# Patient Record
Sex: Male | Born: 1940 | Race: White | Hispanic: No | Marital: Married | Smoking: Former smoker
Health system: Southern US, Community
[De-identification: ages and names within clinical notes are randomized; demographics above are authoritative.]

## PROBLEM LIST (undated history)

## (undated) DIAGNOSIS — C801 Malignant (primary) neoplasm, unspecified: Secondary | ICD-10-CM

## (undated) DIAGNOSIS — Z87442 Personal history of urinary calculi: Secondary | ICD-10-CM

## (undated) DIAGNOSIS — E039 Hypothyroidism, unspecified: Secondary | ICD-10-CM

## (undated) DIAGNOSIS — I619 Nontraumatic intracerebral hemorrhage, unspecified: Secondary | ICD-10-CM

## (undated) DIAGNOSIS — M1990 Unspecified osteoarthritis, unspecified site: Secondary | ICD-10-CM

## (undated) DIAGNOSIS — M549 Dorsalgia, unspecified: Secondary | ICD-10-CM

## (undated) DIAGNOSIS — R011 Cardiac murmur, unspecified: Secondary | ICD-10-CM

## (undated) DIAGNOSIS — F0390 Unspecified dementia without behavioral disturbance: Secondary | ICD-10-CM

## (undated) HISTORY — DX: Dorsalgia, unspecified: M54.9

## (undated) HISTORY — PX: TONSILLECTOMY: SUR1361

## (undated) HISTORY — PX: INGUINAL HERNIA REPAIR: SUR1180

## (undated) HISTORY — DX: Nontraumatic intracerebral hemorrhage, unspecified: I61.9

## (undated) HISTORY — PX: BACK SURGERY: SHX140

## (undated) HISTORY — PX: NECK SURGERY: SHX720

---

## 2014-05-12 DIAGNOSIS — X32XXXA Exposure to sunlight, initial encounter: Secondary | ICD-10-CM | POA: Diagnosis not present

## 2014-05-12 DIAGNOSIS — L570 Actinic keratosis: Secondary | ICD-10-CM | POA: Diagnosis not present

## 2014-05-12 DIAGNOSIS — L821 Other seborrheic keratosis: Secondary | ICD-10-CM | POA: Diagnosis not present

## 2014-05-19 DIAGNOSIS — M19041 Primary osteoarthritis, right hand: Secondary | ICD-10-CM | POA: Diagnosis not present

## 2014-05-19 DIAGNOSIS — M19042 Primary osteoarthritis, left hand: Secondary | ICD-10-CM | POA: Diagnosis not present

## 2014-05-19 DIAGNOSIS — M65322 Trigger finger, left index finger: Secondary | ICD-10-CM | POA: Diagnosis not present

## 2014-05-31 DIAGNOSIS — R4182 Altered mental status, unspecified: Secondary | ICD-10-CM | POA: Diagnosis not present

## 2014-05-31 DIAGNOSIS — R410 Disorientation, unspecified: Secondary | ICD-10-CM | POA: Diagnosis not present

## 2014-05-31 DIAGNOSIS — E860 Dehydration: Secondary | ICD-10-CM | POA: Diagnosis not present

## 2014-05-31 DIAGNOSIS — G454 Transient global amnesia: Secondary | ICD-10-CM | POA: Diagnosis not present

## 2014-07-12 DIAGNOSIS — G5601 Carpal tunnel syndrome, right upper limb: Secondary | ICD-10-CM | POA: Diagnosis not present

## 2014-07-12 DIAGNOSIS — G061 Intraspinal abscess and granuloma: Secondary | ICD-10-CM | POA: Diagnosis not present

## 2014-07-12 DIAGNOSIS — M5032 Other cervical disc degeneration, mid-cervical region: Secondary | ICD-10-CM | POA: Diagnosis not present

## 2014-07-12 DIAGNOSIS — G959 Disease of spinal cord, unspecified: Secondary | ICD-10-CM | POA: Diagnosis not present

## 2014-07-12 DIAGNOSIS — Z981 Arthrodesis status: Secondary | ICD-10-CM | POA: Diagnosis not present

## 2014-07-26 DIAGNOSIS — H04123 Dry eye syndrome of bilateral lacrimal glands: Secondary | ICD-10-CM | POA: Diagnosis not present

## 2014-07-26 DIAGNOSIS — H524 Presbyopia: Secondary | ICD-10-CM | POA: Diagnosis not present

## 2014-09-10 DIAGNOSIS — M65312 Trigger thumb, left thumb: Secondary | ICD-10-CM | POA: Diagnosis not present

## 2014-10-13 DIAGNOSIS — D485 Neoplasm of uncertain behavior of skin: Secondary | ICD-10-CM | POA: Diagnosis not present

## 2014-10-13 DIAGNOSIS — L821 Other seborrheic keratosis: Secondary | ICD-10-CM | POA: Diagnosis not present

## 2015-01-19 DIAGNOSIS — E785 Hyperlipidemia, unspecified: Secondary | ICD-10-CM | POA: Diagnosis not present

## 2015-01-19 DIAGNOSIS — Z5181 Encounter for therapeutic drug level monitoring: Secondary | ICD-10-CM | POA: Diagnosis not present

## 2015-01-19 DIAGNOSIS — Z125 Encounter for screening for malignant neoplasm of prostate: Secondary | ICD-10-CM | POA: Diagnosis not present

## 2015-01-19 DIAGNOSIS — Z23 Encounter for immunization: Secondary | ICD-10-CM | POA: Diagnosis not present

## 2015-02-18 DIAGNOSIS — Z0000 Encounter for general adult medical examination without abnormal findings: Secondary | ICD-10-CM | POA: Diagnosis not present

## 2015-02-18 DIAGNOSIS — R0982 Postnasal drip: Secondary | ICD-10-CM | POA: Diagnosis not present

## 2015-02-18 DIAGNOSIS — R001 Bradycardia, unspecified: Secondary | ICD-10-CM | POA: Diagnosis not present

## 2015-02-18 DIAGNOSIS — Z125 Encounter for screening for malignant neoplasm of prostate: Secondary | ICD-10-CM | POA: Diagnosis not present

## 2015-02-18 DIAGNOSIS — R011 Cardiac murmur, unspecified: Secondary | ICD-10-CM | POA: Diagnosis not present

## 2015-06-15 DIAGNOSIS — M65322 Trigger finger, left index finger: Secondary | ICD-10-CM | POA: Diagnosis not present

## 2015-06-28 DIAGNOSIS — L981 Factitial dermatitis: Secondary | ICD-10-CM | POA: Diagnosis not present

## 2015-06-28 DIAGNOSIS — D0472 Carcinoma in situ of skin of left lower limb, including hip: Secondary | ICD-10-CM | POA: Diagnosis not present

## 2015-06-28 DIAGNOSIS — D1801 Hemangioma of skin and subcutaneous tissue: Secondary | ICD-10-CM | POA: Diagnosis not present

## 2015-06-28 DIAGNOSIS — X32XXXA Exposure to sunlight, initial encounter: Secondary | ICD-10-CM | POA: Diagnosis not present

## 2015-06-28 DIAGNOSIS — L570 Actinic keratosis: Secondary | ICD-10-CM | POA: Diagnosis not present

## 2015-07-11 DIAGNOSIS — L570 Actinic keratosis: Secondary | ICD-10-CM | POA: Diagnosis not present

## 2015-07-29 DIAGNOSIS — G5601 Carpal tunnel syndrome, right upper limb: Secondary | ICD-10-CM | POA: Diagnosis not present

## 2015-07-29 DIAGNOSIS — G061 Intraspinal abscess and granuloma: Secondary | ICD-10-CM | POA: Diagnosis not present

## 2015-08-12 DIAGNOSIS — L570 Actinic keratosis: Secondary | ICD-10-CM | POA: Diagnosis not present

## 2015-08-12 DIAGNOSIS — X32XXXA Exposure to sunlight, initial encounter: Secondary | ICD-10-CM | POA: Diagnosis not present

## 2015-09-14 DIAGNOSIS — H52223 Regular astigmatism, bilateral: Secondary | ICD-10-CM | POA: Diagnosis not present

## 2015-09-14 DIAGNOSIS — H524 Presbyopia: Secondary | ICD-10-CM | POA: Diagnosis not present

## 2015-09-14 DIAGNOSIS — H2513 Age-related nuclear cataract, bilateral: Secondary | ICD-10-CM | POA: Diagnosis not present

## 2015-09-14 DIAGNOSIS — H5203 Hypermetropia, bilateral: Secondary | ICD-10-CM | POA: Diagnosis not present

## 2016-01-02 DIAGNOSIS — Z136 Encounter for screening for cardiovascular disorders: Secondary | ICD-10-CM | POA: Diagnosis not present

## 2016-01-02 DIAGNOSIS — Z1211 Encounter for screening for malignant neoplasm of colon: Secondary | ICD-10-CM | POA: Diagnosis not present

## 2016-01-02 DIAGNOSIS — Z23 Encounter for immunization: Secondary | ICD-10-CM | POA: Diagnosis not present

## 2016-01-02 DIAGNOSIS — Z0000 Encounter for general adult medical examination without abnormal findings: Secondary | ICD-10-CM | POA: Diagnosis not present

## 2016-01-02 DIAGNOSIS — M6530 Trigger finger, unspecified finger: Secondary | ICD-10-CM | POA: Diagnosis not present

## 2016-01-02 DIAGNOSIS — M25561 Pain in right knee: Secondary | ICD-10-CM | POA: Diagnosis not present

## 2016-01-02 DIAGNOSIS — Z125 Encounter for screening for malignant neoplasm of prostate: Secondary | ICD-10-CM | POA: Diagnosis not present

## 2016-01-02 DIAGNOSIS — Z131 Encounter for screening for diabetes mellitus: Secondary | ICD-10-CM | POA: Diagnosis not present

## 2016-01-05 DIAGNOSIS — M1812 Unilateral primary osteoarthritis of first carpometacarpal joint, left hand: Secondary | ICD-10-CM | POA: Diagnosis not present

## 2016-01-05 DIAGNOSIS — M1711 Unilateral primary osteoarthritis, right knee: Secondary | ICD-10-CM | POA: Diagnosis not present

## 2016-01-05 DIAGNOSIS — M11261 Other chondrocalcinosis, right knee: Secondary | ICD-10-CM | POA: Diagnosis not present

## 2016-01-05 DIAGNOSIS — M65312 Trigger thumb, left thumb: Secondary | ICD-10-CM | POA: Diagnosis not present

## 2016-02-06 DIAGNOSIS — Z1211 Encounter for screening for malignant neoplasm of colon: Secondary | ICD-10-CM | POA: Diagnosis not present

## 2016-04-30 DIAGNOSIS — M1711 Unilateral primary osteoarthritis, right knee: Secondary | ICD-10-CM | POA: Diagnosis not present

## 2016-05-31 DIAGNOSIS — J329 Chronic sinusitis, unspecified: Secondary | ICD-10-CM | POA: Diagnosis not present

## 2016-05-31 DIAGNOSIS — R42 Dizziness and giddiness: Secondary | ICD-10-CM | POA: Diagnosis not present

## 2016-08-06 DIAGNOSIS — M1711 Unilateral primary osteoarthritis, right knee: Secondary | ICD-10-CM | POA: Diagnosis not present

## 2016-08-06 DIAGNOSIS — M5136 Other intervertebral disc degeneration, lumbar region: Secondary | ICD-10-CM | POA: Diagnosis not present

## 2016-08-06 DIAGNOSIS — M545 Low back pain: Secondary | ICD-10-CM | POA: Diagnosis not present

## 2016-08-08 DIAGNOSIS — M545 Low back pain: Secondary | ICD-10-CM | POA: Diagnosis not present

## 2016-08-10 DIAGNOSIS — M545 Low back pain: Secondary | ICD-10-CM | POA: Diagnosis not present

## 2016-08-14 DIAGNOSIS — M545 Low back pain: Secondary | ICD-10-CM | POA: Diagnosis not present

## 2016-08-21 DIAGNOSIS — M545 Low back pain: Secondary | ICD-10-CM | POA: Diagnosis not present

## 2016-08-24 DIAGNOSIS — M545 Low back pain: Secondary | ICD-10-CM | POA: Diagnosis not present

## 2016-08-28 DIAGNOSIS — M545 Low back pain: Secondary | ICD-10-CM | POA: Diagnosis not present

## 2016-08-31 DIAGNOSIS — M545 Low back pain: Secondary | ICD-10-CM | POA: Diagnosis not present

## 2016-09-03 DIAGNOSIS — Q828 Other specified congenital malformations of skin: Secondary | ICD-10-CM | POA: Diagnosis not present

## 2016-09-03 DIAGNOSIS — H61002 Unspecified perichondritis of left external ear: Secondary | ICD-10-CM | POA: Diagnosis not present

## 2016-09-03 DIAGNOSIS — L603 Nail dystrophy: Secondary | ICD-10-CM | POA: Diagnosis not present

## 2016-09-03 DIAGNOSIS — L570 Actinic keratosis: Secondary | ICD-10-CM | POA: Diagnosis not present

## 2016-09-03 DIAGNOSIS — D1801 Hemangioma of skin and subcutaneous tissue: Secondary | ICD-10-CM | POA: Diagnosis not present

## 2016-09-03 DIAGNOSIS — L821 Other seborrheic keratosis: Secondary | ICD-10-CM | POA: Diagnosis not present

## 2016-09-03 DIAGNOSIS — D485 Neoplasm of uncertain behavior of skin: Secondary | ICD-10-CM | POA: Diagnosis not present

## 2016-09-19 DIAGNOSIS — M545 Low back pain: Secondary | ICD-10-CM | POA: Diagnosis not present

## 2016-09-21 DIAGNOSIS — H5202 Hypermetropia, left eye: Secondary | ICD-10-CM | POA: Diagnosis not present

## 2016-09-21 DIAGNOSIS — H2513 Age-related nuclear cataract, bilateral: Secondary | ICD-10-CM | POA: Diagnosis not present

## 2016-09-21 DIAGNOSIS — H52223 Regular astigmatism, bilateral: Secondary | ICD-10-CM | POA: Diagnosis not present

## 2016-10-02 DIAGNOSIS — M5136 Other intervertebral disc degeneration, lumbar region: Secondary | ICD-10-CM | POA: Diagnosis not present

## 2016-10-02 DIAGNOSIS — M1711 Unilateral primary osteoarthritis, right knee: Secondary | ICD-10-CM | POA: Diagnosis not present

## 2016-10-02 DIAGNOSIS — M11261 Other chondrocalcinosis, right knee: Secondary | ICD-10-CM | POA: Diagnosis not present

## 2016-10-29 DIAGNOSIS — L905 Scar conditions and fibrosis of skin: Secondary | ICD-10-CM | POA: Diagnosis not present

## 2016-10-29 DIAGNOSIS — H61002 Unspecified perichondritis of left external ear: Secondary | ICD-10-CM | POA: Diagnosis not present

## 2016-10-30 DIAGNOSIS — L905 Scar conditions and fibrosis of skin: Secondary | ICD-10-CM | POA: Diagnosis not present

## 2016-10-30 DIAGNOSIS — H61002 Unspecified perichondritis of left external ear: Secondary | ICD-10-CM | POA: Diagnosis not present

## 2016-11-28 DIAGNOSIS — M1711 Unilateral primary osteoarthritis, right knee: Secondary | ICD-10-CM | POA: Diagnosis not present

## 2016-12-27 DIAGNOSIS — M1711 Unilateral primary osteoarthritis, right knee: Secondary | ICD-10-CM | POA: Diagnosis not present

## 2017-01-08 DIAGNOSIS — Z23 Encounter for immunization: Secondary | ICD-10-CM | POA: Diagnosis not present

## 2017-01-08 DIAGNOSIS — Z0000 Encounter for general adult medical examination without abnormal findings: Secondary | ICD-10-CM | POA: Diagnosis not present

## 2017-01-08 DIAGNOSIS — E785 Hyperlipidemia, unspecified: Secondary | ICD-10-CM | POA: Diagnosis not present

## 2017-01-08 DIAGNOSIS — Z125 Encounter for screening for malignant neoplasm of prostate: Secondary | ICD-10-CM | POA: Diagnosis not present

## 2017-01-29 DIAGNOSIS — M1711 Unilateral primary osteoarthritis, right knee: Secondary | ICD-10-CM | POA: Diagnosis not present

## 2017-02-04 DIAGNOSIS — L570 Actinic keratosis: Secondary | ICD-10-CM | POA: Diagnosis not present

## 2017-02-05 DIAGNOSIS — M1711 Unilateral primary osteoarthritis, right knee: Secondary | ICD-10-CM | POA: Diagnosis not present

## 2017-02-12 DIAGNOSIS — M1711 Unilateral primary osteoarthritis, right knee: Secondary | ICD-10-CM | POA: Diagnosis not present

## 2017-02-28 DIAGNOSIS — M1711 Unilateral primary osteoarthritis, right knee: Secondary | ICD-10-CM | POA: Diagnosis not present

## 2017-03-20 DIAGNOSIS — M1711 Unilateral primary osteoarthritis, right knee: Secondary | ICD-10-CM | POA: Diagnosis not present

## 2017-03-29 DIAGNOSIS — R011 Cardiac murmur, unspecified: Secondary | ICD-10-CM | POA: Diagnosis not present

## 2017-03-29 DIAGNOSIS — Z01818 Encounter for other preprocedural examination: Secondary | ICD-10-CM | POA: Diagnosis not present

## 2017-03-29 DIAGNOSIS — M179 Osteoarthritis of knee, unspecified: Secondary | ICD-10-CM | POA: Diagnosis not present

## 2017-04-03 ENCOUNTER — Telehealth: Payer: Self-pay

## 2017-04-03 NOTE — Telephone Encounter (Signed)
SENT NOTES TO SCHEDULING 

## 2017-04-05 NOTE — Progress Notes (Signed)
Referring-Dr Orland Mustard Reason for referral-murmur and preoperative evaluation  HPI: 76 year old male for evaluation of murmur and preoperative evaluation at the request of Dr. Orland Mustard. Patient is scheduled for knee replacement. We were asked to evaluate prior to this. He developed knee arthralgias in July. This has limited his mobility somewhat. However prior to that he was walking 4 miles daily with no symptoms. He denies dyspnea on exertion, orthopnea, PND, pedal edema, palpitations, syncope or chest pain.  Current Outpatient Medications  Medication Sig Dispense Refill  . acetaminophen (TYLENOL 8 HOUR) 650 MG CR tablet Take 650 mg by mouth every 8 (eight) hours as needed for pain.    . Ibuprofen (IBU-200 PO) Take 200 mg by mouth as needed.    . naproxen (NAPROSYN) 500 MG tablet Take 500 mg by mouth 2 (two) times daily.  2  . omeprazole (PRILOSEC) 20 MG capsule Take 20 mg by mouth 2 (two) times daily.  2   No current facility-administered medications for this visit.     Not on File   Past Medical History:  Diagnosis Date  . Back pain   . ICH (intracerebral hemorrhage) ([place])     Past Surgical History:  Procedure Laterality Date  . BACK SURGERY    . INGUINAL HERNIA REPAIR    . NECK SURGERY    . TONSILLECTOMY      Social History   Socioeconomic History  . Marital status: Married    Spouse name: Not on file  . Number of children: 3  . Years of education: Not on file  . Highest education level: Not on file  Social Needs  . Financial resource strain: Not on file  . Food insecurity - worry: Not on file  . Food insecurity - inability: Not on file  . Transportation needs - medical: Not on file  . Transportation needs - non-medical: Not on file  Occupational History    Comment: Retired  Tobacco Use  . Smoking status: Former Research scientist (life sciences)  . Smokeless tobacco: Never Used  Substance and Sexual Activity  . Alcohol use: Yes    Frequency: Never    Comment: one beer per day  . Drug  use: No  . Sexual activity: Not on file  Other Topics Concern  . Not on file  Social History Narrative  . Not on file    Family History  Problem Relation Age of Onset  . Diabetes Mother     ROS:  Knee arthralgias but no fevers or chills, productive cough, hemoptysis, dysphasia, odynophagia, melena, hematochezia, dysuria, hematuria, rash, seizure activity, orthopnea, PND, pedal edema, claudication. Remaining systems are negative.  Physical Exam:   Blood pressure (!) 148/80, pulse 62, height 5\' 7"  (1.702 m), weight 201 lb (91.2 kg).  General:  Well developed/well nourished in NAD Skin warm/dry Patient not depressed No peripheral clubbing Back-normal HEENT-normal/normal eyelids Neck supple/normal carotid upstroke bilaterally; no bruits; no JVD; no thyromegaly chest - CTA/ normal expansion CV - RRR/normal S1 and S2; no rubs or gallops;  PMI nondisplaced, 2/6 systolic murmur left sternal border. S2 is not diminished. Abdomen -NT/ND, no HSM, no mass, + bowel sounds, positive bruit 2+ femoral pulses, no bruits Ext-no edema, chords, 2+ DP Neuro-grossly nonfocal  ECG - sinus rhythm at a rate of 62. No significant ST changes.personally reviewed  A/P  1 preoperative evaluation prior to knee replacement-we will arrange an echocardiogram to rule out significant valvular disease. He otherwise has a history of excellent functional capacity with no symptoms.  He does not have risk factors of diabetes mellitus, hypertension, hyperlipidemia, tobacco abuse or family history. If echocardiogram unremarkable he does not require further ischemia evaluation and may proceed with surgery.  2 murmur-probable aortic stenosis murmur. However S2 is not diminished. Unlikely to be severe. Echocardiogram to further assess.  3 abdominal bruit-schedule ultrasound to exclude aneurysm.  4 elevated blood pressure-blood pressure mildly elevated today but typically controlled at home and no history of  hypertension. We will follow for now.  5 Knee arthralgias-per orthopedic surgery  Kirk Ruths, MD

## 2017-04-09 ENCOUNTER — Telehealth: Payer: Self-pay

## 2017-04-09 NOTE — Telephone Encounter (Signed)
Ov 04/18/17 for clearance

## 2017-04-09 NOTE — Telephone Encounter (Signed)
I have never seen this pt; will need ov prior to surgery with pa or cardiologist Kirk Ruths

## 2017-04-09 NOTE — Telephone Encounter (Signed)
Pt has New pt appt with Dr Stanford Breed 04-18-2017     [facility] Pre-operative Risk Assessment    Request for surgical clearance:  1. What type of surgery is being performed? RIGHT KNEE: RIGHT TKA w/NAVIGATION, CAS IMAGE-LESS  2. When is this surgery scheduled? NOT SCHEDULED   3. Are there any medications that need to be held prior to surgery and how long?NO COAGULANTS-TO THEIR KNOWLEDGE    4. Practice name and name of physician performing surgery?  [facility]-Dr [employer]  5. What is your office phone and fax number? [phone] FAX [phone]   6. Anesthesia type (None, local, MAC, general) ? SPINAL    Patrick Collier 04/09/2017, 3:37 PM  _________________________________________________________________   (provider comments below)

## 2017-04-18 ENCOUNTER — Encounter (INDEPENDENT_AMBULATORY_CARE_PROVIDER_SITE_OTHER): Payer: Self-pay

## 2017-04-18 ENCOUNTER — Encounter: Payer: Self-pay | Admitting: Cardiology

## 2017-04-18 ENCOUNTER — Ambulatory Visit (INDEPENDENT_AMBULATORY_CARE_PROVIDER_SITE_OTHER): Payer: Medicare Other | Admitting: Cardiology

## 2017-04-18 VITALS — BP 148/80 | HR 62 | Ht 67.0 in | Wt 201.0 lb

## 2017-04-18 DIAGNOSIS — Z87891 Personal history of nicotine dependence: Secondary | ICD-10-CM

## 2017-04-18 DIAGNOSIS — R011 Cardiac murmur, unspecified: Secondary | ICD-10-CM

## 2017-04-18 DIAGNOSIS — R0989 Other specified symptoms and signs involving the circulatory and respiratory systems: Secondary | ICD-10-CM | POA: Diagnosis not present

## 2017-04-18 DIAGNOSIS — Z01818 Encounter for other preprocedural examination: Secondary | ICD-10-CM | POA: Diagnosis not present

## 2017-04-18 NOTE — Patient Instructions (Signed)
Medication Instructions:   NO CHANGE  Testing/Procedures:  Your physician has requested that you have an echocardiogram. Echocardiography is a painless test that uses sound waves to create images of your heart. It provides your doctor with information about the size and shape of your heart and how well your heart's chambers and valves are working. This procedure takes approximately one hour. There are no restrictions for this procedure.SCHEDULE ASAP FOR PRE-OP TESTING  Your physician has requested that you have an abdominal aorta duplex. During this test, an ultrasound is used to evaluate the aorta. Allow 30 minutes for this exam. Do not eat after midnight the day before and avoid carbonated beverages     Follow-Up:  Your physician wants you to follow-up in: [place] will receive a reminder letter in the mail two months in advance. If you don't receive a letter, please call our office to schedule the follow-up appointment.

## 2017-04-19 ENCOUNTER — Ambulatory Visit (HOSPITAL_COMMUNITY): Payer: Medicare Other | Attending: Internal Medicine

## 2017-04-19 ENCOUNTER — Other Ambulatory Visit: Payer: Self-pay

## 2017-04-19 DIAGNOSIS — I081 Rheumatic disorders of both mitral and tricuspid valves: Secondary | ICD-10-CM | POA: Insufficient documentation

## 2017-04-19 DIAGNOSIS — Z01818 Encounter for other preprocedural examination: Secondary | ICD-10-CM | POA: Diagnosis not present

## 2017-04-19 DIAGNOSIS — R011 Cardiac murmur, unspecified: Secondary | ICD-10-CM | POA: Diagnosis not present

## 2017-04-22 ENCOUNTER — Other Ambulatory Visit (HOSPITAL_COMMUNITY): Payer: Medicare Other

## 2017-04-26 ENCOUNTER — Ambulatory Visit: Payer: Self-pay | Admitting: Orthopedic Surgery

## 2017-04-30 ENCOUNTER — Ambulatory Visit: Payer: Self-pay | Admitting: Orthopedic Surgery

## 2017-04-30 NOTE — H&P (View-Only) (Signed)
TOTAL KNEE ADMISSION H&P  Patient is being admitted for right total knee arthroplasty.  Subjective:  Chief Complaint:right knee pain.  HPI: Patrick Collier, 77 y.o. male, has a history of pain and functional disability in the right knee due to arthritis and has failed non-surgical conservative treatments for greater than 12 weeks to includeNSAID's and/or analgesics, corticosteriod injections, flexibility and strengthening excercises, use of assistive devices, weight reduction as appropriate and activity modification.  Onset of symptoms was gradual, starting >10 years ago with rapidlly worsening course since that time. The patient noted no past surgery on the right knee(s).  Patient currently rates pain in the right knee(s) at 10 out of 10 with activity. Patient has night pain, worsening of pain with activity and weight bearing, pain that interferes with activities of daily living, pain with passive range of motion, crepitus and joint swelling.  Patient has evidence of subchondral cysts, subchondral sclerosis, periarticular osteophytes and joint space narrowing by imaging studies.  There is no active infection.  There are no active problems to display for this patient.  Past Medical History:  Diagnosis Date  . Back pain   . ICH (intracerebral hemorrhage) ([place])     Past Surgical History:  Procedure Laterality Date  . BACK SURGERY    . INGUINAL HERNIA REPAIR    . NECK SURGERY    . TONSILLECTOMY      Current Outpatient Medications  Medication Sig Dispense Refill Last Dose  . acetaminophen (TYLENOL 8 HOUR) 650 MG CR tablet Take 650 mg by mouth every 8 (eight) hours as needed for pain.   Taking  . Ibuprofen (IBU-200 PO) Take 400-800 mg by mouth every 8 (eight) hours as needed (depends on pain if takes 2-4 tablets).    Taking  . naproxen (NAPROSYN) 500 MG tablet Take 500 mg by mouth 2 (two) times daily.  2 Taking  . omeprazole (PRILOSEC) 20 MG capsule Take 20 mg by mouth 2 (two) times daily.   2 Taking   No current facility-administered medications for this visit.    No Known Allergies  Social History   Tobacco Use  . Smoking status: Former Research scientist (life sciences)  . Smokeless tobacco: Never Used  Substance Use Topics  . Alcohol use: Yes    Frequency: Never    Comment: one beer per day    Family History  Problem Relation Age of Onset  . Diabetes Mother      Review of Systems  Constitutional: Negative.   HENT: Negative.   Eyes: Negative.   Respiratory: Negative.   Cardiovascular: Negative.   Gastrointestinal: Negative.   Genitourinary: Positive for frequency.  Musculoskeletal: Positive for joint pain.  Skin: Negative.   Neurological: Negative.   Endo/Heme/Allergies: Negative.   Psychiatric/Behavioral: Negative.     Objective:  Physical Exam  Vitals reviewed. Constitutional: He is oriented to person, place, and time. He appears well-developed and well-nourished.  HENT:  Head: Normocephalic and atraumatic.  Eyes: Conjunctivae and EOM are normal. Pupils are equal, round, and reactive to light.  Neck: Normal range of motion. Neck supple.  Cardiovascular: Normal rate and normal heart sounds.  Respiratory: Effort normal. No respiratory distress.  GI: Soft. He exhibits no distension.  Genitourinary:  Genitourinary Comments: deferred  Musculoskeletal:       Right knee: He exhibits decreased range of motion, swelling, effusion, deformity and bony tenderness. Tenderness found. Medial joint line tenderness noted.  Neurological: He is alert and oriented to person, place, and time. He has normal reflexes.  Skin: Skin is warm and dry.  Psychiatric: He has a normal mood and affect. His behavior is normal. Judgment and thought content normal.    Vital signs in last 24 hours: @VSRANGES @  Labs:   Estimated body mass index is 31.48 kg/m as calculated from the following:   Height as of 04/18/17: 5\' 7"  (1.702 m).   Weight as of 04/18/17: 91.2 kg (201 lb).   Imaging  Review Plain radiographs demonstrate severe degenerative joint disease of the right knee(s). The overall alignment issignificant varus. The bone quality appears to be adequate for age and reported activity level.  Assessment/Plan:  End stage arthritis, right knee   The patient history, physical examination, clinical judgment of the provider and imaging studies are consistent with end stage degenerative joint disease of the right knee(s) and total knee arthroplasty is deemed medically necessary. The treatment options including medical management, injection therapy arthroscopy and arthroplasty were discussed at length. The risks and benefits of total knee arthroplasty were presented and reviewed. The risks due to aseptic loosening, infection, stiffness, patella tracking problems, thromboembolic complications and other imponderables were discussed. The patient acknowledged the explanation, agreed to proceed with the plan and consent was signed. Patient is being admitted for inpatient treatment for surgery, pain control, PT, OT, prophylactic antibiotics, VTE prophylaxis, progressive ambulation and ADL's and discharge planning. The patient is planning to be discharged home with outpatient PT

## 2017-04-30 NOTE — H&P (Signed)
TOTAL KNEE ADMISSION H&P  Patient is being admitted for right total knee arthroplasty.  Subjective:  Chief Complaint:right knee pain.  HPI: Patrick Collier, 77 y.o. male, has a history of pain and functional disability in the right knee due to arthritis and has failed non-surgical conservative treatments for greater than 12 weeks to includeNSAID's and/or analgesics, corticosteriod injections, flexibility and strengthening excercises, use of assistive devices, weight reduction as appropriate and activity modification.  Onset of symptoms was gradual, starting >10 years ago with rapidlly worsening course since that time. The patient noted no past surgery on the right knee(s).  Patient currently rates pain in the right knee(s) at 10 out of 10 with activity. Patient has night pain, worsening of pain with activity and weight bearing, pain that interferes with activities of daily living, pain with passive range of motion, crepitus and joint swelling.  Patient has evidence of subchondral cysts, subchondral sclerosis, periarticular osteophytes and joint space narrowing by imaging studies.  There is no active infection.  There are no active problems to display for this patient.  Past Medical History:  Diagnosis Date  . Back pain   . ICH (intracerebral hemorrhage) ([place])     Past Surgical History:  Procedure Laterality Date  . BACK SURGERY    . INGUINAL HERNIA REPAIR    . NECK SURGERY    . TONSILLECTOMY      Current Outpatient Medications  Medication Sig Dispense Refill Last Dose  . acetaminophen (TYLENOL 8 HOUR) 650 MG CR tablet Take 650 mg by mouth every 8 (eight) hours as needed for pain.   Taking  . Ibuprofen (IBU-200 PO) Take 400-800 mg by mouth every 8 (eight) hours as needed (depends on pain if takes 2-4 tablets).    Taking  . naproxen (NAPROSYN) 500 MG tablet Take 500 mg by mouth 2 (two) times daily.  2 Taking  . omeprazole (PRILOSEC) 20 MG capsule Take 20 mg by mouth 2 (two) times daily.   2 Taking   No current facility-administered medications for this visit.    No Known Allergies  Social History   Tobacco Use  . Smoking status: Former Research scientist (life sciences)  . Smokeless tobacco: Never Used  Substance Use Topics  . Alcohol use: Yes    Frequency: Never    Comment: one beer per day    Family History  Problem Relation Age of Onset  . Diabetes Mother      Review of Systems  Constitutional: Negative.   HENT: Negative.   Eyes: Negative.   Respiratory: Negative.   Cardiovascular: Negative.   Gastrointestinal: Negative.   Genitourinary: Positive for frequency.  Musculoskeletal: Positive for joint pain.  Skin: Negative.   Neurological: Negative.   Endo/Heme/Allergies: Negative.   Psychiatric/Behavioral: Negative.     Objective:  Physical Exam  Vitals reviewed. Constitutional: He is oriented to person, place, and time. He appears well-developed and well-nourished.  HENT:  Head: Normocephalic and atraumatic.  Eyes: Conjunctivae and EOM are normal. Pupils are equal, round, and reactive to light.  Neck: Normal range of motion. Neck supple.  Cardiovascular: Normal rate and normal heart sounds.  Respiratory: Effort normal. No respiratory distress.  GI: Soft. He exhibits no distension.  Genitourinary:  Genitourinary Comments: deferred  Musculoskeletal:       Right knee: He exhibits decreased range of motion, swelling, effusion, deformity and bony tenderness. Tenderness found. Medial joint line tenderness noted.  Neurological: He is alert and oriented to person, place, and time. He has normal reflexes.  Skin: Skin is warm and dry.  Psychiatric: He has a normal mood and affect. His behavior is normal. Judgment and thought content normal.    Vital signs in last 24 hours: @VSRANGES @  Labs:   Estimated body mass index is 31.48 kg/m as calculated from the following:   Height as of 04/18/17: 5\' 7"  (1.702 m).   Weight as of 04/18/17: 91.2 kg (201 lb).   Imaging  Review Plain radiographs demonstrate severe degenerative joint disease of the right knee(s). The overall alignment issignificant varus. The bone quality appears to be adequate for age and reported activity level.  Assessment/Plan:  End stage arthritis, right knee   The patient history, physical examination, clinical judgment of the provider and imaging studies are consistent with end stage degenerative joint disease of the right knee(s) and total knee arthroplasty is deemed medically necessary. The treatment options including medical management, injection therapy arthroscopy and arthroplasty were discussed at length. The risks and benefits of total knee arthroplasty were presented and reviewed. The risks due to aseptic loosening, infection, stiffness, patella tracking problems, thromboembolic complications and other imponderables were discussed. The patient acknowledged the explanation, agreed to proceed with the plan and consent was signed. Patient is being admitted for inpatient treatment for surgery, pain control, PT, OT, prophylactic antibiotics, VTE prophylaxis, progressive ambulation and ADL's and discharge planning. The patient is planning to be discharged home with outpatient PT

## 2017-05-06 ENCOUNTER — Ambulatory Visit (HOSPITAL_COMMUNITY)
Admission: RE | Admit: 2017-05-06 | Discharge: 2017-05-06 | Disposition: A | Discharge status: Home / Self Care | Payer: Medicare Other | Source: Ambulatory Visit | Attending: Internal Medicine | Admitting: Internal Medicine

## 2017-05-06 DIAGNOSIS — Z01818 Encounter for other preprocedural examination: Secondary | ICD-10-CM | POA: Diagnosis not present

## 2017-05-06 DIAGNOSIS — Z87891 Personal history of nicotine dependence: Secondary | ICD-10-CM | POA: Diagnosis not present

## 2017-05-06 DIAGNOSIS — R0989 Other specified symptoms and signs involving the circulatory and respiratory systems: Secondary | ICD-10-CM | POA: Diagnosis not present

## 2017-05-08 NOTE — Patient Instructions (Signed)
Patrick Collier  05/08/2017   Your procedure is scheduled on: 05/16/2017   Report to [facility] Main  Entrance  Report to admitting at    0800 AM   Call this number if you have problems the morning of surgery [phone]   Remember: Do not eat food or drink liquids :After Midnight.     Take these medicines the morning of surgery with A SIP OF WATER: Prilosec                                 You may not have any metal on your body including hair pins and              piercings  Do not wear jewelry, , lotions, powders or perfumes, deodorant                          Men may shave face and neck.   Do not bring valuables to the hospital. [place].  Contacts, dentures or bridgework may not be worn into surgery.  Leave suitcase in the car. After surgery it may be brought to your room.                       Please read over the following fact sheets you were given: _____________________________________________________________________             [facility] - Preparing for Surgery Before surgery, you can play an important role.  Because skin is not sterile, your skin needs to be as free of germs as possible.  You can reduce the number of germs on your skin by washing with CHG (chlorahexidine gluconate) soap before surgery.  CHG is an antiseptic cleaner which kills germs and bonds with the skin to continue killing germs even after washing. Please DO NOT use if you have an allergy to CHG or antibacterial soaps.  If your skin becomes reddened/irritated stop using the CHG and inform your nurse when you arrive at Short Stay. Do not shave (including legs and underarms) for at least 48 hours prior to the first CHG shower.  You may shave your face/neck. Please follow these instructions carefully:  1.  Shower with CHG Soap the night before surgery and the  morning of Surgery.  2.  If you choose to wash your hair, wash  your hair first as usual with your  normal  shampoo.  3.  After you shampoo, rinse your hair and body thoroughly to remove the  shampoo.                           4.  Use CHG as you would any other liquid soap.  You can apply chg directly  to the skin and wash                       Gently with a scrungie or clean washcloth.  5.  Apply the CHG Soap to your body ONLY FROM THE NECK DOWN.   Do not use on face/ open  Wound or open sores. Avoid contact with eyes, ears mouth and genitals (private parts).                       Wash face,  Genitals (private parts) with your normal soap.             6.  Wash thoroughly, paying special attention to the area where your surgery  will be performed.  7.  Thoroughly rinse your body with warm water from the neck down.  8.  DO NOT shower/wash with your normal soap after using and rinsing off  the CHG Soap.                9.  Pat yourself dry with a clean towel.            10.  Wear clean pajamas.            11.  Place clean sheets on your bed the night of your first shower and do not  sleep with pets. Day of Surgery : Do not apply any lotions/deodorants the morning of surgery.  Please wear clean clothes to the hospital/surgery center.  FAILURE TO FOLLOW THESE INSTRUCTIONS MAY RESULT IN THE CANCELLATION OF YOUR SURGERY PATIENT SIGNATURE_________________________________  NURSE SIGNATURE__________________________________  ________________________________________________________________________  WHAT IS A BLOOD TRANSFUSION? Blood Transfusion Information  A transfusion is the replacement of blood or some of its parts. Blood is made up of multiple cells which provide different functions.  Red blood cells carry oxygen and are used for blood loss replacement.  White blood cells fight against infection.  Platelets control bleeding.  Plasma helps clot blood.  Other blood products are available for specialized needs, such as hemophilia  or other clotting disorders. BEFORE THE TRANSFUSION  Who gives blood for transfusions?   Healthy volunteers who are fully evaluated to make sure their blood is safe. This is blood bank blood. Transfusion therapy is the safest it has ever been in the practice of medicine. Before blood is taken from a donor, a complete history is taken to make sure that person has no history of diseases nor engages in risky social behavior (examples are intravenous drug use or sexual activity with multiple partners). The donor's travel history is screened to minimize risk of transmitting infections, such as malaria. The donated blood is tested for signs of infectious diseases, such as HIV and hepatitis. The blood is then tested to be sure it is compatible with you in order to minimize the chance of a transfusion reaction. If you or a relative donates blood, this is often done in anticipation of surgery and is not appropriate for emergency situations. It takes many days to process the donated blood. RISKS AND COMPLICATIONS Although transfusion therapy is very safe and saves many lives, the main dangers of transfusion include:   Getting an infectious disease.  Developing a transfusion reaction. This is an allergic reaction to something in the blood you were given. Every precaution is taken to prevent this. The decision to have a blood transfusion has been considered carefully by your caregiver before blood is given. Blood is not given unless the benefits outweigh the risks. AFTER THE TRANSFUSION  Right after receiving a blood transfusion, you will usually feel much better and more energetic. This is especially true if your red blood cells have gotten low (anemic). The transfusion raises the level of the red blood cells which carry oxygen, and this usually causes an energy increase.  The  nurse administering the transfusion will monitor you carefully for complications. HOME CARE INSTRUCTIONS  No special instructions are  needed after a transfusion. You may find your energy is better. Speak with your caregiver about any limitations on activity for underlying diseases you may have. SEEK MEDICAL CARE IF:   Your condition is not improving after your transfusion.  You develop redness or irritation at the intravenous (IV) site. SEEK IMMEDIATE MEDICAL CARE IF:  Any of the following symptoms occur over the next 12 hours:  Shaking chills.  You have a temperature by mouth above 102 F (38.9 C), not controlled by medicine.  Chest, back, or muscle pain.  People around you feel you are not acting correctly or are confused.  Shortness of breath or difficulty breathing.  Dizziness and fainting.  You get a rash or develop hives.  You have a decrease in urine output.  Your urine turns a dark color or changes to pink, red, or brown. Any of the following symptoms occur over the next 10 days:  You have a temperature by mouth above 102 F (38.9 C), not controlled by medicine.  Shortness of breath.  Weakness after normal activity.  The [place]roz part of the eye turns yellow (jaundice).  You have a decrease in the amount of urine or are urinating less often.  Your urine turns a dark color or changes to pink, red, or brown. Document Released: 04/06/2000 Document Revised: 07/02/2011 Document Reviewed: 11/24/2007 ExitCare Patient Information 2014 [place].  _______________________________________________________________________  Incentive Spirometer  An incentive spirometer is a tool that can help keep your lungs clear and active. This tool measures how well you are filling your lungs with each breath. Taking long deep breaths may help reverse or decrease the chance of developing breathing (pulmonary) problems (especially infection) following:  A long period of time when you are unable to move or be active. BEFORE THE PROCEDURE   If the spirometer includes an indicator to show your best effort, your  nurse or respiratory therapist will set it to a desired goal.  If possible, sit up straight or lean slightly forward. Try not to slouch.  Hold the incentive spirometer in an upright position. INSTRUCTIONS FOR USE  1. Sit on the edge of your bed if possible, or sit up as far as you can in bed or on a chair. 2. Hold the incentive spirometer in an upright position. 3. Breathe out normally. 4. Place the mouthpiece in your mouth and seal your lips tightly around it. 5. Breathe in slowly and as deeply as possible, raising the piston or the ball toward the top of the column. 6. Hold your breath for 3-5 seconds or for as long as possible. Allow the piston or ball to fall to the bottom of the column. 7. Remove the mouthpiece from your mouth and breathe out normally. 8. Rest for a few seconds and repeat Steps 1 through 7 at least 10 times every 1-2 hours when you are awake. Take your time and take a few normal breaths between deep breaths. 9. The spirometer may include an indicator to show your best effort. Use the indicator as a goal to work toward during each repetition. 10. After each set of 10 deep breaths, practice coughing to be sure your lungs are clear. If you have an incision (the cut made at the time of surgery), support your incision when coughing by placing a pillow or rolled up towels firmly against it. Once you are able to get  out of bed, walk around indoors and cough well. You may stop using the incentive spirometer when instructed by your caregiver.  RISKS AND COMPLICATIONS  Take your time so you do not get dizzy or light-headed.  If you are in pain, you may need to take or ask for pain medication before doing incentive spirometry. It is harder to take a deep breath if you are having pain. AFTER USE  Rest and breathe slowly and easily.  It can be helpful to keep track of a log of your progress. Your caregiver can provide you with a simple table to help with this. If you are using the  spirometer at home, follow these instructions: [place] IF:   You are having difficultly using the spirometer.  You have trouble using the spirometer as often as instructed.  Your pain medication is not giving enough relief while using the spirometer.  You develop fever of 100.5 F (38.1 C) or higher. SEEK IMMEDIATE MEDICAL CARE IF:   You cough up bloody sputum that had not been present before.  You develop fever of 102 F (38.9 C) or greater.  You develop worsening pain at or near the incision site. MAKE SURE YOU:   Understand these instructions.  Will watch your condition.  Will get help right away if you are not doing well or get worse. Document Released: 08/20/2006 Document Revised: 07/02/2011 Document Reviewed: 10/21/2006 [facility] Patient Information 2014 [place], [place].   ________________________________________________________________________

## 2017-05-09 ENCOUNTER — Other Ambulatory Visit: Payer: Self-pay

## 2017-05-09 ENCOUNTER — Encounter (HOSPITAL_COMMUNITY)
Admission: RE | Admit: 2017-05-09 | Discharge: 2017-05-09 | Disposition: A | Discharge status: Home / Self Care | Payer: Medicare Other | Source: Ambulatory Visit | Attending: Orthopedic Surgery | Admitting: Orthopedic Surgery

## 2017-05-09 ENCOUNTER — Encounter (HOSPITAL_COMMUNITY): Payer: Self-pay

## 2017-05-09 DIAGNOSIS — Z01812 Encounter for preprocedural laboratory examination: Secondary | ICD-10-CM | POA: Diagnosis not present

## 2017-05-09 DIAGNOSIS — M1711 Unilateral primary osteoarthritis, right knee: Secondary | ICD-10-CM | POA: Diagnosis not present

## 2017-05-09 HISTORY — DX: Personal history of urinary calculi: Z87.442

## 2017-05-09 HISTORY — DX: Unspecified osteoarthritis, unspecified site: M19.90

## 2017-05-09 HISTORY — DX: Cardiac murmur, unspecified: R01.1

## 2017-05-09 LAB — BASIC METABOLIC PANEL
Anion gap: 6 (ref 5–15)
BUN: 19 mg/dL (ref 6–20)
CALCIUM: 9.6 mg/dL (ref 8.9–10.3)
CO2: 27 mmol/L (ref 22–32)
CREATININE: 1.04 mg/dL (ref 0.61–1.24)
Chloride: 104 mmol/L (ref 101–111)
Glucose, Bld: 96 mg/dL (ref 65–99)
Potassium: 4.1 mmol/L (ref 3.5–5.1)
SODIUM: 137 mmol/L (ref 135–145)

## 2017-05-09 LAB — ABO/RH: ABO/RH(D): O POS

## 2017-05-09 LAB — CBC
HCT: 43 % (ref 39.0–52.0)
Hemoglobin: 14.5 g/dL (ref 13.0–17.0)
MCH: 33.1 pg (ref 26.0–34.0)
MCHC: 33.7 g/dL (ref 30.0–36.0)
MCV: 98.2 fL (ref 78.0–100.0)
PLATELETS: 292 10*3/uL (ref 150–400)
RBC: 4.38 MIL/uL (ref 4.22–5.81)
RDW: 12.2 % (ref 11.5–15.5)
WBC: 4.7 10*3/uL (ref 4.0–10.5)

## 2017-05-09 LAB — SURGICAL PCR SCREEN
MRSA, PCR: NEGATIVE
STAPHYLOCOCCUS AUREUS: NEGATIVE

## 2017-05-09 NOTE — Progress Notes (Addendum)
EKG-04/18/17--epic  04/18/17-LOV- Dr Stanford Breed - epic and on chart Clearance from Dr Stanford Breed after echo .  On chart   04/19/17-echo-epic  And on chart  Abdominal Aorta Study- 05/06/17-results on chart

## 2017-05-15 MED ORDER — TRANEXAMIC ACID 1000 MG/10ML IV SOLN
1000.0000 mg | INTRAVENOUS | Status: AC
  Administered 2017-05-16: 1000 mg via INTRAVENOUS
  Filled 2017-05-15: qty 1100

## 2017-05-16 ENCOUNTER — Other Ambulatory Visit: Payer: Self-pay

## 2017-05-16 ENCOUNTER — Inpatient Hospital Stay (HOSPITAL_COMMUNITY): Payer: Medicare Other | Admitting: Anesthesiology

## 2017-05-16 ENCOUNTER — Encounter (HOSPITAL_COMMUNITY): Payer: Self-pay

## 2017-05-16 ENCOUNTER — Inpatient Hospital Stay (HOSPITAL_COMMUNITY)
Admission: RE | Admit: 2017-05-16 | Discharge: 2017-05-18 | DRG: 470 | Disposition: A | Discharge status: Home / Self Care | Payer: Medicare Other | Source: Ambulatory Visit | Attending: Orthopedic Surgery | Admitting: Orthopedic Surgery

## 2017-05-16 ENCOUNTER — Encounter (HOSPITAL_COMMUNITY)
Admission: RE | Disposition: A | Discharge status: Home / Self Care | Payer: Self-pay | Source: Ambulatory Visit | Attending: Orthopedic Surgery

## 2017-05-16 ENCOUNTER — Inpatient Hospital Stay (HOSPITAL_COMMUNITY): Payer: Medicare Other

## 2017-05-16 DIAGNOSIS — Z471 Aftercare following joint replacement surgery: Secondary | ICD-10-CM | POA: Diagnosis not present

## 2017-05-16 DIAGNOSIS — Z79899 Other long term (current) drug therapy: Secondary | ICD-10-CM

## 2017-05-16 DIAGNOSIS — Z96651 Presence of right artificial knee joint: Secondary | ICD-10-CM

## 2017-05-16 DIAGNOSIS — Z8679 Personal history of other diseases of the circulatory system: Secondary | ICD-10-CM | POA: Diagnosis not present

## 2017-05-16 DIAGNOSIS — M549 Dorsalgia, unspecified: Secondary | ICD-10-CM | POA: Diagnosis present

## 2017-05-16 DIAGNOSIS — M1711 Unilateral primary osteoarthritis, right knee: Secondary | ICD-10-CM | POA: Diagnosis not present

## 2017-05-16 DIAGNOSIS — Z87891 Personal history of nicotine dependence: Secondary | ICD-10-CM | POA: Diagnosis not present

## 2017-05-16 DIAGNOSIS — Z791 Long term (current) use of non-steroidal anti-inflammatories (NSAID): Secondary | ICD-10-CM

## 2017-05-16 DIAGNOSIS — G8918 Other acute postprocedural pain: Secondary | ICD-10-CM | POA: Diagnosis not present

## 2017-05-16 HISTORY — PX: KNEE ARTHROPLASTY: SHX992

## 2017-05-16 LAB — TYPE AND SCREEN
ABO/RH(D): O POS
Antibody Screen: NEGATIVE

## 2017-05-16 IMAGING — DX DG KNEE 1-2V PORT*R*
3 series · 3 of 3 positions shown · non-contrast
Comparison: None.

CLINICAL DATA: Total knee replacement

EXAM:
PORTABLE RIGHT KNEE - 1-2 VIEW

[knee ap]
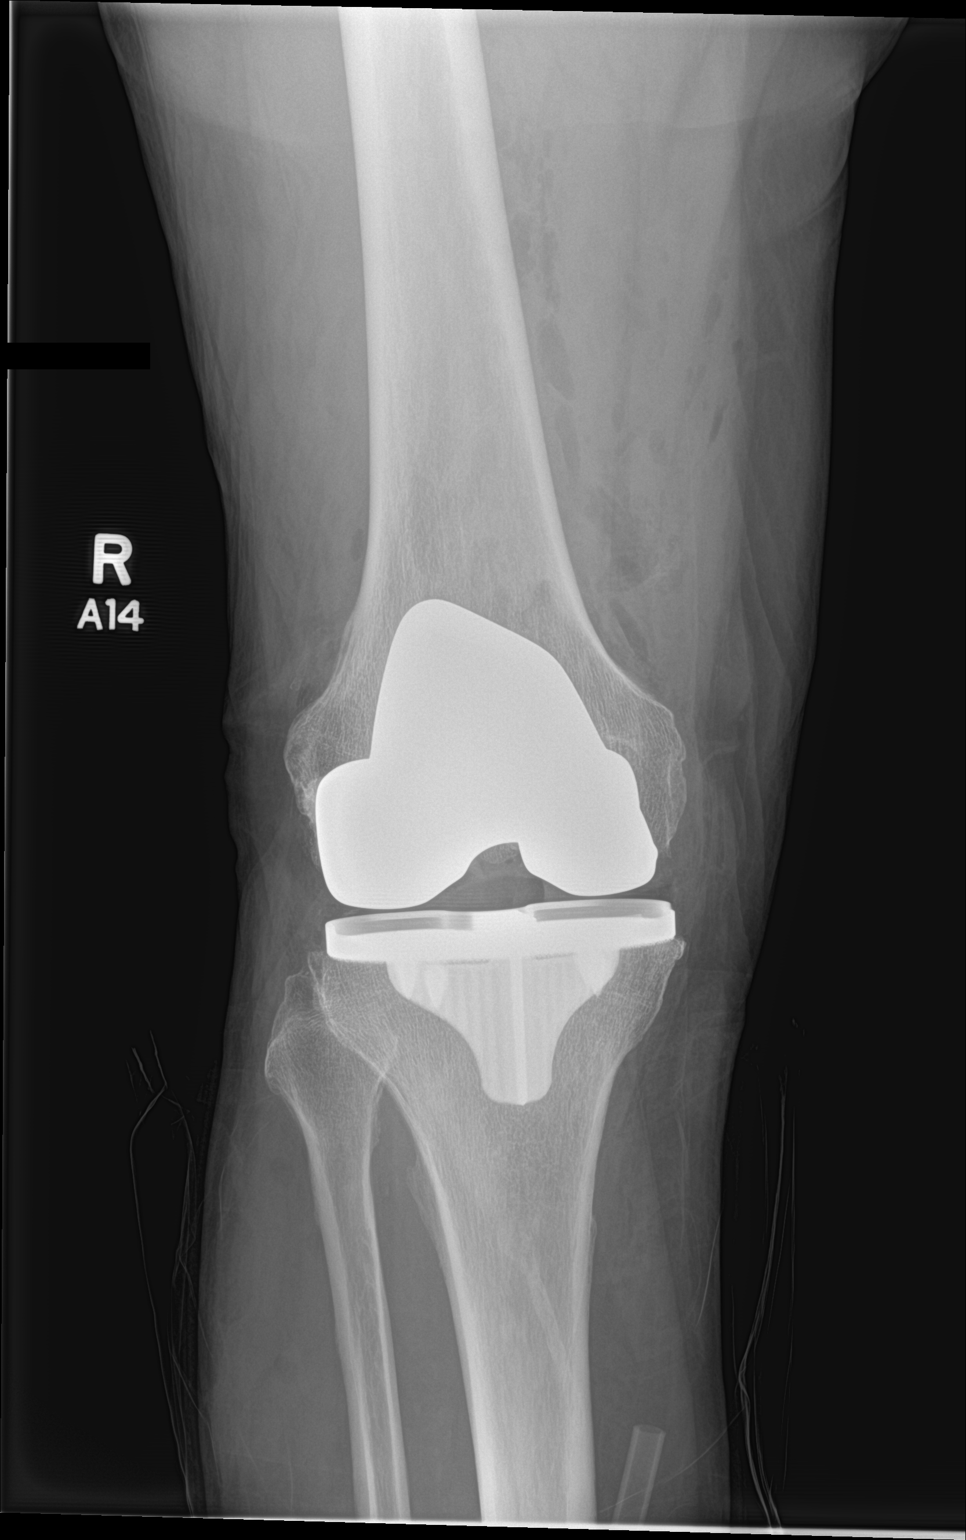

[knee lat (1 of 2)]
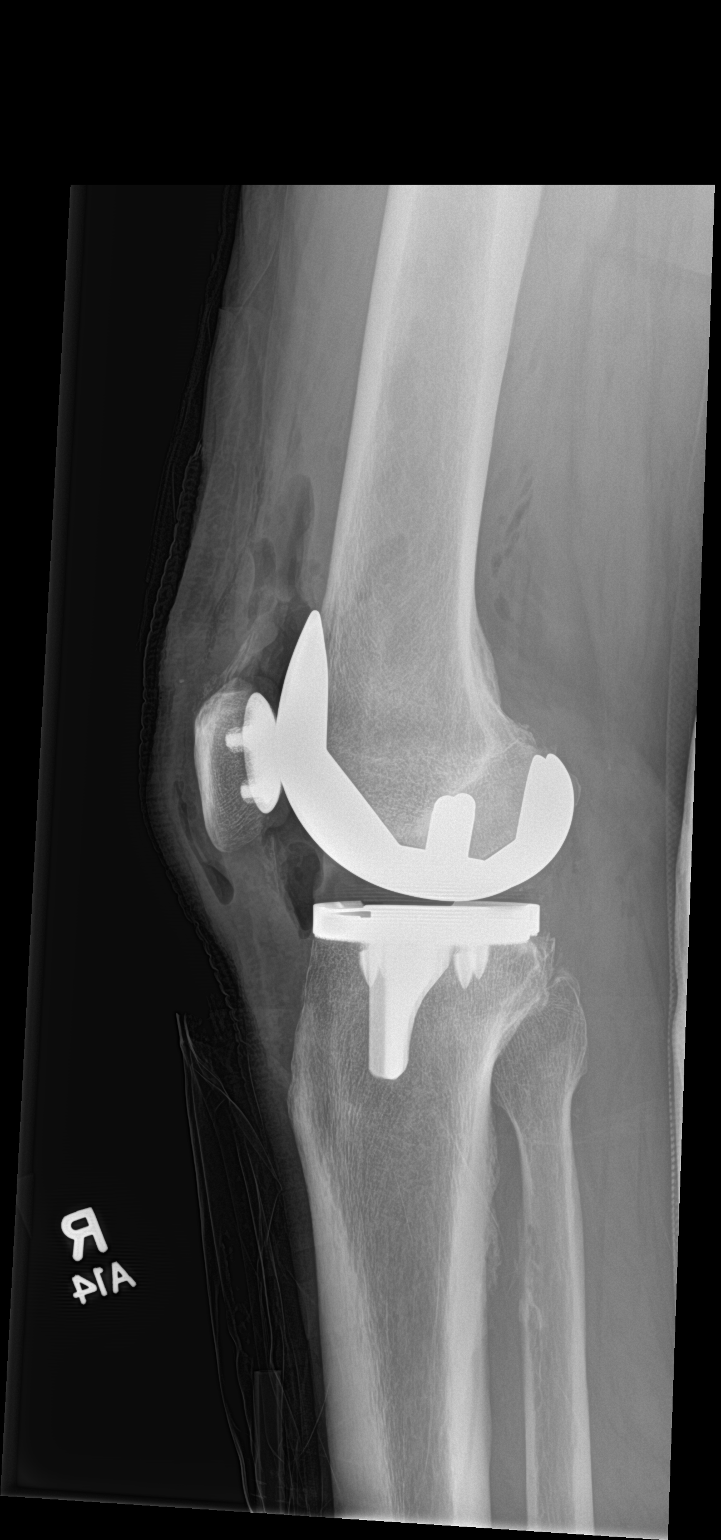

[knee lat (2 of 2)]
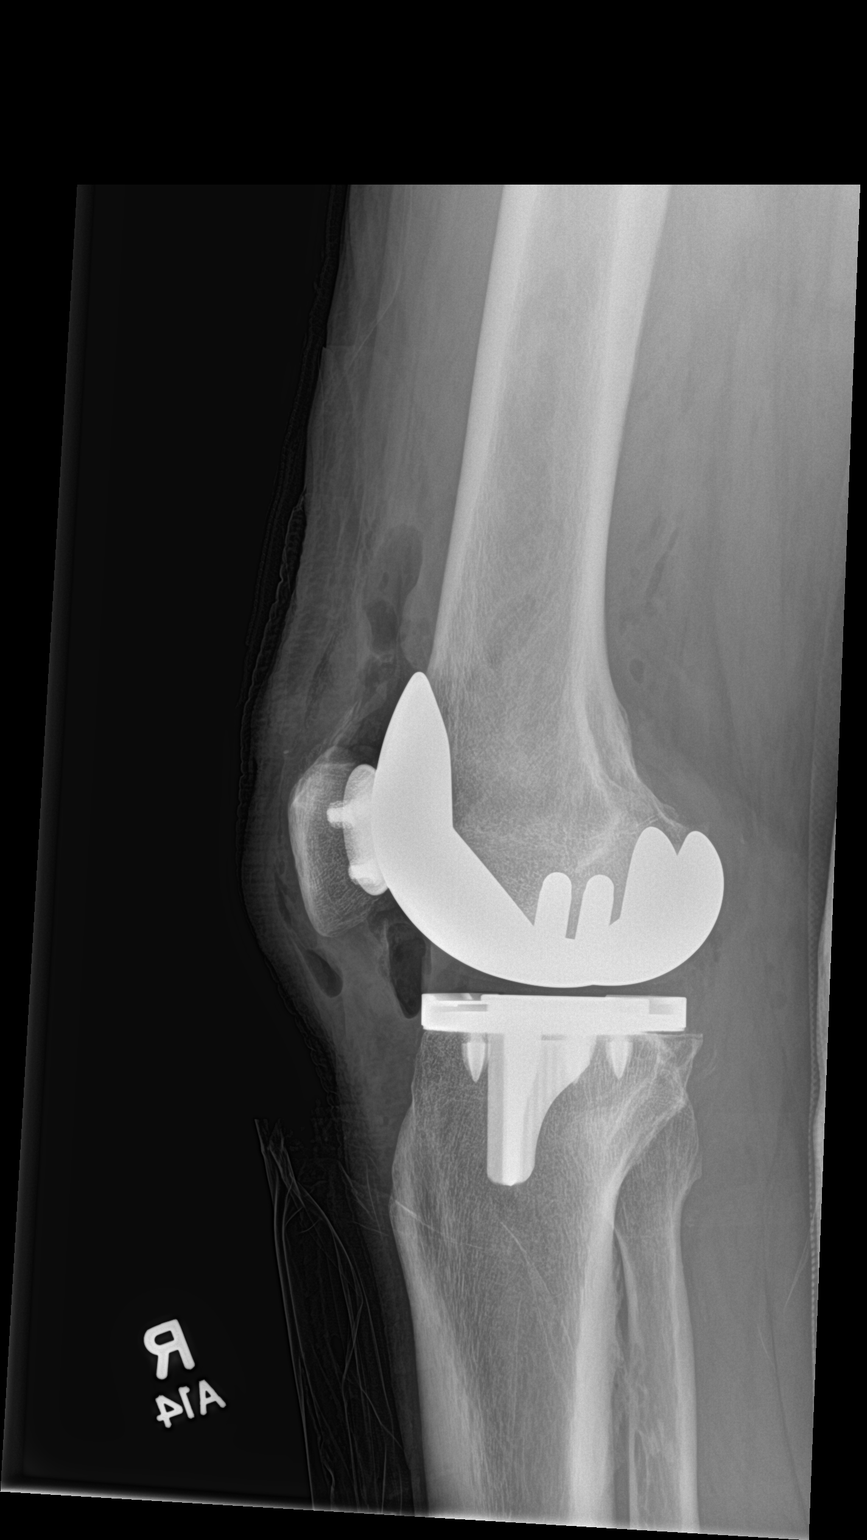

[3 of 3 positions shown; findings below may reference images not displayed]

FINDINGS: Frontal and lateral views were obtained. The patient is status post
total knee replacement with prosthetic components well-seated. There
is soft tissue air in the joint. No joint effusion. No fracture or
dislocation. No erosive change.
IMPRESSION: Total knee replacement prosthetic components well-seated. Soft
tissue air in the joint is an expected postoperative finding. No
fracture, dislocation, or joint effusion.

## 2017-05-16 SURGERY — ARTHROPLASTY, KNEE, TOTAL, USING IMAGELESS COMPUTER-ASSISTED NAVIGATION
Anesthesia: Spinal | Site: Knee | Laterality: Right

## 2017-05-16 MED ORDER — PROPOFOL 10 MG/ML IV BOLUS
INTRAVENOUS | Status: AC
  Filled 2017-05-16: qty 20

## 2017-05-16 MED ORDER — KETOROLAC TROMETHAMINE 30 MG/ML IJ SOLN
INTRAMUSCULAR | Status: DC | PRN
  Administered 2017-05-16: 30 mg

## 2017-05-16 MED ORDER — FENTANYL CITRATE (PF) 100 MCG/2ML IJ SOLN
50.0000 ug | INTRAMUSCULAR | Status: DC
  Administered 2017-05-16: 100 ug via INTRAVENOUS
  Filled 2017-05-16: qty 2

## 2017-05-16 MED ORDER — ACETAMINOPHEN 325 MG PO TABS: 650.0000 mg | ORAL_TABLET | ORAL | Status: DC | PRN

## 2017-05-16 MED ORDER — SODIUM CHLORIDE 0.9 % IR SOLN
Status: DC | PRN
  Administered 2017-05-16: 4000 mL

## 2017-05-16 MED ORDER — SODIUM CHLORIDE 0.9 % IJ SOLN
INTRAMUSCULAR | Status: DC | PRN
  Administered 2017-05-16: 30 mL

## 2017-05-16 MED ORDER — ASPIRIN 81 MG PO CHEW
81.0000 mg | CHEWABLE_TABLET | Freq: Two times a day (BID) | ORAL | Status: DC
  Administered 2017-05-16 – 2017-05-18 (×4): 81 mg via ORAL
  Filled 2017-05-16 (×4): qty 1

## 2017-05-16 MED ORDER — EPHEDRINE 5 MG/ML INJ
INTRAVENOUS | Status: AC
  Filled 2017-05-16: qty 10

## 2017-05-16 MED ORDER — HYDROCODONE-ACETAMINOPHEN 5-325 MG PO TABS
1.0000 | ORAL_TABLET | ORAL | Status: DC | PRN
  Filled 2017-05-16: qty 1

## 2017-05-16 MED ORDER — KETOROLAC TROMETHAMINE 30 MG/ML IJ SOLN
INTRAMUSCULAR | Status: AC
  Filled 2017-05-16: qty 1

## 2017-05-16 MED ORDER — DIPHENHYDRAMINE HCL 12.5 MG/5ML PO ELIX: 12.5000 mg | ORAL_SOLUTION | ORAL | Status: DC | PRN

## 2017-05-16 MED ORDER — DEXAMETHASONE SODIUM PHOSPHATE 10 MG/ML IJ SOLN
10.0000 mg | Freq: Once | INTRAMUSCULAR | Status: AC
  Administered 2017-05-17: 10:00:00 10 mg via INTRAVENOUS
  Filled 2017-05-16: qty 1

## 2017-05-16 MED ORDER — POVIDONE-IODINE 10 % EX SWAB
2.0000 "application " | Freq: Once | CUTANEOUS | Status: AC
  Administered 2017-05-16: 2 via TOPICAL

## 2017-05-16 MED ORDER — SODIUM CHLORIDE 0.9 % IV SOLN: INTRAVENOUS | Status: DC

## 2017-05-16 MED ORDER — ONDANSETRON HCL 4 MG/2ML IJ SOLN
INTRAMUSCULAR | Status: AC
  Filled 2017-05-16: qty 2

## 2017-05-16 MED ORDER — BUPIVACAINE HCL (PF) 0.25 % IJ SOLN
INTRAMUSCULAR | Status: AC
  Filled 2017-05-16: qty 30

## 2017-05-16 MED ORDER — STERILE WATER FOR IRRIGATION IR SOLN
Status: DC | PRN
  Administered 2017-05-16: 2000 mL

## 2017-05-16 MED ORDER — SODIUM CHLORIDE 0.9 % IV SOLN
INTRAVENOUS | Status: DC
  Administered 2017-05-16 (×2): via INTRAVENOUS

## 2017-05-16 MED ORDER — MEPERIDINE HCL 50 MG/ML IJ SOLN: 6.2500 mg | INTRAMUSCULAR | Status: DC | PRN

## 2017-05-16 MED ORDER — HYDROMORPHONE HCL 1 MG/ML IJ SOLN: 0.2500 mg | INTRAMUSCULAR | Status: DC | PRN

## 2017-05-16 MED ORDER — ROPIVACAINE HCL 7.5 MG/ML IJ SOLN
INTRAMUSCULAR | Status: DC | PRN
  Administered 2017-05-16: 20 mL via PERINEURAL

## 2017-05-16 MED ORDER — SENNA 8.6 MG PO TABS
2.0000 | ORAL_TABLET | Freq: Every day | ORAL | Status: DC
  Administered 2017-05-16 – 2017-05-17 (×2): 17.2 mg via ORAL
  Filled 2017-05-16 (×2): qty 2

## 2017-05-16 MED ORDER — METOCLOPRAMIDE HCL 5 MG PO TABS: 5.0000 mg | ORAL_TABLET | Freq: Three times a day (TID) | ORAL | Status: DC | PRN

## 2017-05-16 MED ORDER — KETOROLAC TROMETHAMINE 15 MG/ML IJ SOLN
15.0000 mg | Freq: Four times a day (QID) | INTRAMUSCULAR | Status: AC
  Administered 2017-05-16 – 2017-05-17 (×4): 15 mg via INTRAVENOUS
  Filled 2017-05-16 (×4): qty 1

## 2017-05-16 MED ORDER — PANTOPRAZOLE SODIUM 40 MG PO TBEC
40.0000 mg | DELAYED_RELEASE_TABLET | Freq: Every day | ORAL | Status: DC
  Administered 2017-05-16 – 2017-05-18 (×3): 40 mg via ORAL
  Filled 2017-05-16 (×4): qty 1

## 2017-05-16 MED ORDER — ONDANSETRON HCL 4 MG PO TABS: 4.0000 mg | ORAL_TABLET | Freq: Four times a day (QID) | ORAL | Status: DC | PRN

## 2017-05-16 MED ORDER — ONDANSETRON HCL 4 MG/2ML IJ SOLN: 4.0000 mg | Freq: Once | INTRAMUSCULAR | Status: DC | PRN

## 2017-05-16 MED ORDER — MIDAZOLAM HCL 2 MG/2ML IJ SOLN
1.0000 mg | INTRAMUSCULAR | Status: DC
  Administered 2017-05-16: 1 mg via INTRAVENOUS
  Filled 2017-05-16: qty 2

## 2017-05-16 MED ORDER — ACETAMINOPHEN 10 MG/ML IV SOLN
1000.0000 mg | INTRAVENOUS | Status: AC
  Administered 2017-05-16: 1000 mg via INTRAVENOUS
  Filled 2017-05-16: qty 100

## 2017-05-16 MED ORDER — METOCLOPRAMIDE HCL 5 MG/ML IJ SOLN: 5.0000 mg | Freq: Three times a day (TID) | INTRAMUSCULAR | Status: DC | PRN

## 2017-05-16 MED ORDER — CEFAZOLIN SODIUM-DEXTROSE 2-4 GM/100ML-% IV SOLN
2.0000 g | Freq: Four times a day (QID) | INTRAVENOUS | Status: AC
  Administered 2017-05-16 (×2): 2 g via INTRAVENOUS
  Filled 2017-05-16 (×2): qty 100

## 2017-05-16 MED ORDER — TRANEXAMIC ACID 1000 MG/10ML IV SOLN
1000.0000 mg | Freq: Once | INTRAVENOUS | Status: AC
  Administered 2017-05-16: 1000 mg via INTRAVENOUS
  Filled 2017-05-16: qty 1100

## 2017-05-16 MED ORDER — METHOCARBAMOL 500 MG PO TABS
500.0000 mg | ORAL_TABLET | Freq: Four times a day (QID) | ORAL | Status: DC | PRN
  Administered 2017-05-16 – 2017-05-17 (×3): 500 mg via ORAL
  Filled 2017-05-16 (×3): qty 1

## 2017-05-16 MED ORDER — MENTHOL 3 MG MT LOZG: 1.0000 | LOZENGE | OROMUCOSAL | Status: DC | PRN

## 2017-05-16 MED ORDER — POLYETHYLENE GLYCOL 3350 17 G PO PACK: 17.0000 g | PACK | Freq: Every day | ORAL | Status: DC | PRN

## 2017-05-16 MED ORDER — PHENOL 1.4 % MT LIQD: 1.0000 | OROMUCOSAL | Status: DC | PRN

## 2017-05-16 MED ORDER — ALUM & MAG HYDROXIDE-SIMETH 200-200-20 MG/5ML PO SUSP: 30.0000 mL | ORAL | Status: DC | PRN

## 2017-05-16 MED ORDER — BUPIVACAINE HCL (PF) 0.25 % IJ SOLN
INTRAMUSCULAR | Status: DC | PRN
  Administered 2017-05-16: 30 mL

## 2017-05-16 MED ORDER — HYDROCODONE-ACETAMINOPHEN 5-325 MG PO TABS
2.0000 | ORAL_TABLET | ORAL | Status: DC | PRN
  Administered 2017-05-16 – 2017-05-18 (×11): 2 via ORAL
  Filled 2017-05-16 (×11): qty 2

## 2017-05-16 MED ORDER — DOCUSATE SODIUM 100 MG PO CAPS
100.0000 mg | ORAL_CAPSULE | Freq: Two times a day (BID) | ORAL | Status: DC
  Administered 2017-05-16 – 2017-05-18 (×4): 100 mg via ORAL
  Filled 2017-05-16 (×4): qty 1

## 2017-05-16 MED ORDER — ACETAMINOPHEN 650 MG RE SUPP: 650.0000 mg | RECTAL | Status: DC | PRN

## 2017-05-16 MED ORDER — CEFAZOLIN SODIUM-DEXTROSE 2-4 GM/100ML-% IV SOLN
2.0000 g | INTRAVENOUS | Status: AC
  Administered 2017-05-16: 2 g via INTRAVENOUS
  Filled 2017-05-16: qty 100

## 2017-05-16 MED ORDER — SODIUM CHLORIDE 0.9 % IJ SOLN
INTRAMUSCULAR | Status: AC
  Filled 2017-05-16: qty 50

## 2017-05-16 MED ORDER — LACTATED RINGERS IV SOLN
INTRAVENOUS | Status: DC
  Administered 2017-05-16 (×2): via INTRAVENOUS

## 2017-05-16 MED ORDER — ONDANSETRON HCL 4 MG/2ML IJ SOLN
4.0000 mg | Freq: Four times a day (QID) | INTRAMUSCULAR | Status: DC | PRN
  Administered 2017-05-16 – 2017-05-17 (×2): 4 mg via INTRAVENOUS
  Filled 2017-05-16 (×2): qty 2

## 2017-05-16 MED ORDER — CHLORHEXIDINE GLUCONATE 4 % EX LIQD: 60.0000 mL | Freq: Once | CUTANEOUS | Status: DC

## 2017-05-16 MED ORDER — ISOPROPYL ALCOHOL 70 % SOLN
Status: DC | PRN
  Administered 2017-05-16: 1 via TOPICAL

## 2017-05-16 MED ORDER — EPHEDRINE SULFATE 50 MG/ML IJ SOLN
INTRAMUSCULAR | Status: DC | PRN
  Administered 2017-05-16: 15 mg via INTRAVENOUS

## 2017-05-16 MED ORDER — PROPOFOL 10 MG/ML IV BOLUS
INTRAVENOUS | Status: AC
  Filled 2017-05-16: qty 40

## 2017-05-16 MED ORDER — ONDANSETRON HCL 4 MG/2ML IJ SOLN
INTRAMUSCULAR | Status: DC | PRN
  Administered 2017-05-16: 4 mg via INTRAVENOUS

## 2017-05-16 MED ORDER — METHOCARBAMOL 1000 MG/10ML IJ SOLN
500.0000 mg | Freq: Four times a day (QID) | INTRAMUSCULAR | Status: DC | PRN
  Filled 2017-05-16: qty 5

## 2017-05-16 MED ORDER — PROPOFOL 500 MG/50ML IV EMUL
INTRAVENOUS | Status: DC | PRN
  Administered 2017-05-16: 100 ug/kg/min via INTRAVENOUS

## 2017-05-16 SURGICAL SUPPLY — 59 items
ADH SKN CLS APL DERMABOND .7 (GAUZE/BANDAGES/DRESSINGS) ×2
BAG SPEC THK2 15X12 ZIP CLS (MISCELLANEOUS)
BAG ZIPLOCK 12X15 (MISCELLANEOUS) IMPLANT
BANDAGE ACE 4X5 VEL STRL LF (GAUZE/BANDAGES/DRESSINGS) ×3 IMPLANT
BANDAGE ACE 6X5 VEL STRL LF (GAUZE/BANDAGES/DRESSINGS) ×3 IMPLANT
BLADE SAW RECIPROCATING 77.5 (BLADE) ×3 IMPLANT
CAPT KNEE TRIATH TK-4 ×2 IMPLANT
CHLORAPREP W/TINT 26ML (MISCELLANEOUS) ×6 IMPLANT
COVER SURGICAL LIGHT HANDLE (MISCELLANEOUS) ×3 IMPLANT
CUFF TOURN SGL QUICK 34 (TOURNIQUET CUFF) ×3
CUFF TRNQT CYL 34X4X40X1 (TOURNIQUET CUFF) ×1 IMPLANT
DECANTER SPIKE VIAL GLASS SM (MISCELLANEOUS) ×6 IMPLANT
DERMABOND ADVANCED (GAUZE/BANDAGES/DRESSINGS) ×4
DERMABOND ADVANCED .7 DNX12 (GAUZE/BANDAGES/DRESSINGS) ×2 IMPLANT
DRAPE SHEET LG 3/4 BI-LAMINATE (DRAPES) ×6 IMPLANT
DRAPE U-SHAPE 47X51 STRL (DRAPES) ×3 IMPLANT
DRESSING AQUACEL AG SP 3.5X10 (GAUZE/BANDAGES/DRESSINGS) IMPLANT
DRSG AQUACEL AG ADV 3.5X10 (GAUZE/BANDAGES/DRESSINGS) ×3 IMPLANT
DRSG AQUACEL AG SP 3.5X10 (GAUZE/BANDAGES/DRESSINGS) ×3
DRSG TEGADERM 4X4.75 (GAUZE/BANDAGES/DRESSINGS) IMPLANT
ELECT BLADE TIP CTD 4 INCH (ELECTRODE) ×3 IMPLANT
ELECT REM PT RETURN 15FT ADLT (MISCELLANEOUS) ×3 IMPLANT
EVACUATOR 1/8 PVC DRAIN (DRAIN) IMPLANT
GAUZE SPONGE 4X4 12PLY STRL (GAUZE/BANDAGES/DRESSINGS) ×3 IMPLANT
GLOVE BIO SURGEON STRL SZ8.5 (GLOVE) ×6 IMPLANT
GLOVE BIOGEL PI IND STRL 8.5 (GLOVE) ×1 IMPLANT
GLOVE BIOGEL PI INDICATOR 8.5 (GLOVE) ×2
GOWN SPEC L3 XXLG W/TWL (GOWN DISPOSABLE) ×3 IMPLANT
HANDPIECE INTERPULSE COAX TIP (DISPOSABLE) ×3
HOOD PEEL AWAY FLYTE STAYCOOL (MISCELLANEOUS) ×9 IMPLANT
MARKER SKIN DUAL TIP RULER LAB (MISCELLANEOUS) ×3 IMPLANT
NDL SPNL 18GX3.5 QUINCKE PK (NEEDLE) ×1 IMPLANT
NEEDLE SPNL 18GX3.5 QUINCKE PK (NEEDLE) ×3 IMPLANT
NS IRRIG 1000ML POUR BTL (IV SOLUTION) ×3 IMPLANT
PACK TOTAL KNEE CUSTOM (KITS) ×3 IMPLANT
PADDING CAST COTTON 6X4 STRL (CAST SUPPLIES) ×3 IMPLANT
POSITIONER SURGICAL ARM (MISCELLANEOUS) ×3 IMPLANT
SAW OSC TIP CART 19.5X105X1.3 (SAW) ×3 IMPLANT
SEALER BIPOLAR AQUA 6.0 (INSTRUMENTS) ×3 IMPLANT
SET HNDPC FAN SPRY TIP SCT (DISPOSABLE) ×1 IMPLANT
SET PAD KNEE POSITIONER (MISCELLANEOUS) ×3 IMPLANT
SPONGE DRAIN TRACH 4X4 STRL 2S (GAUZE/BANDAGES/DRESSINGS) IMPLANT
SPONGE LAP 18X18 X RAY DECT (DISPOSABLE) IMPLANT
SUCTION FRAZIER HANDLE 12FR (TUBING) ×2
SUCTION TUBE FRAZIER 12FR DISP (TUBING) ×1 IMPLANT
SUT MNCRL AB 3-0 PS2 18 (SUTURE) ×3 IMPLANT
SUT MON AB 2-0 CT1 36 (SUTURE) ×6 IMPLANT
SUT STRATAFIX PDO 1 14 VIOLET (SUTURE) ×3
SUT STRATFX PDO 1 14 VIOLET (SUTURE) ×1
SUT VIC AB 1 CT1 36 (SUTURE) ×9 IMPLANT
SUT VIC AB 2-0 CT1 27 (SUTURE) ×3
SUT VIC AB 2-0 CT1 TAPERPNT 27 (SUTURE) ×1 IMPLANT
SUTURE STRATFX PDO 1 14 VIOLET (SUTURE) ×1 IMPLANT
SYR 50ML LL SCALE MARK (SYRINGE) ×3 IMPLANT
TOWER CARTRIDGE SMART MIX (DISPOSABLE) IMPLANT
TRAY FOLEY W/METER SILVER 16FR (SET/KITS/TRAYS/PACK) ×3 IMPLANT
WATER STERILE IRR 1000ML POUR (IV SOLUTION) ×6 IMPLANT
WRAP KNEE MAXI GEL POST OP (GAUZE/BANDAGES/DRESSINGS) ×3 IMPLANT
YANKAUER SUCT BULB TIP 10FT TU (MISCELLANEOUS) ×3 IMPLANT

## 2017-05-16 NOTE — Anesthesia Preprocedure Evaluation (Signed)
Anesthesia Evaluation  Patient identified by MRN, date of birth, ID band Patient awake    Reviewed: Allergy & Precautions, NPO status , Patient's Chart, lab work & pertinent test results  Airway Mallampati: I  TM Distance: >3 FB Neck ROM: Full    Dental   Pulmonary former smoker,    Pulmonary exam normal        Cardiovascular Normal cardiovascular exam     Neuro/Psych    GI/Hepatic   Endo/Other    Renal/GU      Musculoskeletal   Abdominal   Peds  Hematology   Anesthesia Other Findings   Reproductive/Obstetrics                             Anesthesia Physical Anesthesia Plan  ASA: III  Anesthesia Plan: Spinal   Post-op Pain Management:  Regional for Post-op pain   Induction:   PONV Risk Score and Plan: 1 and Ondansetron and Treatment may vary due to age or medical condition  Airway Management Planned: Simple Face Mask  Additional Equipment:   Intra-op Plan:   Post-operative Plan:   Informed Consent: I have reviewed the patients History and Physical, chart, labs and discussed the procedure including the risks, benefits and alternatives for the proposed anesthesia with the patient or authorized representative who has indicated his/her understanding and acceptance.     Plan Discussed with: CRNA and Surgeon  Anesthesia Plan Comments:         Anesthesia Quick Evaluation

## 2017-05-16 NOTE — Evaluation (Signed)
Physical Therapy Evaluation Patient Details Name: Patrick Collier MRN: [id number] DOB: 1940/08/10 Today's Date: 05/16/2017   History of Present Illness  Pt s/p R TKR and with hx of previous back surgery  Clinical Impression  Pt s/p R TKR and presents with decreased R LE strength/ROM, post op pain and dizziness with OOB activity limiting functional mobility.  Pt should progress to dc home with family assist..  Pt states he has first OP PT appt 05/20/17.    Follow Up Recommendations DC plan and follow up therapy as arranged by surgeon    Equipment Recommendations  None recommended by PT    Recommendations for Other Services OT consult     Precautions / Restrictions Precautions Precautions: Knee Restrictions Weight Bearing Restrictions: No Other Position/Activity Restrictions: WBAT      Mobility  Bed Mobility Overal bed mobility: Needs Assistance Bed Mobility: Supine to Sit;Sit to Supine     Supine to sit: Min assist Sit to supine: Min assist;Mod assist;+2 for physical assistance;+2 for safety/equipment   General bed mobility comments: cues for sequence and use of L LE to self assist.  Increased assist sit to supine 2* onset dizziness  Transfers Overall transfer level: Needs assistance Equipment used: Rolling walker (2 wheeled) Transfers: Sit to/from Stand Sit to Stand: Min assist         General transfer comment: cues for LE management and use of UEs to self assist  Ambulation/Gait Ambulation/Gait assistance: Min assist;+2 physical assistance;+2 safety/equipment Ambulation Distance (Feet): 3 Feet Assistive device: Rolling walker (2 wheeled) Gait Pattern/deviations: Step-to pattern;Decreased step length - right;Decreased step length - left;Shuffle;Trunk flexed Gait velocity: decr Gait velocity interpretation: Below normal speed for age/gender General Gait Details: cues for posture, sequence and position from RW.  Pt side stepped up bed only 2* dizziness with  standing  Stairs            Wheelchair Mobility    Modified Rankin (Stroke Patients Only)       Balance                                             Pertinent Vitals/Pain Pain Assessment: 0-10 Pain Score: 7  Pain Location: R knee Pain Descriptors / Indicators: Aching;Sore;Throbbing Pain Intervention(s): Limited activity within patient's tolerance;Monitored during session;Premedicated before session;Patient requesting pain meds-RN notified;Ice applied    Home Living Family/patient expects to be discharged to:: Private residence Living Arrangements: Spouse/significant other Available Help at Discharge: Family Type of Home: House Home Access: Stairs to enter Entrance Stairs-Rails: Right;Left;Can reach both Entrance Stairs-Number of Steps: 3 Home Layout: Able to live on main level with bedroom/bathroom Home Equipment: Walker - 2 wheels;Cane - single point;Crutches      Prior Function Level of Independence: Independent               Hand Dominance        Extremity/Trunk Assessment   Upper Extremity Assessment Upper Extremity Assessment: Overall WFL for tasks assessed    Lower Extremity Assessment Lower Extremity Assessment: RLE deficits/detail       Communication   Communication: No difficulties  Cognition Arousal/Alertness: Awake/alert Behavior During Therapy: WFL for tasks assessed/performed Overall Cognitive Status: Within Functional Limits for tasks assessed  General Comments      Exercises Total Joint Exercises Ankle Circles/Pumps: AROM;Both;Supine;20 reps   Assessment/Plan    PT Assessment Patient needs continued PT services  PT Problem List Decreased strength;Decreased range of motion;Decreased activity tolerance;Decreased mobility;Decreased knowledge of use of DME;Pain       PT Treatment Interventions DME instruction;Gait training;Stair training;Therapeutic  exercise;Functional mobility training;Therapeutic activities;Patient/family education    PT Goals (Current goals can be found in the Care Plan section)  Acute Rehab PT Goals Patient Stated Goal: Regain IND and get back to gym PT Goal Formulation: With patient Time For Goal Achievement: 05/23/17 Potential to Achieve Goals: Good    Frequency 7X/week   Barriers to discharge        Co-evaluation               AM-PAC PT "6 Clicks" Daily Activity  Outcome Measure Difficulty turning over in bed (including adjusting bedclothes, sheets and blankets)?: Unable Difficulty moving from lying on back to sitting on the side of the bed? : Unable Difficulty sitting down on and standing up from a chair with arms (e.g., wheelchair, bedside commode, etc,.)?: Unable Help needed moving to and from a bed to chair (including a wheelchair)?: A Little Help needed walking in hospital room?: A Little Help needed climbing 3-5 steps with a railing? : A Lot 6 Click Score: 11    End of Session Equipment Utilized During Treatment: Gait belt Activity Tolerance: Other (comment);Patient limited by fatigue(dizzy with OOB activity) Patient left: in bed;with call bell/phone within reach;with bed alarm set Nurse Communication: Mobility status PT Visit Diagnosis: Difficulty in walking, not elsewhere classified (R26.2)    Time: 7915-0569 PT Time Calculation (min) (ACUTE ONLY): 29 min   Charges:   PT Evaluation $PT Eval Low Complexity: 1 Low PT Treatments $Therapeutic Activity: 8-22 mins   PT G Codes:        Pg [phone]   Kamonte Mcmichen 05/16/2017, 5:17 PM

## 2017-05-16 NOTE — Progress Notes (Signed)
AssistedDr. Ossey with right, ultrasound guided, adductor canal block. Side rails up, monitors on throughout procedure. See vital signs in flow sheet. Tolerated Procedure well.  

## 2017-05-16 NOTE — Op Note (Signed)
OPERATIVE REPORT  SURGEON: Rod Can, MD   ASSISTANT: Nehemiah Massed, PA-C.  PREOPERATIVE DIAGNOSIS: Right knee arthritis.   POSTOPERATIVE DIAGNOSIS: Right knee arthritis.   PROCEDURE: Right total knee arthroplasty.   IMPLANTS: Stryker Triathlon CR femur, size 6. Stryker Tritanium tibia, size 6. X3 polyethelyene insert, size 9 mm, CR. 3 button asymmetric patella, size 35 mm.  ANESTHESIA:  Regional and Spinal  TOURNIQUET TIME: Not utilized.   ESTIMATED BLOOD LOSS: 350 mL.  ANTIBIOTICS: 2 g Ancef.  DRAINS: None.  COMPLICATIONS: None   CONDITION: PACU - hemodynamically stable.   BRIEF CLINICAL NOTE: Patrick Collier is a 77 y.o. male with a long-standing history of Right knee arthritis. After failing conservative management, the patient was indicated for total knee arthroplasty. The risks, benefits, and alternatives to the procedure were explained, and the patient elected to proceed.  PROCEDURE IN DETAIL: Adductor canal block was obtained in the pre-op holding area. Once inside the operative room, spinal anesthesia was obtained, and a foley catheter was inserted. The patient was then positioned, a nonsterile tourniquet was placed, and the lower extremity was prepped and draped in the normal sterile surgical fashion. A time-out was called verifying side and site of surgery. The patient received IV antibiotics within 60 minutes of beginning the procedure. The tourniquet was not utilized.  An anterior approach to the knee was performed utilizing a midvastus arthrotomy. A medial release was performed and the patellar fat pad was excised. Stryker navigation was used to cut the distal femur perpendicular to the mechanical axis. A freehand patellar resection was performed, and the patella was sized an prepared with 3 lug holes.  Nagivation was used to make a neutral proximal tibia  resection, taking 9 mm of bone from the less affected lateral side with 3 degrees of slope. The menisci were excised. A spacer block was placed, and the alignment and balance in extension were confirmed.   The distal femur was sized using the 3-degree external rotation guide referencing the posterior femoral cortex. The appropriate 4-in-1 cutting block was pinned into place. Rotation was checked using Whiteside's line, the epicondylar axis, and then confirmed with a spacer block in flexion. The remaining femoral cuts were performed, taking care to protect the MCL.  The tibia was sized and the trial tray was pinned into place. The remaining trail components were inserted. The knee was stable to varus and valgus stress through a full range of motion. The patella tracked centrally, and the PCL was well balanced. The trial components were removed, and the proximal tibial surface was prepared. Final components were impacted into place. The knee was tested for a final time and found to be well balanced.  The wound was copiously irrigated with normal saline with pulse lavage. Marcaine solution was injected into the periarticular soft tissue. The wound was closed in layers using #1 Vicryl and Stratafix for the fascia, 2-0 Vicryl for the subcutaneous fat, 2-0 Monocryl for the deep dermal layer, 3-0 running Monocryl subcuticular Stitch, and Dermabond for the skin. Once the glue was fully dried, an Aquacell Ag and compressive dressing were applied. Tthe patient was transported to the recovery room in stable condition. Sponge, needle, and instrument counts were correct at the end of the case x2. The patient tolerated the procedure well and there were no known complications.  Please note that a surgical assistant was a medical necessity for this procedure in order to perform it in a safe and expeditious manner. Surgical assistant was necessary to  retract the ligaments and vital neurovascular structures to prevent  injury to them and also necessary for proper positioning of the limb to allow for anatomic placement of the prosthesis.

## 2017-05-16 NOTE — Anesthesia Postprocedure Evaluation (Signed)
Anesthesia Post Note  Patient: Patrick Collier  Procedure(s) Performed: RIGHT TOTAL KNEE ARTHROPLASTY WITH COMPUTER NAVIGATION (Right Knee)     Patient location during evaluation: PACU Anesthesia Type: Spinal Level of consciousness: oriented and awake and alert Pain management: pain level controlled Vital Signs Assessment: post-procedure vital signs reviewed and stable Respiratory status: spontaneous breathing, respiratory function stable and patient connected to nasal cannula oxygen Cardiovascular status: blood pressure returned to baseline and stable Postop Assessment: no headache, no backache and no apparent nausea or vomiting Anesthetic complications: no    Last Vitals:  Vitals:   05/16/17 1430 05/16/17 1500  BP: 133/81 (!) 150/72  Pulse: (!) 57 63  Resp: 16 16  Temp: 36.5 C 36.6 C  SpO2: 100% 100%    Last Pain:  Vitals:   05/16/17 1430  TempSrc:   PainSc: 0-No pain                 [name] DAVID

## 2017-05-16 NOTE — Interval H&P Note (Signed)
History and Physical Interval Note:  05/16/2017 9:58 AM  Patrick Collier  has presented today for surgery, with the diagnosis of Right knee degenerative joint disease  The various methods of treatment have been discussed with the patient and family. After consideration of risks, benefits and other options for treatment, the patient has consented to  Procedure(s) with comments: RIGHT TOTAL KNEE ARTHROPLASTY WITH COMPUTER NAVIGATION (Right) - Needs RNFA as a surgical intervention .  The patient's history has been reviewed, patient examined, no change in status, stable for surgery.  I have reviewed the patient's chart and labs.  Questions were answered to the patient's satisfaction.     Hilton Cork Kosisochukwu [place]

## 2017-05-16 NOTE — Transfer of Care (Signed)
Immediate Anesthesia Transfer of Care Note  Patient: KHYRIN TREVATHAN  Procedure(s) Performed: RIGHT TOTAL KNEE ARTHROPLASTY WITH COMPUTER NAVIGATION (Right Knee)  Patient Location: PACU  Anesthesia Type:Regional and Spinal  Level of Consciousness: sedated  Airway & Oxygen Therapy: Patient Spontanous Breathing and Patient connected to face mask oxygen  Post-op Assessment: Report given to RN and Post -op Vital signs reviewed and stable  Post vital signs: Reviewed and stable  Last Vitals:  Vitals:   05/16/17 1019 05/16/17 1020  BP: (!) 143/93   Pulse: (!) 59 (!) 59  Resp: 10 14  Temp:    SpO2: 100% 100%    Last Pain:  Vitals:   05/16/17 0834  TempSrc: Oral         Complications: No apparent anesthesia complications

## 2017-05-16 NOTE — Discharge Instructions (Signed)
° °Dr. Thanya Cegielski °Total Joint Specialist °[facility] °[address] °, [place] [address] °[phone] ° °TOTAL KNEE REPLACEMENT POSTOPERATIVE DIRECTIONS ° ° ° °Knee Rehabilitation, Guidelines Following Surgery  °Results after knee surgery are often greatly improved when you follow the exercise, range of motion and muscle strengthening exercises prescribed by your doctor. Safety measures are also important to protect the knee from further injury. Any time any of these exercises cause you to have increased pain or swelling in your knee joint, decrease the amount until you are comfortable again and slowly increase them. If you have problems or questions, call your caregiver or physical therapist for advice.  ° °WEIGHT BEARING °Weight bearing as tolerated with assist device (walker, cane, etc) as directed, use it as long as suggested by your surgeon or therapist, typically at least 4-6 weeks. ° °HOME CARE INSTRUCTIONS  °Remove items at home which could result in a fall. This includes throw rugs or furniture in walking pathways.  °Continue medications as instructed at time of discharge. °You may have some home medications which will be placed on hold until you complete the course of blood thinner medication.  °You may start showering once you are discharged home but do not submerge the incision under water. Just pat the incision dry and apply a dry gauze dressing on daily. °Walk with walker as instructed.  °You may resume a sexual relationship in one month or when given the OK by your doctor.  °· Use walker as long as suggested by your caregivers. °· Avoid periods of inactivity such as sitting longer than an hour when not asleep. This helps prevent blood clots.  °You may put full weight on your legs and walk as much as is comfortable.  °You may return to work once you are cleared by your doctor.  °Do not drive a car for 6 weeks or until released by you surgeon.  °· Do not drive  while taking narcotics.  °Wear the elastic stockings for three weeks following surgery during the day but you may remove then at night. °Make sure you keep all of your appointments after your operation with all of your doctors and caregivers. You should call the office at the above phone number and make an appointment for approximately two weeks after the date of your surgery. °Do not remove your surgical dressing. The dressing is waterproof; you may take showers in 3 days, but do not take tub baths or submerge the dressing. °Please pick up a stool softener and laxative for home use as long as you are requiring pain medications. °· ICE to the affected knee every three hours for 30 minutes at a time and then as needed for pain and swelling.  Continue to use ice on the knee for pain and swelling from surgery. You may notice swelling that will progress down to the foot and ankle.  This is normal after surgery.  Elevate the leg when you are not up walking on it.   °It is important for you to complete the blood thinner medication as prescribed by your doctor. °· Continue to use the breathing machine which will help keep your temperature down.  It is common for your temperature to cycle up and down following surgery, especially at night when you are not up moving around and exerting yourself.  The breathing machine keeps your lungs expanded and your temperature down. ° °RANGE OF MOTION AND STRENGTHENING EXERCISES  °Rehabilitation of the knee is important following   a knee injury or an operation. After just a few days of immobilization, the muscles of the thigh which control the knee become weakened and shrink (atrophy). Knee exercises are designed to build up the tone and strength of the thigh muscles and to improve knee motion. Often times heat used for twenty to thirty minutes before working out will loosen up your tissues and help with improving the range of motion but do not use heat for the first two weeks following  surgery. These exercises can be done on a training (exercise) mat, on the floor, on a table or on a bed. Use what ever works the best and is most comfortable for you Knee exercises include:  °Leg Lifts - While your knee is still immobilized in a splint or cast, you can do straight leg raises. Lift the leg to 60 degrees, hold for 3 sec, and slowly lower the leg. Repeat 10-20 times 2-3 times daily. Perform this exercise against resistance later as your knee gets better.  °Quad and Hamstring Sets - Tighten up the muscle on the front of the thigh (Quad) and hold for 5-10 sec. Repeat this 10-20 times hourly. Hamstring sets are done by pushing the foot backward against an object and holding for 5-10 sec. Repeat as with quad sets.  °A rehabilitation program following serious knee injuries can speed recovery and prevent re-injury in the future due to weakened muscles. Contact your doctor or a physical therapist for more information on knee rehabilitation.  ° °SKILLED REHAB INSTRUCTIONS: °If the patient is transferred to a skilled rehab facility following release from the hospital, a list of the current medications will be sent to the facility for the patient to continue.  When discharged from the skilled rehab facility, please have the facility set up the patient's Home Health Physical Therapy prior to being released. Also, the skilled facility will be responsible for providing the patient with their medications at time of release from the facility to include their pain medication, the muscle relaxants, and their blood thinner medication. If the patient is still at the rehab facility at time of the two week follow up appointment, the skilled rehab facility will also need to assist the patient in arranging follow up appointment in our office and any transportation needs. ° °MAKE SURE YOU:  °Understand these instructions.  °Will watch your condition.  °Will get help right away if you are not doing well or get worse.   ° ° °Pick up stool softner and laxative for home use following surgery while on pain medications. °Do NOT remove your dressing. You may shower.  °Do not take tub baths or submerge incision under water. °May shower starting three days after surgery. °Please use a clean towel to pat the incision dry following showers. °Continue to use ice for pain and swelling after surgery. °Do not use any lotions or creams on the incision until instructed by your surgeon. ° °

## 2017-05-16 NOTE — Anesthesia Procedure Notes (Signed)
Spinal  Patient location during procedure: OR Start time: 05/16/2017 11:15 AM End time: 05/16/2017 11:19 AM Staffing Anesthesiologist: Lillia Abed, MD Performed: anesthesiologist  Preanesthetic Checklist Completed: patient identified, surgical consent, pre-op evaluation, timeout performed, IV checked, risks and benefits discussed and monitors and equipment checked Spinal Block Patient position: sitting Prep: ChloraPrep Patient monitoring: heart rate, cardiac monitor, continuous pulse ox and blood pressure Approach: left paramedian Location: L3-4 Injection technique: single-shot Needle Needle type: Pencan  Needle gauge: 24 G Needle length: 9 cm Needle insertion depth: 10 cm

## 2017-05-16 NOTE — Anesthesia Procedure Notes (Signed)
Anesthesia Regional Block: Adductor canal block   Pre-Anesthetic Checklist: ,, timeout performed, Correct Patient, Correct Site, Correct Laterality, Correct Procedure, Correct Position, site marked, Risks and benefits discussed,  Surgical consent,  Pre-op evaluation,  At surgeon's request and post-op pain management  Laterality: Right  Prep: chloraprep       Needles:  Injection technique: Single-shot  Needle Type: Echogenic Stimulator Needle     Needle Length: 9cm  Needle Gauge: 21     Additional Needles:   Narrative:  Start time: 05/16/2017 9:30 AM End time: 05/16/2017 9:40 AM Injection made incrementally with aspirations every 5 mL.  Performed by: Personally  Anesthesiologist: Lillia Abed, MD  Additional Notes: Monitors applied. Patient sedated. Sterile prep and drape,hand hygiene and sterile gloves were used. Relevant anatomy identified.Needle position confirmed.Local anesthetic injected incrementally after negative aspiration. Local anesthetic spread visualized around nerve(s). Vascular puncture avoided. No complications. Image printed for medical record.The patient tolerated the procedure well.    Lillia Abed MD

## 2017-05-17 LAB — BASIC METABOLIC PANEL
ANION GAP: 7 (ref 5–15)
BUN: 15 mg/dL (ref 6–20)
CHLORIDE: 104 mmol/L (ref 101–111)
CO2: 28 mmol/L (ref 22–32)
Calcium: 8.2 mg/dL — ABNORMAL LOW (ref 8.9–10.3)
Creatinine, Ser: 0.93 mg/dL (ref 0.61–1.24)
GFR calc Af Amer: >60 mL/min (ref >60)
GLUCOSE: 125 mg/dL — AB (ref 65–99)
POTASSIUM: 3.9 mmol/L (ref 3.5–5.1)
Sodium: 139 mmol/L (ref 135–145)

## 2017-05-17 LAB — CBC
HEMATOCRIT: 34.3 % — AB (ref 39.0–52.0)
HEMOGLOBIN: 11.5 g/dL — AB (ref 13.0–17.0)
MCH: 33 pg (ref 26.0–34.0)
MCHC: 33.5 g/dL (ref 30.0–36.0)
MCV: 98.6 fL (ref 78.0–100.0)
PLATELETS: 222 10*3/uL (ref 150–400)
RBC: 3.48 MIL/uL — ABNORMAL LOW (ref 4.22–5.81)
RDW: 12.5 % (ref 11.5–15.5)
WBC: 9.2 10*3/uL (ref 4.0–10.5)

## 2017-05-17 MED ORDER — DOCUSATE SODIUM 100 MG PO CAPS: 100.0000 mg | ORAL_CAPSULE | Freq: Two times a day (BID) | ORAL | 1 refills | Status: DC

## 2017-05-17 MED ORDER — SENNA 8.6 MG PO TABS: 2.0000 | ORAL_TABLET | Freq: Every day | ORAL | 0 refills | Status: DC

## 2017-05-17 MED ORDER — ASPIRIN 81 MG PO CHEW: 81.0000 mg | CHEWABLE_TABLET | Freq: Two times a day (BID) | ORAL | 1 refills | Status: DC

## 2017-05-17 MED ORDER — ONDANSETRON HCL 4 MG PO TABS: 4.0000 mg | ORAL_TABLET | Freq: Four times a day (QID) | ORAL | 0 refills | Status: DC | PRN

## 2017-05-17 MED ORDER — HYDROCODONE-ACETAMINOPHEN 5-325 MG PO TABS: 1.0000 | ORAL_TABLET | ORAL | 0 refills | Status: DC | PRN

## 2017-05-17 NOTE — Discharge Summary (Signed)
Physician Discharge Summary  Patient ID: Patrick Collier MRN: [id number] DOB/AGE: April 02, 1941 77 y.o.  Admit date: 05/16/2017 Discharge date: 05/18/2017  Admission Diagnoses:  Osteoarthritis of right knee  Discharge Diagnoses:  Principal Problem:   Osteoarthritis of right knee   Past Medical History:  Diagnosis Date  . Arthritis   . Back pain   . Heart murmur   . History of kidney stones   . ICH (intracerebral hemorrhage) ([facility])     Surgeries: Procedure(s): RIGHT TOTAL KNEE ARTHROPLASTY WITH COMPUTER NAVIGATION on 05/16/2017   Consultants (if any):   Discharged Condition: Improved  Hospital Course: Patrick Collier is an 77 y.o. male who was admitted 05/16/2017 with a diagnosis of Osteoarthritis of right knee and went to the operating room on 05/16/2017 and underwent the above named procedures.    He was given perioperative antibiotics:  Anti-infectives (From admission, onward)   Start     Dose/Rate Route Frequency Ordered Stop   05/16/17 1700  ceFAZolin (ANCEF) IVPB 2g/100 mL premix     2 g 200 mL/hr over 30 Minutes Intravenous Every 6 hours 05/16/17 1510 05/17/17 0029   05/16/17 0802  ceFAZolin (ANCEF) IVPB 2g/100 mL premix     2 g 200 mL/hr over 30 Minutes Intravenous On call to O.R. 05/16/17 0803 05/16/17 1112    .  He was given sequential compression devices, early ambulation, and ASA for DVT prophylaxis.  He benefited maximally from the hospital stay and there were no complications.    Recent vital signs:  Vitals:   05/17/17 2159 05/18/17 0641  BP: 128/77 124/69  Pulse: 77 63  Resp: 19 17  Temp: 98.9 F (37.2 C) 98.8 F (37.1 C)  SpO2: 91% 95%    Recent laboratory studies:  Lab Results  Component Value Date   HGB 10.4 (L) 05/18/2017   HGB 11.5 (L) 05/17/2017   HGB 14.5 05/09/2017   Lab Results  Component Value Date   WBC 13.7 (H) 05/18/2017   PLT 228 05/18/2017   No results found for: INR Lab Results  Component Value Date   NA 139 05/17/2017    K 3.9 05/17/2017   CL 104 05/17/2017   CO2 28 05/17/2017   BUN 15 05/17/2017   CREATININE 0.93 05/17/2017   GLUCOSE 125 (H) 05/17/2017    Discharge Medications:   Allergies as of 05/18/2017   No Known Allergies     Medication List    STOP taking these medications   IBU-200 PO     TAKE these medications   aspirin 81 MG chewable tablet Chew 1 tablet (81 mg total) by mouth 2 (two) times daily.   docusate sodium 100 MG capsule Commonly known as:  COLACE Take 1 capsule (100 mg total) by mouth 2 (two) times daily.   HYDROcodone-acetaminophen 5-325 MG tablet Commonly known as:  NORCO/VICODIN Take 1 tablet by mouth every 4 (four) hours as needed.   naproxen 500 MG tablet Commonly known as:  NAPROSYN Take 500 mg by mouth 2 (two) times daily.   omeprazole 20 MG capsule Commonly known as:  PRILOSEC Take 20 mg by mouth 2 (two) times daily.   ondansetron 4 MG tablet Commonly known as:  ZOFRAN Take 1 tablet (4 mg total) by mouth every 6 (six) hours as needed for nausea.   senna 8.6 MG Tabs tablet Commonly known as:  SENOKOT Take 2 tablets (17.2 mg total) by mouth at bedtime.   TYLENOL 8 HOUR 650 MG CR tablet Generic drug:  acetaminophen Take 500 mg by mouth every 8 (eight) hours as needed for pain.       Diagnostic Studies: Dg Knee Right Port  Result Date: 05/16/2017 CLINICAL DATA:  Total knee replacement EXAM: PORTABLE RIGHT KNEE - 1-2 VIEW COMPARISON:  None. FINDINGS: Frontal and lateral views were obtained. The patient is status post total knee replacement with prosthetic components well-seated. There is soft tissue air in the joint. No joint effusion. No fracture or dislocation. No erosive change. IMPRESSION: Total knee replacement prosthetic components well-seated. Soft tissue air in the joint is an expected postoperative finding. No fracture, dislocation, or joint effusion. Electronically Signed   By: Lowella Grip III M.D.   On: 05/16/2017 14:20     Disposition: 01-Home or Self Care  Discharge Instructions    Call MD / Call 911   Complete by:  As directed    If you experience chest pain or shortness of breath, CALL 911 and be transported to the hospital emergency room.  If you develope a fever above 101 F, pus (white drainage) or increased drainage or redness at the wound, or calf pain, call your surgeon's office.   Constipation Prevention   Complete by:  As directed    Drink plenty of fluids.  Prune juice may be helpful.  You may use a stool softener, such as Colace (over the counter) 100 mg twice a day.  Use MiraLax (over the counter) for constipation as needed.   Diet - low sodium heart healthy   Complete by:  As directed    Increase activity slowly as tolerated   Complete by:  As directed       Follow-up Information    Laylanie Kruczek, Aaron Edelman, MD. Schedule an appointment as soon as possible for a visit in 2 week(s).   Specialty:  Orthopedic Surgery Why:  For wound re-check Contact information: [address] [place] [address] [phone]           Signed: Hilton Cork Jaymere Alen 05/21/2017, 2:27 PM

## 2017-05-17 NOTE — Progress Notes (Signed)
Consult -SNF  Patient discharge plan prearranged by surgeon, outpatient PT.  CSW signing off.   Kathrin Greathouse, Latanya Presser, MSW Clinical Social Worker  [phone] 05/17/2017  9:26 AM

## 2017-05-17 NOTE — Progress Notes (Signed)
Occupational Therapy Evaluation  PTA, pt lived at home with wife and was independent with ADL and mobility. Pt able to ambulate to bathroom and around room and began to participate in ADL tasks without complaints of dizziness or nausea @ RW level with overall min A. Will follow acutely to address established goals to facilitate safe DC home.    05/17/17 0900  OT Visit Information  Last OT Received On 05/17/17  Assistance Needed +2 (Dizzy on eval)  History of Present Illness Pt s/p R TKR and with hx of previous back surgery  Precautions  Precautions Knee  Restrictions  Weight Bearing Restrictions No  Other Position/Activity Restrictions WBAT  Home Living  Family/patient expects to be discharged to: Private residence  Living Arrangements Spouse/significant other  Available Help at Discharge Family;Available 24 hours/day  Type of Home House  Home Access Stairs to enter  Entrance Stairs-Number of Steps 3  Entrance Stairs-Rails Right;Left;Can reach both  Home Layout Able to live on main level with bedroom/bathroom  Print production planner Handicapped height  Bathroom Accessibility Yes  How Accessible Accessible via walker  Iron Post - 2 wheels;Cane - single point;Crutches;BSC;Grab bars - tub/shower;Shower seat - built in  Prior Function  Level of Independence Independent  Communication  Communication No difficulties  Pain Assessment  Pain Assessment 0-10  Pain Score 3  Pain Location R knee  Pain Descriptors / Indicators Aching;Sore;Throbbing  Pain Intervention(s) Limited activity within patient's tolerance;Repositioned;Ice applied  Cognition  Arousal/Alertness Awake/alert  Behavior During Therapy WFL for tasks assessed/performed  Overall Cognitive Status Within Functional Limits for tasks assessed  Upper Extremity Assessment  Upper Extremity Assessment Overall WFL for tasks assessed  Lower Extremity Assessment  Lower Extremity  Assessment Defer to PT evaluation  Cervical / Trunk Assessment  Cervical / Trunk Assessment Normal (s/p L4-5 back fusion in 2009 and cervical fusion in 2014)  ADL  Overall ADL's  Needs assistance/impaired  Grooming Set up;Supervision/safety;Standing  Upper Body Bathing Set up;Sitting  Lower Body Bathing Minimal assistance;Sit to/from stand  Upper Body Dressing  Set up;Sitting  Lower Body Dressing Minimal assistance;Sit to/from stand  Toilet Transfer Minimal assistance;RW;Ambulation  Toileting- Clothing Manipulation and Hygiene Minimal assistance;Sit to/from Education officer, community Details (indicate cue type and reason) Began education on shower transfer techniques and home safety  Functional mobility during ADLs Minimal assistance;Rolling walker;Cueing for sequencing  Bed Mobility  Overal bed mobility Needs Assistance  Bed Mobility Supine to Sit  Supine to sit Supervision  Transfers  Overall transfer level Needs assistance  Equipment used Rolling walker (2 wheeled)  Transfers Sit to/from Stand  Sit to Stand Min guard  General transfer comment cues for LE management and use of UEs to self assist  Balance  Overall balance assessment Needs assistance  Sitting balance-Leahy Scale Good  Standing balance-Leahy Scale Poor  Standing balance comment dependent on RW for support  OT - End of Session  Equipment Utilized During Treatment Gait belt;Rolling walker  Activity Tolerance Patient tolerated treatment well  Patient left in chair;with call bell/phone within reach;with chair alarm set  Nurse Communication Mobility status  OT Assessment  OT Recommendation/Assessment Patient needs continued OT Services  OT Visit Diagnosis Other abnormalities of gait and mobility (R26.89);Pain  Pain - Right/Left Right  Pain - part of body Knee  OT Problem List Decreased strength;Decreased range of motion;Decreased safety awareness;Decreased knowledge of use of DME or AE;Pain  OT Plan  OT Frequency  (ACUTE ONLY) Min 2X/week  OT Treatment/Interventions (ACUTE ONLY) Self-care/ADL training;DME and/or AE instruction;Therapeutic activities;Patient/family education  AM-PAC OT "6 Clicks" Daily Activity Outcome Measure  Help from another person eating meals? 4  Help from another person taking care of personal grooming? 4  Help from another person toileting, which includes using toliet, bedpan, or urinal? 3  Help from another person bathing (including washing, rinsing, drying)? 3  Help from another person to put on and taking off regular upper body clothing? 4  Help from another person to put on and taking off regular lower body clothing? 3  6 Click Score 21  ADL G Code Conversion CJ  OT Recommendation  Follow Up Recommendations No OT follow up;Supervision - Intermittent  OT Equipment 3 in 1 bedside commode  Individuals Consulted  Consulted and Agree with Results and Recommendations Patient  Acute Rehab OT Goals  Patient Stated Goal Regain IND and get back to gym  OT Goal Formulation With patient  Time For Goal Achievement 05/31/17  Potential to Achieve Goals Good  OT Time Calculation  OT Start Time (ACUTE ONLY) 0936  OT Stop Time (ACUTE ONLY) 1010  OT Time Calculation (min) 34 min  OT Evaluation  $OT Eval Low Complexity 1 Low  OT Treatments  $Self Care/Home Management  8-22 mins  Written Expression  Dominant Hand Right  [facility], OT/L  [phone] 05/17/2017

## 2017-05-17 NOTE — Progress Notes (Signed)
    Subjective:  Patient reports pain as mild to moderate.  Denies CP/SOB. Had N/V yesterday - improved today.  Objective:   VITALS:   Vitals:   05/16/17 2213 05/17/17 0208 05/17/17 0336 05/17/17 0607  BP: 117/67 (!) 107/58  109/65  Pulse: 67 72  67  Resp: 16 18  18   Temp: 98.5 F (36.9 C) 99.7 F (37.6 C) 99.5 F (37.5 C) 99.3 F (37.4 C)  TempSrc: Oral Oral  Oral  SpO2: 99% 98%  91%  Weight:      Height:        NAD ABD SNT ABD soft Sensation intact distally Intact pulses distally Dorsiflexion/Plantar flexion intact Incision: dressing C/D/I Compartment soft   Lab Results  Component Value Date   WBC 9.2 05/17/2017   HGB 11.5 (L) 05/17/2017   HCT 34.3 (L) 05/17/2017   MCV 98.6 05/17/2017   PLT 222 05/17/2017   BMET    Component Value Date/Time   NA 139 05/17/2017 0526   K 3.9 05/17/2017 0526   CL 104 05/17/2017 0526   CO2 28 05/17/2017 0526   GLUCOSE 125 (H) 05/17/2017 0526   BUN 15 05/17/2017 0526   CREATININE 0.93 05/17/2017 0526   CALCIUM 8.2 (L) 05/17/2017 0526   GFRNONAA >60 05/17/2017 0526   GFRAA >60 05/17/2017 0526     Assessment/Plan: 1 Day Post-Op   Active Problems:   Osteoarthritis of right knee   WBAT with walker DVT ppx: ASA, SCDs, TEDS PO pain control PT/OT Dispo: D/C home when N/V resolves completely - hopefully tomorrow, outpatient PT set up   [employer] 05/17/2017, 8:30 AM   Rod Can, MD Cell [phone]

## 2017-05-17 NOTE — Progress Notes (Signed)
Physical Therapy Treatment Patient Details Name: Patrick Collier MRN: [id number] DOB: December 21, 1940 Today's Date: 05/17/2017    History of Present Illness Pt s/p R TKR and with hx of previous back surgery    PT Comments    Pt moving slowly but with marked improvement in activity tolerance and ltd c/o nausea.   Follow Up Recommendations  DC plan and follow up therapy as arranged by surgeon     Equipment Recommendations  None recommended by PT    Recommendations for Other Services OT consult     Precautions / Restrictions Precautions Precautions: Knee Restrictions Weight Bearing Restrictions: No Other Position/Activity Restrictions: WBAT    Mobility  Bed Mobility Overal bed mobility: Needs Assistance Bed Mobility: Supine to Sit     Supine to sit: Min guard     General bed mobility comments: cues for sequence and use of L LE to self assist.    Transfers Overall transfer level: Needs assistance Equipment used: Rolling walker (2 wheeled) Transfers: Sit to/from Stand Sit to Stand: Min guard         General transfer comment: cues for LE management and use of UEs to self assist  Ambulation/Gait Ambulation/Gait assistance: Min assist Ambulation Distance (Feet): 50 Feet Assistive device: Rolling walker (2 wheeled) Gait Pattern/deviations: Step-to pattern;Decreased step length - right;Decreased step length - left;Shuffle;Trunk flexed Gait velocity: decr Gait velocity interpretation: Below normal speed for age/gender General Gait Details: cues for posture, sequence and position from RW.   Stairs            Wheelchair Mobility    Modified Rankin (Stroke Patients Only)       Balance Overall balance assessment: Needs assistance Sitting-balance support: No upper extremity supported Sitting balance-Leahy Scale: Good       Standing balance-Leahy Scale: Poor Standing balance comment: dependent on RW for support                             Cognition Arousal/Alertness: Awake/alert Behavior During Therapy: WFL for tasks assessed/performed Overall Cognitive Status: Within Functional Limits for tasks assessed                                        Exercises Total Joint Exercises Ankle Circles/Pumps: AROM;Both;Supine;20 reps Quad Sets: AROM;Both;10 reps;Supine Heel Slides: AAROM;Right;15 reps;Supine Straight Leg Raises: AAROM;AROM;Right;15 reps;Supine Goniometric ROM: AAROM R knee -10 - 45    General Comments        Pertinent Vitals/Pain Pain Assessment: 0-10 Pain Score: 4  Pain Location: R knee Pain Descriptors / Indicators: Aching;Sore Pain Intervention(s): Limited activity within patient's tolerance;Monitored during session;Premedicated before session;Ice applied    Home Living Family/patient expects to be discharged to:: Private residence Living Arrangements: Spouse/significant other Available Help at Discharge: Family;Available 24 hours/day Type of Home: House Home Access: Stairs to enter Entrance Stairs-Rails: Right;Left;Can reach both Home Layout: Able to live on main level with bedroom/bathroom Home Equipment: Walker - 2 wheels;Cane - single point;Crutches;Bedside commode;Grab bars - tub/shower;Shower seat - built in      Prior Function Level of Independence: Independent          PT Goals (current goals can now be found in the care plan section) Acute Rehab PT Goals Patient Stated Goal: Regain IND and get back to gym PT Goal Formulation: With patient Time For Goal Achievement: 05/23/17 Potential to Achieve  Goals: Good Progress towards PT goals: Progressing toward goals    Frequency    7X/week      PT Plan Current plan remains appropriate    Co-evaluation              AM-PAC PT "6 Clicks" Daily Activity  Outcome Measure  Difficulty turning over in bed (including adjusting bedclothes, sheets and blankets)?: A Lot Difficulty moving from lying on back to sitting on  the side of the bed? : A Lot Difficulty sitting down on and standing up from a chair with arms (e.g., wheelchair, bedside commode, etc,.)?: A Lot Help needed moving to and from a bed to chair (including a wheelchair)?: A Little Help needed walking in hospital room?: A Little Help needed climbing 3-5 steps with a railing? : A Little 6 Click Score: 15    End of Session Equipment Utilized During Treatment: Gait belt Activity Tolerance: Patient tolerated treatment well Patient left: in chair;with call bell/phone within reach Nurse Communication: Mobility status PT Visit Diagnosis: Difficulty in walking, not elsewhere classified (R26.2)     Time: 9604-5409 PT Time Calculation (min) (ACUTE ONLY): 40 min  Charges:  $Gait Training: 8-22 mins $Therapeutic Exercise: 8-22 mins $Therapeutic Activity: 8-22 mins                    G Codes:       WJ [phone]    Deny Chevez 05/17/2017, 12:56 PM

## 2017-05-17 NOTE — Progress Notes (Signed)
Physical Therapy Treatment Patient Details Name: Patrick Collier MRN: [id number] DOB: 1940/08/27 Today's Date: 05/17/2017    History of Present Illness Pt s/p R TKR and with hx of previous back surgery    PT Comments    Pt progressing well and hopeful for dc home tomorrow..   Follow Up Recommendations  DC plan and follow up therapy as arranged by surgeon     Equipment Recommendations  None recommended by PT    Recommendations for Other Services OT consult     Precautions / Restrictions Precautions Precautions: Knee Restrictions Weight Bearing Restrictions: No Other Position/Activity Restrictions: WBAT    Mobility  Bed Mobility Overal bed mobility: Needs Assistance Bed Mobility: Supine to Sit;Sit to Supine     Supine to sit: Min guard Sit to supine: Min guard   General bed mobility comments: cues for sequence and use of L LE to self assist.    Transfers Overall transfer level: Needs assistance Equipment used: Rolling walker (2 wheeled) Transfers: Sit to/from Stand Sit to Stand: Min guard         General transfer comment: cues for LE management and use of UEs to self assist  Ambulation/Gait Ambulation/Gait assistance: Min assist;Min guard Ambulation Distance (Feet): 150 Feet Assistive device: Rolling walker (2 wheeled) Gait Pattern/deviations: Step-to pattern;Decreased step length - right;Decreased step length - left;Shuffle;Trunk flexed Gait velocity: decr Gait velocity interpretation: Below normal speed for age/gender General Gait Details: cues for posture, sequence and position from RW.   Stairs            Wheelchair Mobility    Modified Rankin (Stroke Patients Only)       Balance                                            Cognition Arousal/Alertness: Awake/alert Behavior During Therapy: WFL for tasks assessed/performed Overall Cognitive Status: Within Functional Limits for tasks assessed                                        Exercises Total Joint Exercises Ankle Circles/Pumps: AROM;Both;Supine;20 reps Quad Sets: AROM;Both;10 reps;Supine Heel Slides: AAROM;Right;Supine;20 reps Straight Leg Raises: AAROM;AROM;Right;15 reps;Supine Goniometric ROM: AAROM R knee -10 - 60    General Comments        Pertinent Vitals/Pain Pain Assessment: 0-10 Pain Score: 4  Pain Location: R knee Pain Descriptors / Indicators: Aching;Sore Pain Intervention(s): Limited activity within patient's tolerance;Monitored during session;Premedicated before session;Ice applied    Home Living                      Prior Function            PT Goals (current goals can now be found in the care plan section) Acute Rehab PT Goals Patient Stated Goal: Regain IND and get back to gym PT Goal Formulation: With patient Time For Goal Achievement: 05/23/17 Potential to Achieve Goals: Good Progress towards PT goals: Progressing toward goals    Frequency    7X/week      PT Plan Current plan remains appropriate    Co-evaluation              AM-PAC PT "6 Clicks" Daily Activity  Outcome Measure  Difficulty turning over in bed (including adjusting bedclothes,  sheets and blankets)?: A Lot Difficulty moving from lying on back to sitting on the side of the bed? : A Lot Difficulty sitting down on and standing up from a chair with arms (e.g., wheelchair, bedside commode, etc,.)?: A Lot Help needed moving to and from a bed to chair (including a wheelchair)?: A Little Help needed walking in hospital room?: A Little Help needed climbing 3-5 steps with a railing? : A Little 6 Click Score: 15    End of Session Equipment Utilized During Treatment: Gait belt Activity Tolerance: Patient tolerated treatment well Patient left: with call bell/phone within reach;in bed;with family/visitor present Nurse Communication: Mobility status PT Visit Diagnosis: Difficulty in walking, not elsewhere classified  (R26.2)     Time: 5916-3846 PT Time Calculation (min) (ACUTE ONLY): 43 min  Charges:  $Gait Training: 23-37 mins $Therapeutic Exercise: 8-22 mins                    G Codes:       Pg [phone]    Quest Tavenner 05/17/2017, 6:32 PM

## 2017-05-18 LAB — CBC
HCT: 30.7 % — ABNORMAL LOW (ref 39.0–52.0)
Hemoglobin: 10.4 g/dL — ABNORMAL LOW (ref 13.0–17.0)
MCH: 33.1 pg (ref 26.0–34.0)
MCHC: 33.9 g/dL (ref 30.0–36.0)
MCV: 97.8 fL (ref 78.0–100.0)
PLATELETS: 228 10*3/uL (ref 150–400)
RBC: 3.14 MIL/uL — ABNORMAL LOW (ref 4.22–5.81)
RDW: 12.4 % (ref 11.5–15.5)
WBC: 13.7 10*3/uL — AB (ref 4.0–10.5)

## 2017-05-18 NOTE — Progress Notes (Signed)
Occupational Therapy Treatment Patient Details Name: Patrick Collier MRN: [id number] DOB: Oct 06, 1940 Today's Date: 05/18/2017    History of present illness Pt s/p R TKR and with hx of previous back surgery   OT comments  Pt. Able to complete UB/LB B/D with focus on LB ADL education.  Reviewed shower stall transfer tech.  Pt. With d/c home later today.  OTR/L to sign off.    Follow Up Recommendations  No OT follow up;Supervision - Intermittent    Equipment Recommendations  3 in 1 bedside commode    Recommendations for Other Services      Precautions / Restrictions Precautions Precautions: Knee Restrictions Other Position/Activity Restrictions: WBAT       Mobility Bed Mobility               General bed mobility comments: seated in recliner at beginning and end of tx. session  Transfers Overall transfer level: Needs assistance Equipment used: Rolling walker (2 wheeled) Transfers: Sit to/from Stand Sit to Stand: Supervision              Balance                                           ADL either performed or assessed with clinical judgement   ADL Overall ADL's : Needs assistance/impaired             Lower Body Bathing: Supervison/ safety;Sit to/from stand;Sitting/lateral leans                   Tub/ Shower Transfer: Walk-in shower;Ambulation;Shower seat;Anterior/posterior Tub/Shower Transfer Details (indicate cue type and reason): reviewed instructions with demo of shower transfer techniques and home safety, advised to have wife with him during transfer.  Functional mobility during ADLs: Min guard;Rolling walker General ADL Comments: reviewed tech. for partially donning underwear and pants before standing up for energy conservation.  reviewed shower stall transfer tech. multiple times to remind him rw does NOT come into the shower stall he will step backwards over the ledge     Vision       Perception     Praxis       Cognition Arousal/Alertness: Awake/alert Behavior During Therapy: WFL for tasks assessed/performed Overall Cognitive Status: Within Functional Limits for tasks assessed                                          Exercises     Shoulder Instructions       General Comments      Pertinent Vitals/ Pain       Pain Assessment: No/denies pain  Home Living                                          Prior Functioning/Environment              Frequency  Min 2X/week        Progress Toward Goals  OT Goals(current goals can now be found in the care plan section)  Progress towards OT goals: Goals met/education completed, patient discharged from [facility]  AM-PAC PT "6 Clicks" Daily Activity     Outcome Measure   Help from another person eating meals?: None Help from another person taking care of personal grooming?: None Help from another person toileting, which includes using toliet, bedpan, or urinal?: A Little Help from another person bathing (including washing, rinsing, drying)?: A Little Help from another person to put on and taking off regular upper body clothing?: None Help from another person to put on and taking off regular lower body clothing?: A Little 6 Click Score: 21    End of Session Equipment Utilized During Treatment: Rolling walker  OT Visit Diagnosis: Other abnormalities of gait and mobility (R26.89);Pain Pain - Right/Left: Right Pain - part of body: Knee   Activity Tolerance Patient tolerated treatment well   Patient Left in chair;with call bell/phone within reach;with chair alarm set   Nurse Communication          Time: 5733-4483 OT Time Calculation (min): 22 min  Charges: OT General Charges $OT Visit: 1 Visit OT Treatments $Self Care/Home Management : 23-37 mins   Janice Coffin, COTA/L 05/18/2017, 9:10 AM

## 2017-05-18 NOTE — Progress Notes (Signed)
Subjective: 2 Days Post-Op Procedure(s) (LRB): RIGHT TOTAL KNEE ARTHROPLASTY WITH COMPUTER NAVIGATION (Right) Patient reports pain as 3 on 0-10 scale.    Objective: Vital signs in last 24 hours: Temp:  [98.8 F (37.1 C)-98.9 F (37.2 C)] 98.8 F (37.1 C) (01/26 0641) Pulse Rate:  [63-77] 63 (01/26 0641) Resp:  [17-19] 17 (01/26 0641) BP: (124-128)/(69-77) 124/69 (01/26 0641) SpO2:  [91 %-95 %] 95 % (01/26 0641)  Intake/Output from previous day: 01/25 0701 - 01/26 0700 In: 600 [P.O.:600] Out: 625 [Urine:625] Intake/Output this shift: No intake/output data recorded.  Recent Labs    05/17/17 0526 05/18/17 0524  HGB 11.5* 10.4*   Recent Labs    05/17/17 0526 05/18/17 0524  WBC 9.2 13.7*  RBC 3.48* 3.14*  HCT 34.3* 30.7*  PLT 222 228   Recent Labs    05/17/17 0526  NA 139  K 3.9  CL 104  CO2 28  BUN 15  CREATININE 0.93  GLUCOSE 125*  CALCIUM 8.2*   No results for input(s): LABPT, INR in the last 72 hours.  Neurologically intact Sensation intact distally Intact pulses distally Dorsiflexion/Plantar flexion intact Incision: dressing C/D/I and no drainage Moderate swelling. compts soft  Assessment/Plan: 2 Days Post-Op Procedure(s) (LRB): RIGHT TOTAL KNEE ARTHROPLASTY WITH COMPUTER NAVIGATION (Right) Advance diet Up with therapy Discharge home with home health  Elevate at home discussed.  Amelya Mabry C 05/18/2017, 7:45 AM

## 2017-05-18 NOTE — Care Management Note (Signed)
Case Management Note  Patient Details  Name: Patrick Collier MRN: [id number] Date of Birth: [birth year]  Subjective/Objective:    Right TKA                Action/Plan: Discharge Planning: NCM spoke to pt and he was set up for outpt PT at [facility]. Has appt on Monday at 11 am. He has RW, cane, crutches, and bedside commode at home. Wife at home to assist with care.    Expected Discharge Date:  05/18/17               Expected Discharge Plan:  Home/Self Care  In-House Referral:  NA  Discharge planning Services  CM Consult  Post Acute Care Choice:  NA Choice offered to:  NA  DME Arranged:  N/A DME Agency:  NA  HH Arranged:  NA HH Agency:  NA  Status of Service:  Completed, signed off  If discussed at [place] of Stay Meetings, dates discussed:    Additional Comments:  Erenest Rasher, RN 05/18/2017, 10:46 AM

## 2017-05-18 NOTE — Progress Notes (Signed)
Discharge instructions reviewed with patient and spouse, incision is within normal limits, vital signs are stable, prescriptions are given, patient to follow up with surgeon, ice packs were given as well, patient and spouse verbalized understanding of discharge instructions, questions and concerns answered Neta Mends RN 12:10 PM 05-18-2017

## 2017-05-18 NOTE — Progress Notes (Signed)
Subjective: 2 Days Post-Op Procedure(s) (LRB): RIGHT TOTAL KNEE ARTHROPLASTY WITH COMPUTER NAVIGATION (Right) Patient reports pain as 3 on 0-10 scale.    Objective: Vital signs in last 24 hours: Temp:  [98.8 F (37.1 C)-98.9 F (37.2 C)] 98.8 F (37.1 C) (01/26 0641) Pulse Rate:  [63-77] 63 (01/26 0641) Resp:  [17-19] 17 (01/26 0641) BP: (124-128)/(69-77) 124/69 (01/26 0641) SpO2:  [91 %-95 %] 95 % (01/26 0641)  Intake/Output from previous day: 01/25 0701 - 01/26 0700 In: 600 [P.O.:600] Out: 625 [Urine:625] Intake/Output this shift: No intake/output data recorded.  Recent Labs    05/17/17 0526 05/18/17 0524  HGB 11.5* 10.4*   Recent Labs    05/17/17 0526 05/18/17 0524  WBC 9.2 13.7*  RBC 3.48* 3.14*  HCT 34.3* 30.7*  PLT 222 228   Recent Labs    05/17/17 0526  NA 139  K 3.9  CL 104  CO2 28  BUN 15  CREATININE 0.93  GLUCOSE 125*  CALCIUM 8.2*   No results for input(s): LABPT, INR in the last 72 hours.  Neurologically intact Neurovascular intact Intact pulses distally Dorsiflexion/Plantar flexion intact Incision: dressing C/D/I  Assessment/Plan: 2 Days Post-Op Procedure(s) (LRB): RIGHT TOTAL KNEE ARTHROPLASTY WITH COMPUTER NAVIGATION (Right) Advance diet Up with therapy Discharge home with home health  Aniruddh Ciavarella C 05/18/2017, 7:55 AM

## 2017-05-18 NOTE — Progress Notes (Signed)
Physical Therapy Treatment Patient Details Name: Patrick Collier MRN: [id number] DOB: 07-20-1940 Today's Date: 05/18/2017    History of Present Illness Pt s/p R TKR and with hx of previous back surgery    PT Comments    Pt continues motivated and progressing steadily with mobility. Reviewed stairs and home therex with written instruction provided.   Follow Up Recommendations  DC plan and follow up therapy as arranged by surgeon     Equipment Recommendations  None recommended by PT    Recommendations for Other Services OT consult     Precautions / Restrictions Precautions Precautions: Knee Restrictions Weight Bearing Restrictions: No Other Position/Activity Restrictions: WBAT    Mobility  Bed Mobility Overal bed mobility: Needs Assistance Bed Mobility: Supine to Sit     Supine to sit: Supervision     General bed mobility comments: min cues for sequence  Transfers Overall transfer level: Needs assistance Equipment used: Rolling walker (2 wheeled) Transfers: Sit to/from Stand Sit to Stand: Supervision         General transfer comment: cues for LE management and use of UEs to self assist  Ambulation/Gait Ambulation/Gait assistance: Min guard;Supervision Ambulation Distance (Feet): 150 Feet Assistive device: Rolling walker (2 wheeled) Gait Pattern/deviations: Decreased step length - right;Decreased step length - left;Shuffle;Trunk flexed;Step-to pattern;Step-through pattern Gait velocity: decr Gait velocity interpretation: Below normal speed for age/gender General Gait Details: cues for posture, sequence and position from RW.   Stairs Stairs: Yes   Stair Management: Two rails;Step to pattern;Forwards Number of Stairs: 7 General stair comments: cues for sequence and foot placement  Wheelchair Mobility    Modified Rankin (Stroke Patients Only)       Balance Overall balance assessment: Needs assistance Sitting-balance support: No upper extremity  supported Sitting balance-Leahy Scale: Good       Standing balance-Leahy Scale: Fair                              Cognition Arousal/Alertness: Awake/alert Behavior During Therapy: WFL for tasks assessed/performed Overall Cognitive Status: Within Functional Limits for tasks assessed                                        Exercises Total Joint Exercises Ankle Circles/Pumps: AROM;Both;Supine;20 reps Quad Sets: AROM;Both;Supine;15 reps Short Arc Quad: AROM;Both;10 reps;Supine Heel Slides: AAROM;Right;Supine;20 reps Straight Leg Raises: AAROM;AROM;Right;Supine;20 reps    General Comments        Pertinent Vitals/Pain Pain Assessment: 0-10 Pain Score: 3  Pain Location: R knee Pain Descriptors / Indicators: Aching;Sore Pain Intervention(s): Limited activity within patient's tolerance;Monitored during session;Premedicated before session;Ice applied    Home Living                      Prior Function            PT Goals (current goals can now be found in the care plan section) Acute Rehab PT Goals Patient Stated Goal: Regain IND and get back to gym PT Goal Formulation: With patient Time For Goal Achievement: 05/23/17 Potential to Achieve Goals: Good Progress towards PT goals: Progressing toward goals    Frequency    7X/week      PT Plan Current plan remains appropriate    Co-evaluation              AM-PAC PT "6 Clicks"  Daily Activity  Outcome Measure  Difficulty turning over in bed (including adjusting bedclothes, sheets and blankets)?: A Lot Difficulty moving from lying on back to sitting on the side of the bed? : A Lot Difficulty sitting down on and standing up from a chair with arms (e.g., wheelchair, bedside commode, etc,.)?: A Little Help needed moving to and from a bed to chair (including a wheelchair)?: A Little Help needed walking in hospital room?: A Little Help needed climbing 3-5 steps with a railing? : A  Little 6 Click Score: 16    End of Session Equipment Utilized During Treatment: Gait belt Activity Tolerance: Patient tolerated treatment well Patient left: in chair;with call bell/phone within reach Nurse Communication: Mobility status PT Visit Diagnosis: Difficulty in walking, not elsewhere classified (R26.2)     Time: 9295-7473 PT Time Calculation (min) (ACUTE ONLY): 51 min  Charges:  $Gait Training: 8-22 mins $Therapeutic Exercise: 8-22 mins $Therapeutic Activity: 8-22 mins                    G Codes:       Pg [phone]    Hajra Port 05/18/2017, 12:33 PM

## 2017-05-20 DIAGNOSIS — M25661 Stiffness of right knee, not elsewhere classified: Secondary | ICD-10-CM | POA: Diagnosis not present

## 2017-05-22 DIAGNOSIS — M25661 Stiffness of right knee, not elsewhere classified: Secondary | ICD-10-CM | POA: Diagnosis not present

## 2017-05-24 DIAGNOSIS — M25661 Stiffness of right knee, not elsewhere classified: Secondary | ICD-10-CM | POA: Diagnosis not present

## 2017-05-28 DIAGNOSIS — M25661 Stiffness of right knee, not elsewhere classified: Secondary | ICD-10-CM | POA: Diagnosis not present

## 2017-05-30 DIAGNOSIS — M25661 Stiffness of right knee, not elsewhere classified: Secondary | ICD-10-CM | POA: Diagnosis not present

## 2017-06-03 DIAGNOSIS — M25661 Stiffness of right knee, not elsewhere classified: Secondary | ICD-10-CM | POA: Diagnosis not present

## 2017-06-04 DIAGNOSIS — Z96651 Presence of right artificial knee joint: Secondary | ICD-10-CM | POA: Diagnosis not present

## 2017-06-04 DIAGNOSIS — Z471 Aftercare following joint replacement surgery: Secondary | ICD-10-CM | POA: Diagnosis not present

## 2017-06-06 DIAGNOSIS — M25661 Stiffness of right knee, not elsewhere classified: Secondary | ICD-10-CM | POA: Diagnosis not present

## 2017-06-10 DIAGNOSIS — M25661 Stiffness of right knee, not elsewhere classified: Secondary | ICD-10-CM | POA: Diagnosis not present

## 2017-06-13 DIAGNOSIS — M25661 Stiffness of right knee, not elsewhere classified: Secondary | ICD-10-CM | POA: Diagnosis not present

## 2017-06-19 DIAGNOSIS — M25661 Stiffness of right knee, not elsewhere classified: Secondary | ICD-10-CM | POA: Diagnosis not present

## 2017-06-21 DIAGNOSIS — M25661 Stiffness of right knee, not elsewhere classified: Secondary | ICD-10-CM | POA: Diagnosis not present

## 2017-06-26 DIAGNOSIS — M25661 Stiffness of right knee, not elsewhere classified: Secondary | ICD-10-CM | POA: Diagnosis not present

## 2017-06-28 DIAGNOSIS — M25661 Stiffness of right knee, not elsewhere classified: Secondary | ICD-10-CM | POA: Diagnosis not present

## 2017-07-01 DIAGNOSIS — M25661 Stiffness of right knee, not elsewhere classified: Secondary | ICD-10-CM | POA: Diagnosis not present

## 2017-07-02 DIAGNOSIS — Z471 Aftercare following joint replacement surgery: Secondary | ICD-10-CM | POA: Diagnosis not present

## 2017-07-02 DIAGNOSIS — Z96651 Presence of right artificial knee joint: Secondary | ICD-10-CM | POA: Diagnosis not present

## 2017-07-29 DIAGNOSIS — L821 Other seborrheic keratosis: Secondary | ICD-10-CM | POA: Diagnosis not present

## 2017-07-29 DIAGNOSIS — L570 Actinic keratosis: Secondary | ICD-10-CM | POA: Diagnosis not present

## 2017-07-29 DIAGNOSIS — L282 Other prurigo: Secondary | ICD-10-CM | POA: Diagnosis not present

## 2017-07-29 DIAGNOSIS — L309 Dermatitis, unspecified: Secondary | ICD-10-CM | POA: Diagnosis not present

## 2017-07-29 DIAGNOSIS — L245 Irritant contact dermatitis due to other chemical products: Secondary | ICD-10-CM | POA: Diagnosis not present

## 2017-09-25 DIAGNOSIS — H2513 Age-related nuclear cataract, bilateral: Secondary | ICD-10-CM | POA: Diagnosis not present

## 2017-09-25 DIAGNOSIS — H5203 Hypermetropia, bilateral: Secondary | ICD-10-CM | POA: Diagnosis not present

## 2017-09-25 DIAGNOSIS — H52223 Regular astigmatism, bilateral: Secondary | ICD-10-CM | POA: Diagnosis not present

## 2017-09-25 DIAGNOSIS — H524 Presbyopia: Secondary | ICD-10-CM | POA: Diagnosis not present

## 2017-10-21 DIAGNOSIS — L821 Other seborrheic keratosis: Secondary | ICD-10-CM | POA: Diagnosis not present

## 2017-10-21 DIAGNOSIS — L570 Actinic keratosis: Secondary | ICD-10-CM | POA: Diagnosis not present

## 2017-10-21 DIAGNOSIS — L905 Scar conditions and fibrosis of skin: Secondary | ICD-10-CM | POA: Diagnosis not present

## 2017-10-21 DIAGNOSIS — D1801 Hemangioma of skin and subcutaneous tissue: Secondary | ICD-10-CM | POA: Diagnosis not present

## 2017-11-05 DIAGNOSIS — Z96651 Presence of right artificial knee joint: Secondary | ICD-10-CM | POA: Diagnosis not present

## 2017-11-05 DIAGNOSIS — M65311 Trigger thumb, right thumb: Secondary | ICD-10-CM | POA: Diagnosis not present

## 2018-01-16 DIAGNOSIS — E785 Hyperlipidemia, unspecified: Secondary | ICD-10-CM | POA: Diagnosis not present

## 2018-01-16 DIAGNOSIS — Z0000 Encounter for general adult medical examination without abnormal findings: Secondary | ICD-10-CM | POA: Diagnosis not present

## 2018-01-16 DIAGNOSIS — D7589 Other specified diseases of blood and blood-forming organs: Secondary | ICD-10-CM | POA: Diagnosis not present

## 2018-01-21 DIAGNOSIS — Z23 Encounter for immunization: Secondary | ICD-10-CM | POA: Diagnosis not present

## 2018-01-21 DIAGNOSIS — E785 Hyperlipidemia, unspecified: Secondary | ICD-10-CM | POA: Diagnosis not present

## 2018-01-21 DIAGNOSIS — M6530 Trigger finger, unspecified finger: Secondary | ICD-10-CM | POA: Diagnosis not present

## 2018-01-21 DIAGNOSIS — M25531 Pain in right wrist: Secondary | ICD-10-CM | POA: Diagnosis not present

## 2018-01-21 DIAGNOSIS — K4090 Unilateral inguinal hernia, without obstruction or gangrene, not specified as recurrent: Secondary | ICD-10-CM | POA: Diagnosis not present

## 2018-01-21 DIAGNOSIS — Z125 Encounter for screening for malignant neoplasm of prostate: Secondary | ICD-10-CM | POA: Diagnosis not present

## 2018-01-21 DIAGNOSIS — R011 Cardiac murmur, unspecified: Secondary | ICD-10-CM | POA: Diagnosis not present

## 2018-01-21 DIAGNOSIS — Z0000 Encounter for general adult medical examination without abnormal findings: Secondary | ICD-10-CM | POA: Diagnosis not present

## 2018-02-13 DIAGNOSIS — M79641 Pain in right hand: Secondary | ICD-10-CM | POA: Diagnosis not present

## 2018-02-13 DIAGNOSIS — M6530 Trigger finger, unspecified finger: Secondary | ICD-10-CM | POA: Diagnosis not present

## 2018-02-13 DIAGNOSIS — M654 Radial styloid tenosynovitis [de Quervain]: Secondary | ICD-10-CM | POA: Diagnosis not present

## 2018-02-24 DIAGNOSIS — L820 Inflamed seborrheic keratosis: Secondary | ICD-10-CM | POA: Diagnosis not present

## 2018-02-24 DIAGNOSIS — L570 Actinic keratosis: Secondary | ICD-10-CM | POA: Diagnosis not present

## 2018-03-13 DIAGNOSIS — M654 Radial styloid tenosynovitis [de Quervain]: Secondary | ICD-10-CM | POA: Diagnosis not present

## 2018-03-13 DIAGNOSIS — M6530 Trigger finger, unspecified finger: Secondary | ICD-10-CM | POA: Diagnosis not present

## 2018-03-13 DIAGNOSIS — M13842 Other specified arthritis, left hand: Secondary | ICD-10-CM | POA: Diagnosis not present

## 2018-05-06 DIAGNOSIS — Z471 Aftercare following joint replacement surgery: Secondary | ICD-10-CM | POA: Diagnosis not present

## 2018-05-06 DIAGNOSIS — Z96651 Presence of right artificial knee joint: Secondary | ICD-10-CM | POA: Diagnosis not present

## 2018-05-21 DIAGNOSIS — M65351 Trigger finger, right little finger: Secondary | ICD-10-CM | POA: Diagnosis not present

## 2018-05-21 DIAGNOSIS — M654 Radial styloid tenosynovitis [de Quervain]: Secondary | ICD-10-CM | POA: Diagnosis not present

## 2018-06-11 DIAGNOSIS — H6501 Acute serous otitis media, right ear: Secondary | ICD-10-CM | POA: Diagnosis not present

## 2018-11-19 DIAGNOSIS — M654 Radial styloid tenosynovitis [de Quervain]: Secondary | ICD-10-CM | POA: Diagnosis not present

## 2018-11-19 DIAGNOSIS — M65351 Trigger finger, right little finger: Secondary | ICD-10-CM | POA: Diagnosis not present

## 2018-11-19 DIAGNOSIS — M79641 Pain in right hand: Secondary | ICD-10-CM | POA: Diagnosis not present

## 2019-01-05 DIAGNOSIS — D485 Neoplasm of uncertain behavior of skin: Secondary | ICD-10-CM | POA: Diagnosis not present

## 2019-01-05 DIAGNOSIS — L570 Actinic keratosis: Secondary | ICD-10-CM | POA: Diagnosis not present

## 2019-01-05 DIAGNOSIS — L821 Other seborrheic keratosis: Secondary | ICD-10-CM | POA: Diagnosis not present

## 2019-01-05 DIAGNOSIS — D1801 Hemangioma of skin and subcutaneous tissue: Secondary | ICD-10-CM | POA: Diagnosis not present

## 2019-02-11 DIAGNOSIS — Z125 Encounter for screening for malignant neoplasm of prostate: Secondary | ICD-10-CM | POA: Diagnosis not present

## 2019-02-11 DIAGNOSIS — E785 Hyperlipidemia, unspecified: Secondary | ICD-10-CM | POA: Diagnosis not present

## 2019-02-11 DIAGNOSIS — D7589 Other specified diseases of blood and blood-forming organs: Secondary | ICD-10-CM | POA: Diagnosis not present

## 2019-02-11 DIAGNOSIS — Z0000 Encounter for general adult medical examination without abnormal findings: Secondary | ICD-10-CM | POA: Diagnosis not present

## 2019-02-16 DIAGNOSIS — Z125 Encounter for screening for malignant neoplasm of prostate: Secondary | ICD-10-CM | POA: Diagnosis not present

## 2019-02-16 DIAGNOSIS — R202 Paresthesia of skin: Secondary | ICD-10-CM | POA: Diagnosis not present

## 2019-02-16 DIAGNOSIS — Z0000 Encounter for general adult medical examination without abnormal findings: Secondary | ICD-10-CM | POA: Diagnosis not present

## 2019-02-16 DIAGNOSIS — E785 Hyperlipidemia, unspecified: Secondary | ICD-10-CM | POA: Diagnosis not present

## 2019-02-16 DIAGNOSIS — R945 Abnormal results of liver function studies: Secondary | ICD-10-CM | POA: Diagnosis not present

## 2019-03-23 DIAGNOSIS — E785 Hyperlipidemia, unspecified: Secondary | ICD-10-CM | POA: Diagnosis not present

## 2019-03-23 DIAGNOSIS — R945 Abnormal results of liver function studies: Secondary | ICD-10-CM | POA: Diagnosis not present

## 2019-07-14 DIAGNOSIS — H524 Presbyopia: Secondary | ICD-10-CM | POA: Diagnosis not present

## 2019-07-14 DIAGNOSIS — H5000 Unspecified esotropia: Secondary | ICD-10-CM | POA: Diagnosis not present

## 2019-07-22 DIAGNOSIS — M65351 Trigger finger, right little finger: Secondary | ICD-10-CM | POA: Diagnosis not present

## 2019-07-22 DIAGNOSIS — M65321 Trigger finger, right index finger: Secondary | ICD-10-CM | POA: Diagnosis not present

## 2019-07-22 DIAGNOSIS — Z4789 Encounter for other orthopedic aftercare: Secondary | ICD-10-CM | POA: Diagnosis not present

## 2019-07-29 DIAGNOSIS — M65321 Trigger finger, right index finger: Secondary | ICD-10-CM | POA: Diagnosis not present

## 2019-07-29 DIAGNOSIS — Z4789 Encounter for other orthopedic aftercare: Secondary | ICD-10-CM | POA: Diagnosis not present

## 2019-07-29 DIAGNOSIS — M65352 Trigger finger, left little finger: Secondary | ICD-10-CM | POA: Diagnosis not present

## 2019-07-29 DIAGNOSIS — M13842 Other specified arthritis, left hand: Secondary | ICD-10-CM | POA: Diagnosis not present

## 2019-07-29 DIAGNOSIS — M65351 Trigger finger, right little finger: Secondary | ICD-10-CM | POA: Diagnosis not present

## 2019-08-04 DIAGNOSIS — M654 Radial styloid tenosynovitis [de Quervain]: Secondary | ICD-10-CM | POA: Diagnosis not present

## 2019-08-11 DIAGNOSIS — M6530 Trigger finger, unspecified finger: Secondary | ICD-10-CM | POA: Diagnosis not present

## 2019-09-17 DIAGNOSIS — M65351 Trigger finger, right little finger: Secondary | ICD-10-CM | POA: Diagnosis not present

## 2019-09-17 DIAGNOSIS — Z4789 Encounter for other orthopedic aftercare: Secondary | ICD-10-CM | POA: Diagnosis not present

## 2019-09-17 DIAGNOSIS — G5601 Carpal tunnel syndrome, right upper limb: Secondary | ICD-10-CM | POA: Diagnosis not present

## 2019-09-17 DIAGNOSIS — M65321 Trigger finger, right index finger: Secondary | ICD-10-CM | POA: Diagnosis not present

## 2019-09-18 DIAGNOSIS — E785 Hyperlipidemia, unspecified: Secondary | ICD-10-CM | POA: Diagnosis not present

## 2019-09-18 DIAGNOSIS — R7309 Other abnormal glucose: Secondary | ICD-10-CM | POA: Diagnosis not present

## 2019-11-20 ENCOUNTER — Ambulatory Visit: Payer: Medicare Other | Admitting: Cardiology

## 2019-12-10 DIAGNOSIS — M79642 Pain in left hand: Secondary | ICD-10-CM | POA: Diagnosis not present

## 2019-12-10 DIAGNOSIS — M79641 Pain in right hand: Secondary | ICD-10-CM | POA: Diagnosis not present

## 2019-12-10 DIAGNOSIS — G5601 Carpal tunnel syndrome, right upper limb: Secondary | ICD-10-CM | POA: Diagnosis not present

## 2019-12-10 DIAGNOSIS — M65352 Trigger finger, left little finger: Secondary | ICD-10-CM | POA: Diagnosis not present

## 2019-12-30 DIAGNOSIS — Z23 Encounter for immunization: Secondary | ICD-10-CM | POA: Diagnosis not present

## 2020-01-12 DIAGNOSIS — L570 Actinic keratosis: Secondary | ICD-10-CM | POA: Diagnosis not present

## 2020-01-12 DIAGNOSIS — L821 Other seborrheic keratosis: Secondary | ICD-10-CM | POA: Diagnosis not present

## 2020-03-16 DIAGNOSIS — Z4789 Encounter for other orthopedic aftercare: Secondary | ICD-10-CM | POA: Diagnosis not present

## 2020-03-16 DIAGNOSIS — G5601 Carpal tunnel syndrome, right upper limb: Secondary | ICD-10-CM | POA: Diagnosis not present

## 2020-03-30 DIAGNOSIS — M79641 Pain in right hand: Secondary | ICD-10-CM | POA: Diagnosis not present

## 2020-04-04 DIAGNOSIS — R7309 Other abnormal glucose: Secondary | ICD-10-CM | POA: Diagnosis not present

## 2020-04-04 DIAGNOSIS — D7589 Other specified diseases of blood and blood-forming organs: Secondary | ICD-10-CM | POA: Diagnosis not present

## 2020-04-04 DIAGNOSIS — Z87891 Personal history of nicotine dependence: Secondary | ICD-10-CM | POA: Diagnosis not present

## 2020-04-04 DIAGNOSIS — Z0000 Encounter for general adult medical examination without abnormal findings: Secondary | ICD-10-CM | POA: Diagnosis not present

## 2020-04-04 DIAGNOSIS — E785 Hyperlipidemia, unspecified: Secondary | ICD-10-CM | POA: Diagnosis not present

## 2020-04-05 NOTE — Progress Notes (Signed)
Cardiology Clinic Note   Patient Name: Patrick Collier Date of Encounter: 04/06/2020  Primary Care Provider:  London Pepper, MD Primary Cardiologist:  Kirk Ruths, MD  Patient Profile    Patrick Collier 79 year old male presents the clinic today for follow-up evaluation of his cardiac murmur.  Past Medical History    Past Medical History:  Diagnosis Date  . Arthritis   . Back pain   . Heart murmur   . History of kidney stones   . ICH (intracerebral hemorrhage) ([place])    Past Surgical History:  Procedure Laterality Date  . BACK SURGERY    . INGUINAL HERNIA REPAIR    . KNEE ARTHROPLASTY Right 05/16/2017   Procedure: RIGHT TOTAL KNEE ARTHROPLASTY WITH COMPUTER NAVIGATION;  Surgeon: Rod Can, MD;  Location: [facility];  Service: Orthopedics;  Laterality: Right;  Needs RNFA  . NECK SURGERY    . TONSILLECTOMY      Allergies  No Known Allergies  History of Present Illness    Mr. Patrick Collier has a PMH of cardiac murmur, OA right knee, intracerebral hemorrhage, and complications of anesthesia.  He was last seen in the clinic on 04/18/2017 for an evaluation prior to his knee replacement.  This was limiting his mobility.  Prior to his knee pain he was regularly walking 4 miles per day with no symptoms.  During his visit with Dr. Stanford Breed he denied dyspnea on exertion, orthopnea, lower extremity edema, palpitations, chest pain, and syncope.  He was given clearance for his upcoming procedure.  His echocardiogram 12/18 showed an ejection fraction of 55 to 60%, G1 DD, and mild aortic stenosis.  He presents to the clinic today for follow-up evaluation states he feels well.  He continues to be very physically active.  He tolerated his knee surgery well.  He is now walking 4 miles 3-4 times per week.  On the days that he does not walk he swims or does water walking.  He is following a low-sodium diet.  He reports that his blood pressure has been well controlled in the 120s-130s over 70s.   We reviewed his previous echocardiogram and his aortic root dilation/aortic stenosis.  I will order a repeat/follow-up echocardiogram and have him return for follow-up in 12 months.  Today he denies chest pain, shortness of breath, lower extremity edema, fatigue, palpitations, melena, hematuria, hemoptysis, diaphoresis, weakness, presyncope, syncope, orthopnea, and PND.   Home Medications    Prior to Admission medications   Medication Sig Start Date End Date Taking? Authorizing Provider  acetaminophen (TYLENOL 8 HOUR) 650 MG CR tablet Take 500 mg by mouth every 8 (eight) hours as needed for pain.     [provider]  aspirin 81 MG chewable tablet Chew 1 tablet (81 mg total) by mouth 2 (two) times daily. 05/17/17   Swinteck, Aaron Edelman, MD  docusate sodium (COLACE) 100 MG capsule Take 1 capsule (100 mg total) by mouth 2 (two) times daily. 05/17/17   Swinteck, Aaron Edelman, MD  HYDROcodone-acetaminophen (NORCO/VICODIN) 5-325 MG tablet Take 1 tablet by mouth every 4 (four) hours as needed. 05/17/17   Swinteck, Aaron Edelman, MD  naproxen (NAPROSYN) 500 MG tablet Take 500 mg by mouth 2 (two) times daily. 04/02/17   [provider]  omeprazole (PRILOSEC) 20 MG capsule Take 20 mg by mouth 2 (two) times daily. 03/11/17   [provider]  ondansetron (ZOFRAN) 4 MG tablet Take 1 tablet (4 mg total) by mouth every 6 (six) hours as needed for nausea. 05/17/17  Swinteck, Aaron Edelman, MD  senna (SENOKOT) 8.6 MG TABS tablet Take 2 tablets (17.2 mg total) by mouth at bedtime. 05/17/17   Rod Can, MD    Family History    Family History  Problem Relation Age of Onset  . Diabetes Mother    He indicated that his mother is deceased. He indicated that his father is deceased.  Social History    Social History   Socioeconomic History  . Marital status: Married    Spouse name: Not on file  . Number of children: 3  . Years of education: Not on file  . Highest education level: Not on file   Occupational History    Comment: Retired  Tobacco Use  . Smoking status: Former Research scientist (life sciences)  . Smokeless tobacco: Never Used  Vaping Use  . Vaping Use: Never used  Substance and Sexual Activity  . Alcohol use: Yes    Comment: one beer per day  . Drug use: No  . Sexual activity: Not on file  Other Topics Concern  . Not on file  Social History Narrative  . Not on file   Social Determinants of Health   Financial Resource Strain: Not on file  Food Insecurity: Not on file  Transportation Needs: Not on file  Physical Activity: Not on file  Stress: Not on file  Social Connections: Not on file  Intimate Partner Violence: Not on file     Review of Systems    General:  No chills, fever, night sweats or weight changes.  Cardiovascular:  No chest pain, dyspnea on exertion, edema, orthopnea, palpitations, paroxysmal nocturnal dyspnea. Dermatological: No rash, lesions/masses Respiratory: No cough, dyspnea Urologic: No hematuria, dysuria Abdominal:   No nausea, vomiting, diarrhea, bright red blood per rectum, melena, or hematemesis Neurologic:  No visual changes, wkns, changes in mental status. All other systems reviewed and are otherwise negative except as noted above.  Physical Exam    VS:  BP 130/78   Pulse (!) 51   Ht 5\' 7"  (1.702 m)   Wt 187 lb (84.8 kg)   BMI 29.29 kg/m  , BMI Body mass index is 29.29 kg/m. GEN: Well nourished, well developed, in no acute distress. HEENT: normal. Neck: Supple, no JVD, carotid bruits, or masses. Cardiac: RRR, no murmurs, rubs, or gallops. No clubbing, cyanosis, edema.  Radials/DP/PT 2+ and equal bilaterally.  Respiratory:  Respirations regular and unlabored, clear to auscultation bilaterally. GI: Soft, nontender, nondistended, BS + x 4. MS: no deformity or atrophy. Skin: warm and dry, no rash. Neuro:  Strength and sensation are intact. Psych: Normal affect.  Accessory Clinical Findings    Recent Labs: No results found for requested  labs within last 8760 hours.   Recent Lipid Panel No results found for: CHOL, TRIG, HDL, CHOLHDL, VLDL, LDLCALC, LDLDIRECT  ECG personally reviewed by me today-sinus bradycardia no ST or T wave deviation 51 bpm- No acute changes  Echocardiogram 04/19/2017 Study Conclusions   - Left ventricle: The cavity size was normal. Wall thickness was  increased in a pattern of mild LVH. Systolic function was normal.  The estimated ejection fraction was in the range of 55% to 60%.  Wall motion was normal; there were no regional wall motion  abnormalities. Doppler parameters are consistent with abnormal  left ventricular relaxation (grade 1 diastolic dysfunction). The  E/e&' ratio is between 8-15, suggesting indeterminate LV filling  pressure. LVOT diameter (S): 25.9 mm.  - Aortic valve: Trileaflet; mildly calcified leaflets. Mild  stenosis. Mean gradient (  S): 17 mm Hg. Peak gradient (S): 30 mm  Hg. Valve area (VTI): 1.76 cm^2. Valve area (Vmax): 1.82 cm^2.  Valve area (Vmean): 1.78 cm^2.  - Aorta: Mildly dilated aortic root. Aortic root dimension: 39 mm  (ED).  - Mitral valve: Calcified annulus. Mildly thickened leaflets .  There was mild regurgitation.  - Left atrium: Moderately dilated.  - Right atrium: The atrium was mildly dilated.  - Tricuspid valve: There was mild regurgitation.  - Pulmonary arteries: PA peak pressure: 31 mm Hg (S).  - Inferior vena cava: The vessel was normal in size. The  respirophasic diameter changes were in the normal range (= 50%),  consistent with normal central venous pressure.   Impressions:   - LVEF 55-60%, mild LVH, normal wall motion, grade 1 DD,  indeterminate LV fililng pressure, mild aortic stenosis, mildly  dilated aortic root at 3.9 cm, mild MR, moderate LAE, mild RAE,  mild TR, RVSP 31 mmHg, normal IVC.  Assessment & Plan   1.  Cardiac murmur/aortic stenosis -no increased DOE or activity intolerance.  Underwent  echocardiogram 12/18 which showed normal LVEF, G1 DD, mild MR, mild aortic root dilation 39 mm, and mild aortic stenosis. Heart healthy low-sodium diet-salty 6 given Increase physical activity as tolerated Repeat echocardiogram   Disposition: Follow-up with Dr. Stanford Breed or me in 12 months.  Jossie Ng. Sylvan Sookdeo NP-C    04/06/2020, 2:57 PM [facility] [place] [address] Office [phone] Fax [phone]  Notice: This dictation was prepared with Dragon dictation along with smaller phrase technology. Any transcriptional errors that result from this process are unintentional and may not be corrected upon review.

## 2020-04-06 ENCOUNTER — Ambulatory Visit (INDEPENDENT_AMBULATORY_CARE_PROVIDER_SITE_OTHER): Payer: Medicare Other | Admitting: General Practice

## 2020-04-06 ENCOUNTER — Other Ambulatory Visit: Payer: Self-pay

## 2020-04-06 ENCOUNTER — Encounter: Payer: Self-pay | Admitting: General Practice

## 2020-04-06 VITALS — BP 130/78 | HR 51 | Ht 67.0 in | Wt 187.0 lb

## 2020-04-06 DIAGNOSIS — I350 Nonrheumatic aortic (valve) stenosis: Secondary | ICD-10-CM

## 2020-04-06 DIAGNOSIS — I77810 Thoracic aortic ectasia: Secondary | ICD-10-CM | POA: Diagnosis not present

## 2020-04-06 DIAGNOSIS — R011 Cardiac murmur, unspecified: Secondary | ICD-10-CM

## 2020-04-06 NOTE — Patient Instructions (Addendum)
Medication Instructions:  The current medical regimen is effective;  continue present plan and medications as directed. Please refer to the Current Medication list given to you today.  *If you need a refill on your cardiac medications before your next appointment, please call your pharmacy*  Lab Work:     NONE  Testing/Procedures: Echocardiogram - Your physician has requested that you have an echocardiogram. Echocardiography is a painless test that uses sound waves to create images of your heart. It provides your doctor with information about the size and shape of your heart and how well your heart's chambers and valves are working. This procedure takes approximately one hour. There are no restrictions for this procedure. This will be performed at our [facility] location - [address].  Follow-Up: Your next appointment:  12 month(s) In Person with You may see Kirk Ruths, MD  OR IF UNAVAILABLE JESSE CLEAVER, FNP-C or one of the following Advanced Practice Providers on your designated Care Team:  Kerin Ransom, [place]  Sande Rives, [place]   Please call our office 2 months in advance to schedule this appointment   At [facility], you and your health needs are our priority.  As part of our continuing mission to provide you with exceptional heart care, we have created designated Provider Care Teams.  These Care Teams include your primary Cardiologist (physician) and Advanced Practice Providers (APPs -  Physician Assistants and Nurse Practitioners) who all work together to provide you with the care you need, when you need it.

## 2020-04-11 ENCOUNTER — Other Ambulatory Visit: Payer: Self-pay | Admitting: Family Medicine

## 2020-04-25 DIAGNOSIS — R7989 Other specified abnormal findings of blood chemistry: Secondary | ICD-10-CM | POA: Diagnosis not present

## 2020-04-25 DIAGNOSIS — R945 Abnormal results of liver function studies: Secondary | ICD-10-CM | POA: Diagnosis not present

## 2020-04-25 DIAGNOSIS — R7401 Elevation of levels of liver transaminase levels: Secondary | ICD-10-CM | POA: Diagnosis not present

## 2020-05-02 ENCOUNTER — Other Ambulatory Visit: Payer: Self-pay | Admitting: Family Medicine

## 2020-05-02 DIAGNOSIS — R7989 Other specified abnormal findings of blood chemistry: Secondary | ICD-10-CM

## 2020-05-13 ENCOUNTER — Other Ambulatory Visit: Payer: Self-pay

## 2020-05-13 ENCOUNTER — Ambulatory Visit (HOSPITAL_COMMUNITY): Payer: Medicare Other | Attending: Cardiology

## 2020-05-13 DIAGNOSIS — I77810 Thoracic aortic ectasia: Secondary | ICD-10-CM | POA: Diagnosis not present

## 2020-05-13 DIAGNOSIS — R011 Cardiac murmur, unspecified: Secondary | ICD-10-CM | POA: Diagnosis present

## 2020-05-13 DIAGNOSIS — I350 Nonrheumatic aortic (valve) stenosis: Secondary | ICD-10-CM | POA: Diagnosis present

## 2020-05-13 LAB — ECHOCARDIOGRAM COMPLETE
AV Mean grad: 19 mmHg
AV Peak grad: 34 mmHg
Ao pk vel: 2.92 m/s
Area-P 1/2: 2.03 cm2
S' Lateral: 3.1 cm

## 2020-05-16 ENCOUNTER — Other Ambulatory Visit: Payer: Self-pay

## 2020-05-16 ENCOUNTER — Ambulatory Visit
Admission: RE | Admit: 2020-05-16 | Discharge: 2020-05-16 | Disposition: A | Discharge status: Home / Self Care | Payer: Medicare Other | Source: Ambulatory Visit | Attending: Family Medicine | Admitting: Family Medicine

## 2020-05-16 DIAGNOSIS — R7989 Other specified abnormal findings of blood chemistry: Secondary | ICD-10-CM

## 2020-05-16 DIAGNOSIS — K7689 Other specified diseases of liver: Secondary | ICD-10-CM | POA: Diagnosis not present

## 2020-05-16 IMAGING — US US ABDOMEN LIMITED
1 series · 14 of 25 positions shown · non-contrast
Comparison: No pertinent prior exams available for comparison.

CLINICAL DATA: Elevated LFTs.

EXAM:
ULTRASOUND ABDOMEN LIMITED RIGHT UPPER QUADRANT

[Series 1: us abdomen limited · 0.23mm/px · 14 of 49 slices shown]
[im 1/49]
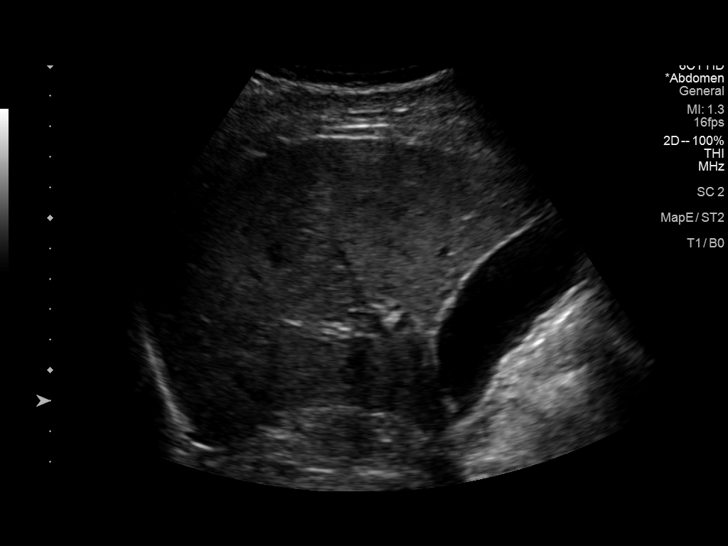
[im 5/49]
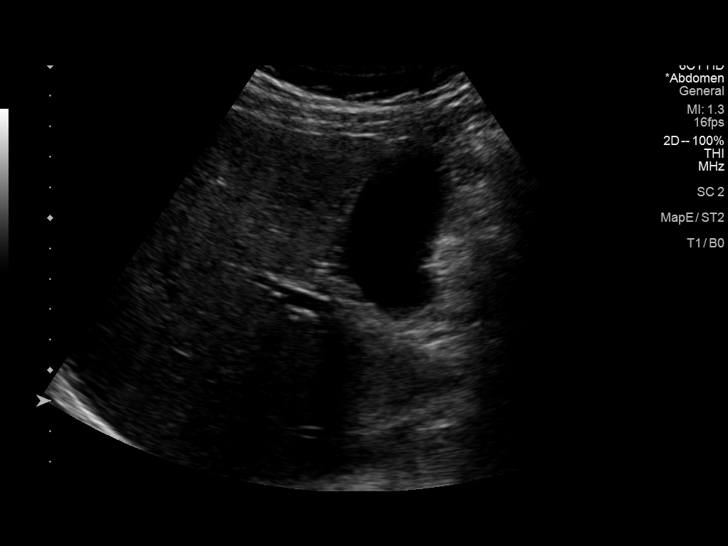
[im 9/49]
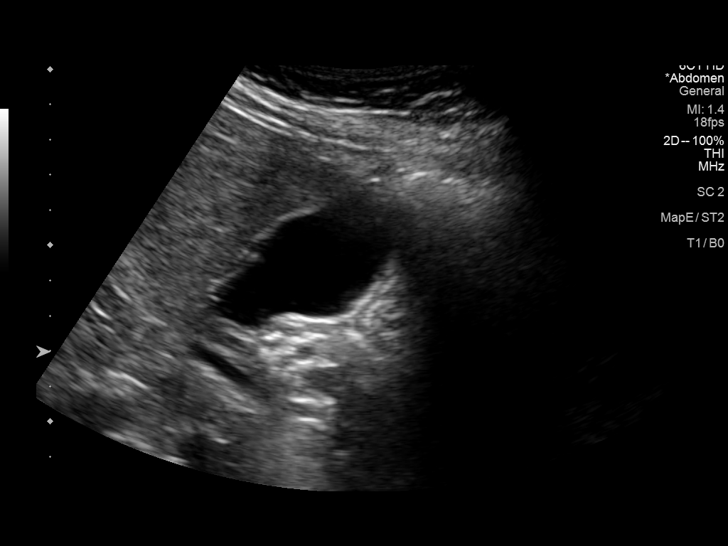
[im 13/49]
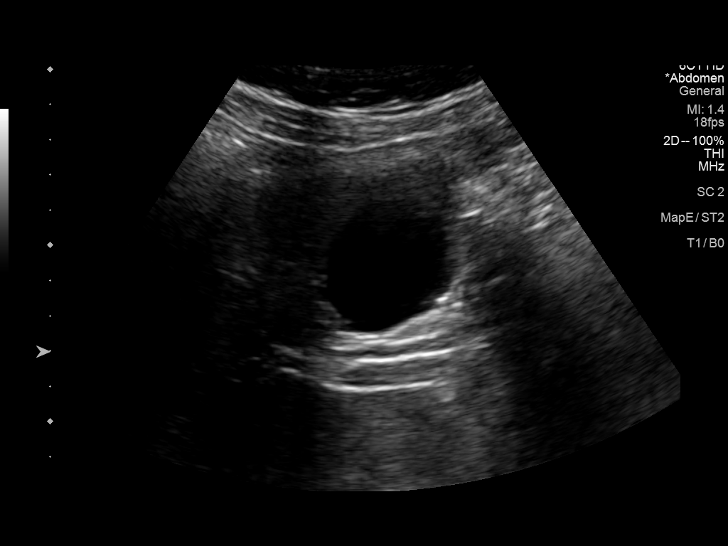
[im 17/49]
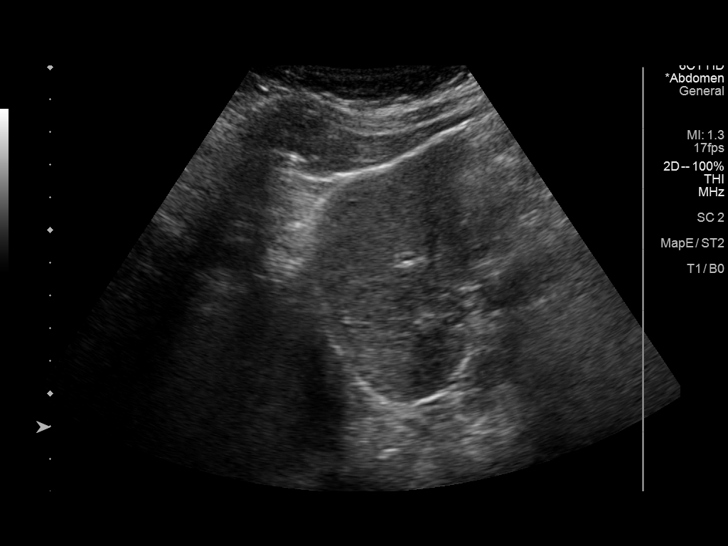
[im 19/49]
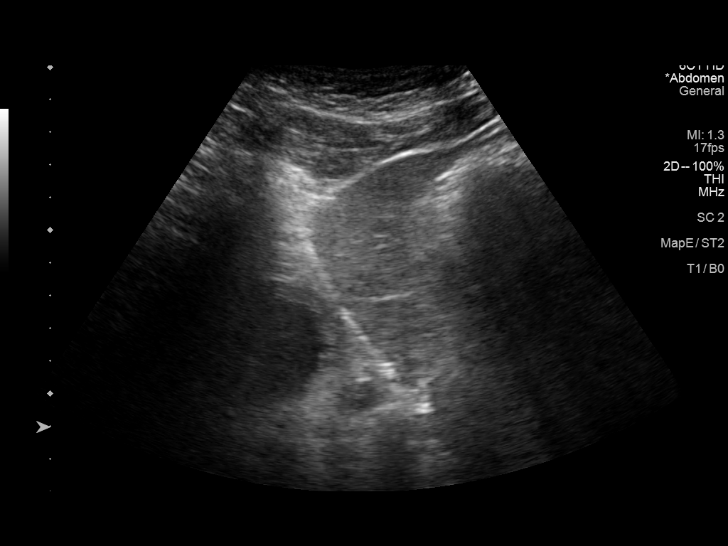
[im 23/49]
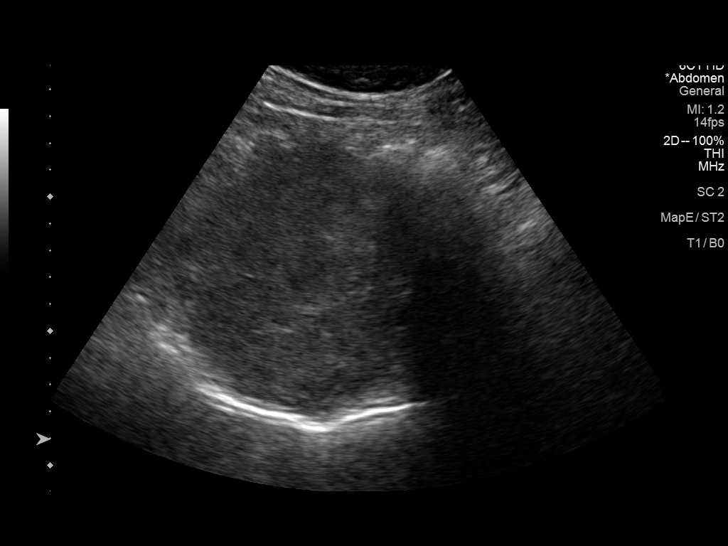
[im 27/49]
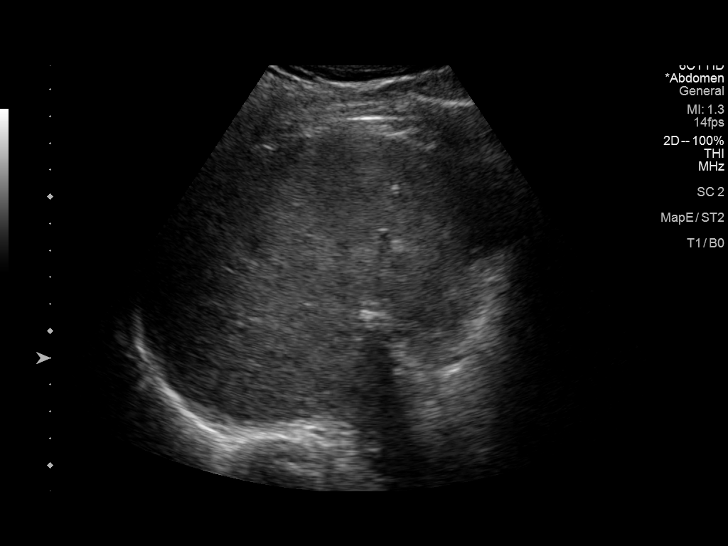
[im 31/49]
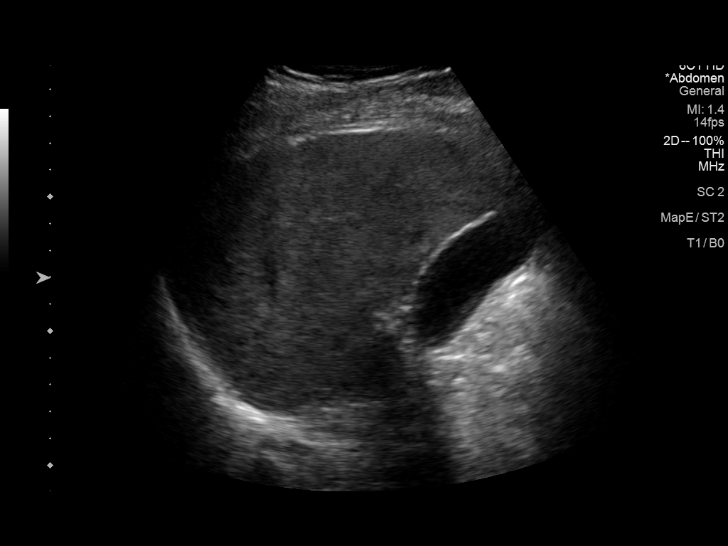
[im 33/49]
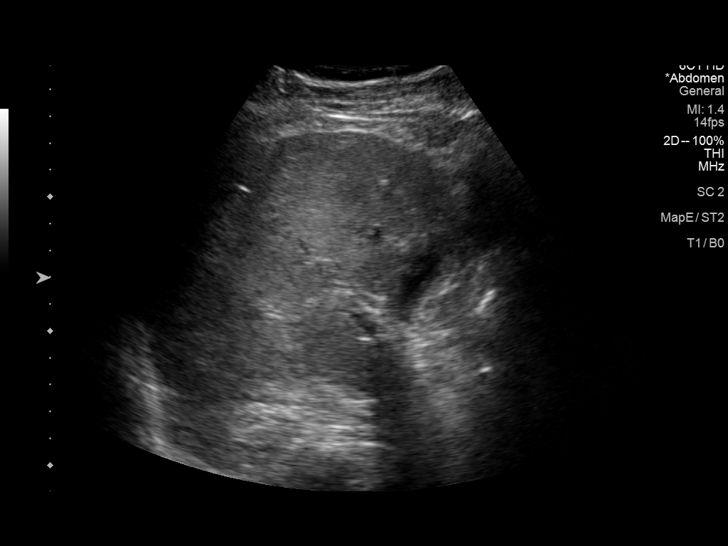
[im 37/49]
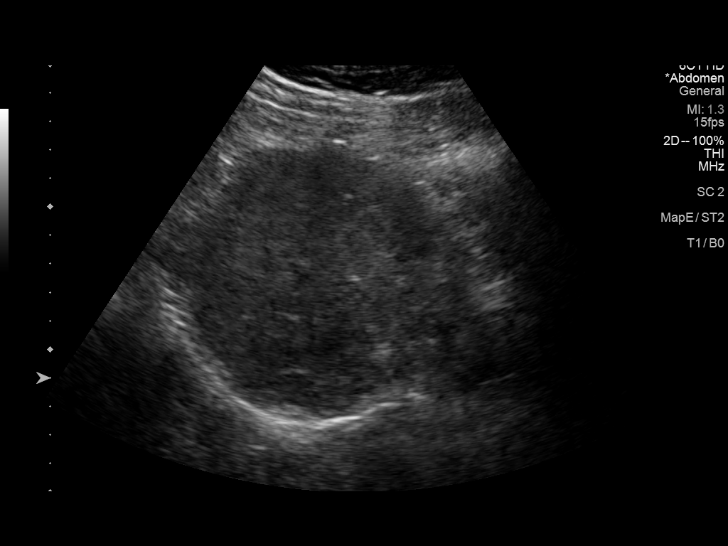
[im 41/49]
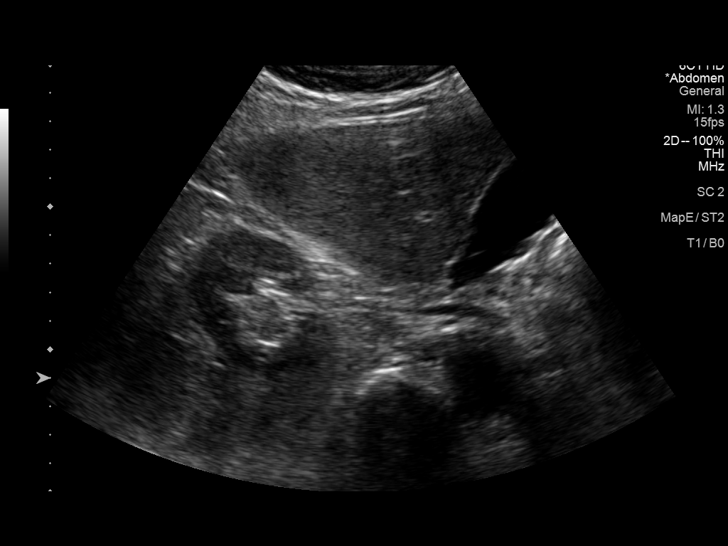
[im 45/49]
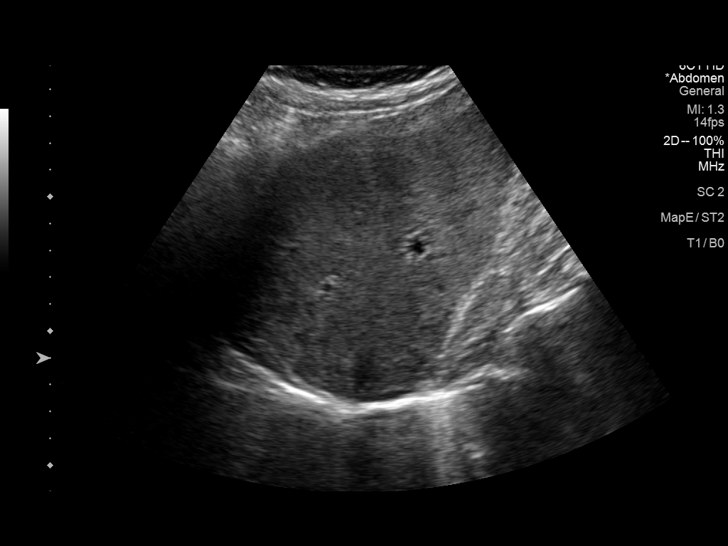
[im 49/49]
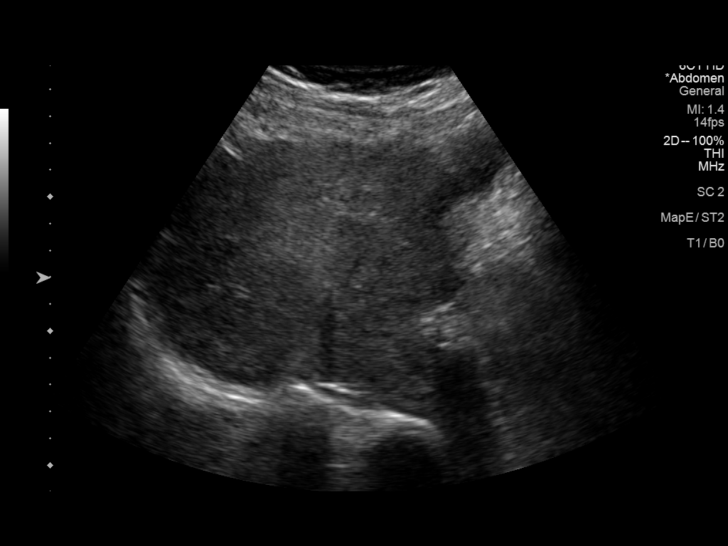

[14 of 25 positions shown; findings below may reference images not displayed]

FINDINGS: Gallbladder:

No gallstones or wall thickening visualized. No sonographic Murphy
sign noted by sonographer.

Common bile duct:

Diameter: 4 mm, within normal limits.

Liver:

No focal lesion identified. Mildly and heterogeneously increased
hepatic parenchymal echogenicity. Portal vein is patent on color
Doppler imaging with normal direction of blood flow towards the
liver.
IMPRESSION: Mildly and heterogeneously increased hepatic parenchymal
echogenicity. This is a nonspecific finding, which may be seen in
the setting of hepatic steatosis or other chronic hepatic
parenchymal disease.

Otherwise unremarkable right upper quadrant ultrasound, as
described.

## 2020-05-24 DIAGNOSIS — R945 Abnormal results of liver function studies: Secondary | ICD-10-CM | POA: Diagnosis not present

## 2020-05-27 DIAGNOSIS — R945 Abnormal results of liver function studies: Secondary | ICD-10-CM | POA: Diagnosis not present

## 2020-05-27 DIAGNOSIS — N289 Disorder of kidney and ureter, unspecified: Secondary | ICD-10-CM | POA: Diagnosis not present

## 2020-05-27 DIAGNOSIS — E785 Hyperlipidemia, unspecified: Secondary | ICD-10-CM | POA: Diagnosis not present

## 2020-06-24 DIAGNOSIS — E785 Hyperlipidemia, unspecified: Secondary | ICD-10-CM | POA: Diagnosis not present

## 2020-07-15 DIAGNOSIS — H5000 Unspecified esotropia: Secondary | ICD-10-CM | POA: Diagnosis not present

## 2020-07-15 DIAGNOSIS — H2513 Age-related nuclear cataract, bilateral: Secondary | ICD-10-CM | POA: Diagnosis not present

## 2020-07-15 DIAGNOSIS — H524 Presbyopia: Secondary | ICD-10-CM | POA: Diagnosis not present

## 2020-08-09 DIAGNOSIS — R42 Dizziness and giddiness: Secondary | ICD-10-CM | POA: Diagnosis not present

## 2020-08-09 DIAGNOSIS — R030 Elevated blood-pressure reading, without diagnosis of hypertension: Secondary | ICD-10-CM | POA: Diagnosis not present

## 2020-08-09 DIAGNOSIS — R2689 Other abnormalities of gait and mobility: Secondary | ICD-10-CM | POA: Diagnosis not present

## 2020-09-23 DIAGNOSIS — R945 Abnormal results of liver function studies: Secondary | ICD-10-CM | POA: Diagnosis not present

## 2020-11-24 DIAGNOSIS — M654 Radial styloid tenosynovitis [de Quervain]: Secondary | ICD-10-CM | POA: Diagnosis not present

## 2020-11-24 DIAGNOSIS — M65352 Trigger finger, left little finger: Secondary | ICD-10-CM | POA: Diagnosis not present

## 2020-11-24 DIAGNOSIS — M79641 Pain in right hand: Secondary | ICD-10-CM | POA: Diagnosis not present

## 2020-11-24 DIAGNOSIS — M79642 Pain in left hand: Secondary | ICD-10-CM | POA: Diagnosis not present

## 2020-12-29 DIAGNOSIS — M65352 Trigger finger, left little finger: Secondary | ICD-10-CM | POA: Diagnosis not present

## 2020-12-29 DIAGNOSIS — M79642 Pain in left hand: Secondary | ICD-10-CM | POA: Diagnosis not present

## 2021-01-26 DIAGNOSIS — Z23 Encounter for immunization: Secondary | ICD-10-CM | POA: Diagnosis not present

## 2021-01-30 DIAGNOSIS — L821 Other seborrheic keratosis: Secondary | ICD-10-CM | POA: Diagnosis not present

## 2021-01-30 DIAGNOSIS — D1801 Hemangioma of skin and subcutaneous tissue: Secondary | ICD-10-CM | POA: Diagnosis not present

## 2021-01-30 DIAGNOSIS — L570 Actinic keratosis: Secondary | ICD-10-CM | POA: Diagnosis not present

## 2021-05-12 ENCOUNTER — Other Ambulatory Visit: Payer: Self-pay

## 2021-05-12 ENCOUNTER — Ambulatory Visit (HOSPITAL_COMMUNITY): Payer: Medicare Other | Attending: General Practice

## 2021-05-12 DIAGNOSIS — R011 Cardiac murmur, unspecified: Secondary | ICD-10-CM

## 2021-05-12 DIAGNOSIS — I350 Nonrheumatic aortic (valve) stenosis: Secondary | ICD-10-CM | POA: Diagnosis not present

## 2021-05-12 DIAGNOSIS — I77810 Thoracic aortic ectasia: Secondary | ICD-10-CM | POA: Diagnosis not present

## 2021-05-12 LAB — ECHOCARDIOGRAM COMPLETE
AR max vel: 1.65 cm2
AV Area VTI: 1.72 cm2
AV Area mean vel: 1.61 cm2
AV Mean grad: 21 mmHg
AV Peak grad: 40.7 mmHg
Ao pk vel: 3.19 m/s
Area-P 1/2: 2.14 cm2
S' Lateral: 2.3 cm

## 2021-05-16 ENCOUNTER — Other Ambulatory Visit: Payer: Self-pay

## 2021-05-16 DIAGNOSIS — I350 Nonrheumatic aortic (valve) stenosis: Secondary | ICD-10-CM

## 2021-05-16 DIAGNOSIS — I77810 Thoracic aortic ectasia: Secondary | ICD-10-CM

## 2021-05-16 DIAGNOSIS — R011 Cardiac murmur, unspecified: Secondary | ICD-10-CM

## 2021-05-17 DIAGNOSIS — Z0000 Encounter for general adult medical examination without abnormal findings: Secondary | ICD-10-CM | POA: Diagnosis not present

## 2021-05-17 DIAGNOSIS — R7309 Other abnormal glucose: Secondary | ICD-10-CM | POA: Diagnosis not present

## 2021-05-17 DIAGNOSIS — D7589 Other specified diseases of blood and blood-forming organs: Secondary | ICD-10-CM | POA: Diagnosis not present

## 2021-05-17 DIAGNOSIS — E785 Hyperlipidemia, unspecified: Secondary | ICD-10-CM | POA: Diagnosis not present

## 2021-05-17 DIAGNOSIS — R011 Cardiac murmur, unspecified: Secondary | ICD-10-CM | POA: Diagnosis not present

## 2021-06-20 NOTE — Progress Notes (Signed)
? ? ? ? ?  HPI: Follow-up aortic stenosis.  Abdominal ultrasound January 2019 showed no aneurysm.  Last echocardiogram January 2023 showed ejection fraction 60 to 65%, moderate aortic stenosis with mean gradient 21 mmHg and mildly dilated ascending aorta at 39 mm.  Since last seen there is no dyspnea on exertion, orthopnea, PND, pedal edema, chest pain or syncope. ? ?Current Outpatient Medications  ?Medication Sig Dispense Refill  ? acetaminophen (TYLENOL) 650 MG CR tablet Take 500 mg by mouth every 8 (eight) hours as needed for pain.     ? ?No current facility-administered medications for this visit.  ? ? ? ?Past Medical History:  ?Diagnosis Date  ? Arthritis   ? Back pain   ? Heart murmur   ? History of kidney stones   ? ICH (intracerebral hemorrhage) ([place])   ? ? ?Past Surgical History:  ?Procedure Laterality Date  ? BACK SURGERY    ? INGUINAL HERNIA REPAIR    ? KNEE ARTHROPLASTY Right 05/16/2017  ? Procedure: RIGHT TOTAL KNEE ARTHROPLASTY WITH COMPUTER NAVIGATION;  Surgeon: Rod Can, MD;  Location: [facility];  Service: Orthopedics;  Laterality: Right;  Needs RNFA  ? NECK SURGERY    ? TONSILLECTOMY    ? ? ?Social History  ? ?Socioeconomic History  ? Marital status: Married  ?  Spouse name: Not on file  ? Number of children: 3  ? Years of education: Not on file  ? Highest education level: Not on file  ?Occupational History  ?  Comment: Retired  ?Tobacco Use  ? Smoking status: Former  ? Smokeless tobacco: Never  ?Vaping Use  ? Vaping Use: Never used  ?Substance and Sexual Activity  ? Alcohol use: Yes  ?  Comment: one beer per day  ? Drug use: No  ? Sexual activity: Not on file  ?Other Topics Concern  ? Not on file  ?Social History Narrative  ? Not on file  ? ?Social Determinants of Health  ? ?Financial Resource Strain: Not on file  ?Food Insecurity: Not on file  ?Transportation Needs: Not on file  ?Physical Activity: Not on file  ?Stress: Not on file  ?Social Connections: Not on file  ?Intimate Partner Violence:  Not on file  ? ? ?Family History  ?Problem Relation Age of Onset  ? Diabetes Mother   ? ? ?ROS: no fevers or chills, productive cough, hemoptysis, dysphasia, odynophagia, melena, hematochezia, dysuria, hematuria, rash, seizure activity, orthopnea, PND, pedal edema, claudication. Remaining systems are negative. ? ?Physical Exam: ?Well-developed well-nourished in no acute distress.  ?Skin is warm and dry.  ?HEENT is normal.  ?Neck is supple.  ?Chest is clear to auscultation with normal expansion.  ?Cardiovascular exam is regular rate and rhythm.  3/6 systolic murmur left sternal border.  S2 is not diminished. ?Abdominal exam nontender or distended. No masses palpated. ?Extremities show no edema. ?neuro grossly intact ? ?ECG- Sinus bradycardia; nonspecific ST changes; personally reviewed ? ?A/P ? ?1 aortic stenosis-moderate on most recent echocardiogram.  Patient denies dyspnea, chest pain or syncope.  He understands these of the symptoms to be aware of and will likely require aortic valve replacement in the future.  I will see him back in 6 months to make sure that his symptoms are stable. ? ?Kirk Ruths, MD ? ? ? ?

## 2021-06-22 ENCOUNTER — Ambulatory Visit (INDEPENDENT_AMBULATORY_CARE_PROVIDER_SITE_OTHER): Payer: Medicare Other | Admitting: Cardiology

## 2021-06-22 ENCOUNTER — Other Ambulatory Visit: Payer: Self-pay

## 2021-06-22 ENCOUNTER — Encounter: Payer: Self-pay | Admitting: Cardiology

## 2021-06-22 VITALS — BP 118/70 | HR 50 | Ht 67.0 in | Wt 187.0 lb

## 2021-06-22 DIAGNOSIS — I350 Nonrheumatic aortic (valve) stenosis: Secondary | ICD-10-CM | POA: Diagnosis not present

## 2021-06-22 NOTE — Patient Instructions (Signed)

## 2021-07-17 DIAGNOSIS — H524 Presbyopia: Secondary | ICD-10-CM | POA: Diagnosis not present

## 2021-07-17 DIAGNOSIS — H2513 Age-related nuclear cataract, bilateral: Secondary | ICD-10-CM | POA: Diagnosis not present

## 2021-10-04 DIAGNOSIS — L819 Disorder of pigmentation, unspecified: Secondary | ICD-10-CM | POA: Diagnosis not present

## 2021-10-04 DIAGNOSIS — R634 Abnormal weight loss: Secondary | ICD-10-CM | POA: Diagnosis not present

## 2021-10-04 DIAGNOSIS — R82998 Other abnormal findings in urine: Secondary | ICD-10-CM | POA: Diagnosis not present

## 2021-10-04 DIAGNOSIS — R945 Abnormal results of liver function studies: Secondary | ICD-10-CM | POA: Diagnosis not present

## 2021-10-06 ENCOUNTER — Inpatient Hospital Stay (HOSPITAL_COMMUNITY)
Admission: EM | Admit: 2021-10-06 | Discharge: 2021-10-12 | DRG: 435 | Disposition: A | Discharge status: Home / Self Care | Payer: Medicare Other | Attending: Student | Admitting: Student

## 2021-10-06 ENCOUNTER — Encounter (HOSPITAL_COMMUNITY): Payer: Self-pay

## 2021-10-06 ENCOUNTER — Other Ambulatory Visit: Payer: Self-pay

## 2021-10-06 DIAGNOSIS — G8929 Other chronic pain: Secondary | ICD-10-CM | POA: Diagnosis present

## 2021-10-06 DIAGNOSIS — K831 Obstruction of bile duct: Secondary | ICD-10-CM | POA: Diagnosis not present

## 2021-10-06 DIAGNOSIS — R21 Rash and other nonspecific skin eruption: Secondary | ICD-10-CM | POA: Diagnosis present

## 2021-10-06 DIAGNOSIS — R17 Unspecified jaundice: Secondary | ICD-10-CM | POA: Diagnosis not present

## 2021-10-06 DIAGNOSIS — E876 Hypokalemia: Secondary | ICD-10-CM | POA: Diagnosis not present

## 2021-10-06 DIAGNOSIS — C221 Intrahepatic bile duct carcinoma: Principal | ICD-10-CM | POA: Diagnosis present

## 2021-10-06 DIAGNOSIS — R739 Hyperglycemia, unspecified: Secondary | ICD-10-CM | POA: Diagnosis present

## 2021-10-06 DIAGNOSIS — M549 Dorsalgia, unspecified: Secondary | ICD-10-CM | POA: Diagnosis not present

## 2021-10-06 DIAGNOSIS — M1711 Unilateral primary osteoarthritis, right knee: Secondary | ICD-10-CM | POA: Diagnosis present

## 2021-10-06 DIAGNOSIS — R7989 Other specified abnormal findings of blood chemistry: Secondary | ICD-10-CM | POA: Diagnosis not present

## 2021-10-06 DIAGNOSIS — Z6826 Body mass index (BMI) 26.0-26.9, adult: Secondary | ICD-10-CM

## 2021-10-06 DIAGNOSIS — D689 Coagulation defect, unspecified: Secondary | ICD-10-CM | POA: Diagnosis present

## 2021-10-06 DIAGNOSIS — I350 Nonrheumatic aortic (valve) stenosis: Secondary | ICD-10-CM

## 2021-10-06 DIAGNOSIS — R001 Bradycardia, unspecified: Secondary | ICD-10-CM | POA: Diagnosis not present

## 2021-10-06 DIAGNOSIS — R188 Other ascites: Secondary | ICD-10-CM | POA: Diagnosis present

## 2021-10-06 DIAGNOSIS — R634 Abnormal weight loss: Secondary | ICD-10-CM | POA: Diagnosis present

## 2021-10-06 DIAGNOSIS — R978 Other abnormal tumor markers: Secondary | ICD-10-CM | POA: Diagnosis present

## 2021-10-06 DIAGNOSIS — Z8673 Personal history of transient ischemic attack (TIA), and cerebral infarction without residual deficits: Secondary | ICD-10-CM | POA: Diagnosis not present

## 2021-10-06 DIAGNOSIS — R935 Abnormal findings on diagnostic imaging of other abdominal regions, including retroperitoneum: Secondary | ICD-10-CM | POA: Diagnosis not present

## 2021-10-06 DIAGNOSIS — K838 Other specified diseases of biliary tract: Secondary | ICD-10-CM | POA: Diagnosis not present

## 2021-10-06 DIAGNOSIS — I899 Noninfective disorder of lymphatic vessels and lymph nodes, unspecified: Secondary | ICD-10-CM | POA: Diagnosis not present

## 2021-10-06 DIAGNOSIS — R933 Abnormal findings on diagnostic imaging of other parts of digestive tract: Secondary | ICD-10-CM | POA: Diagnosis not present

## 2021-10-06 DIAGNOSIS — R945 Abnormal results of liver function studies: Secondary | ICD-10-CM | POA: Diagnosis not present

## 2021-10-06 DIAGNOSIS — K862 Cyst of pancreas: Secondary | ICD-10-CM | POA: Diagnosis not present

## 2021-10-06 DIAGNOSIS — Z01810 Encounter for preprocedural cardiovascular examination: Secondary | ICD-10-CM | POA: Diagnosis not present

## 2021-10-06 DIAGNOSIS — Z87891 Personal history of nicotine dependence: Secondary | ICD-10-CM

## 2021-10-06 DIAGNOSIS — K8309 Other cholangitis: Secondary | ICD-10-CM | POA: Diagnosis not present

## 2021-10-06 DIAGNOSIS — E7220 Disorder of urea cycle metabolism, unspecified: Secondary | ICD-10-CM | POA: Diagnosis present

## 2021-10-06 DIAGNOSIS — M1990 Unspecified osteoarthritis, unspecified site: Secondary | ICD-10-CM | POA: Diagnosis not present

## 2021-10-06 DIAGNOSIS — R011 Cardiac murmur, unspecified: Secondary | ICD-10-CM | POA: Diagnosis not present

## 2021-10-06 DIAGNOSIS — J90 Pleural effusion, not elsewhere classified: Secondary | ICD-10-CM | POA: Diagnosis not present

## 2021-10-06 DIAGNOSIS — R896 Abnormal cytological findings in specimens from other organs, systems and tissues: Secondary | ICD-10-CM | POA: Diagnosis not present

## 2021-10-06 LAB — CBC WITH DIFFERENTIAL/PLATELET
Abs Immature Granulocytes: 0.03 10*3/uL (ref 0.00–0.07)
Basophils Absolute: 0 10*3/uL (ref 0.0–0.1)
Basophils Relative: 0 %
Eosinophils Absolute: 0.1 10*3/uL (ref 0.0–0.5)
Eosinophils Relative: 1 %
HCT: 41.1 % (ref 39.0–52.0)
Hemoglobin: 13.8 g/dL (ref 13.0–17.0)
Immature Granulocytes: 0 %
Lymphocytes Relative: 7 %
Lymphs Abs: 0.5 10*3/uL — ABNORMAL LOW (ref 0.7–4.0)
MCH: 33.1 pg (ref 26.0–34.0)
MCHC: 33.6 g/dL (ref 30.0–36.0)
MCV: 98.6 fL (ref 80.0–100.0)
Monocytes Absolute: 1 10*3/uL (ref 0.1–1.0)
Monocytes Relative: 12 %
Neutro Abs: 6.2 10*3/uL (ref 1.7–7.7)
Neutrophils Relative %: 80 %
Platelets: 229 10*3/uL (ref 150–400)
RBC: 4.17 MIL/uL — ABNORMAL LOW (ref 4.22–5.81)
RDW: 15.4 % (ref 11.5–15.5)
WBC: 7.8 10*3/uL (ref 4.0–10.5)
nRBC: 0 % (ref 0.0–0.2)

## 2021-10-06 LAB — COMPREHENSIVE METABOLIC PANEL
ALT: 159 U/L — ABNORMAL HIGH (ref 0–44)
AST: 232 U/L — ABNORMAL HIGH (ref 15–41)
Albumin: 2.9 g/dL — ABNORMAL LOW (ref 3.5–5.0)
Alkaline Phosphatase: 583 U/L — ABNORMAL HIGH (ref 38–126)
Anion gap: 8 (ref 5–15)
BUN: 14 mg/dL (ref 8–23)
CO2: 29 mmol/L (ref 22–32)
Calcium: 8.8 mg/dL — ABNORMAL LOW (ref 8.9–10.3)
Chloride: 102 mmol/L (ref 98–111)
Creatinine, Ser: 0.96 mg/dL (ref 0.61–1.24)
GFR, Estimated: >60 mL/min (ref >60)
Glucose, Bld: 157 mg/dL — ABNORMAL HIGH (ref 70–99)
Potassium: 3.4 mmol/L — ABNORMAL LOW (ref 3.5–5.1)
Sodium: 139 mmol/L (ref 135–145)
Total Bilirubin: 11.3 mg/dL — ABNORMAL HIGH (ref 0.3–1.2)
Total Protein: 7.3 g/dL (ref 6.5–8.1)

## 2021-10-06 LAB — URINALYSIS, ROUTINE W REFLEX MICROSCOPIC
Bacteria, UA: NONE SEEN
Glucose, UA: NEGATIVE mg/dL
Ketones, ur: NEGATIVE mg/dL
Leukocytes,Ua: NEGATIVE
Nitrite: NEGATIVE
Protein, ur: 30 mg/dL — AB
Specific Gravity, Urine: >1.046 — ABNORMAL HIGH (ref 1.005–1.030)
pH: 5 (ref 5.0–8.0)

## 2021-10-06 LAB — PROTIME-INR
INR: 1.2 (ref 0.8–1.2)
Prothrombin Time: 15.4 seconds — ABNORMAL HIGH (ref 11.4–15.2)

## 2021-10-06 LAB — LIPASE, BLOOD: Lipase: 38 U/L (ref 11–51)

## 2021-10-06 LAB — AMMONIA: Ammonia: 60 umol/L — ABNORMAL HIGH (ref 9–35)

## 2021-10-06 MED ORDER — ONDANSETRON HCL 4 MG/2ML IJ SOLN
4.0000 mg | Freq: Four times a day (QID) | INTRAMUSCULAR | Status: DC | PRN
  Administered 2021-10-11: 4 mg via INTRAVENOUS

## 2021-10-06 MED ORDER — MORPHINE SULFATE (PF) 2 MG/ML IV SOLN: 2.0000 mg | INTRAVENOUS | Status: DC | PRN

## 2021-10-06 MED ORDER — ENOXAPARIN SODIUM 40 MG/0.4ML IJ SOSY
40.0000 mg | PREFILLED_SYRINGE | INTRAMUSCULAR | Status: DC
  Administered 2021-10-07 – 2021-10-12 (×5): 40 mg via SUBCUTANEOUS
  Filled 2021-10-06 (×6): qty 0.4

## 2021-10-06 MED ORDER — DEXTROSE IN LACTATED RINGERS 5 % IV SOLN: INTRAVENOUS | Status: DC

## 2021-10-06 MED ORDER — ONDANSETRON HCL 4 MG PO TABS: 4.0000 mg | ORAL_TABLET | Freq: Four times a day (QID) | ORAL | Status: DC | PRN

## 2021-10-06 NOTE — ED Provider Notes (Signed)
Patrick Collier DEPT Provider Note   CSN: [id number] Arrival date & time: 10/06/21  1746     History  Chief Complaint  Patient presents with   Jaundice    Patrick Collier is a 81 y.o. male.  HPI  81 year old male presents emergency department with new onset jaundice.  Patient and wife noticed that his skin change about a week ago.  He was seen by his primary doctor, had outpatient labs and CT scan done.  They were called today with abnormal CT results were referred to the ER.  Patient endorses decreased appetite, unintentional weight loss.  But denies any acute abdominal pain/distention, nausea/vomiting/diarrhea.  Home Medications Prior to Admission medications   Medication Sig Start Date End Date Taking? Authorizing Provider  acetaminophen (TYLENOL) 650 MG CR tablet Take 500 mg by mouth every 8 (eight) hours as needed for pain.     [provider]      Allergies    Patient has no known allergies.    Review of Systems   Review of Systems  Constitutional:  Positive for appetite change, fatigue and unexpected weight change. Negative for fever.  Respiratory:  Negative for shortness of breath.   Cardiovascular:  Negative for chest pain.  Gastrointestinal:  Negative for abdominal pain, diarrhea and vomiting.  Skin:  Positive for color change. Negative for rash.  Neurological:  Negative for headaches.    Physical Exam Updated Vital Signs BP 123/82   Pulse (!) 49   Temp 98.3 F (36.8 C) (Oral)   Resp (!) 27   SpO2 97%  Physical Exam Vitals and nursing note reviewed.  Constitutional:      General: He is not in acute distress.    Appearance: Normal appearance.  HENT:     Head: Normocephalic.     Mouth/Throat:     Mouth: Mucous membranes are moist.  Cardiovascular:     Rate and Rhythm: Normal rate.  Pulmonary:     Effort: Pulmonary effort is normal. No respiratory distress.  Abdominal:     General: There is no distension.      Palpations: Abdomen is soft.     Tenderness: There is abdominal tenderness. There is no guarding or rebound.  Skin:    General: Skin is warm.     Coloration: Skin is jaundiced.  Neurological:     Mental Status: He is alert and oriented to person, place, and time. Mental status is at baseline.  Psychiatric:        Mood and Affect: Mood normal.     ED Results / Procedures / Treatments   Labs (all labs ordered are listed, but only abnormal results are displayed) Labs Reviewed  CBC WITH DIFFERENTIAL/PLATELET - Abnormal; Notable for the following components:      Result Value   RBC 4.17 (*)    Lymphs Abs 0.5 (*)    All other components within normal limits  COMPREHENSIVE METABOLIC PANEL - Abnormal; Notable for the following components:   Potassium 3.4 (*)    Glucose, Bld 157 (*)    Calcium 8.8 (*)    Albumin 2.9 (*)    AST 232 (*)    ALT 159 (*)    Alkaline Phosphatase 583 (*)    Total Bilirubin 11.3 (*)    All other components within normal limits  URINALYSIS, ROUTINE W REFLEX MICROSCOPIC - Abnormal; Notable for the following components:   Color, Urine AMBER (*)    Specific Gravity, Urine >1.046 (*)  Hgb urine dipstick SMALL (*)    Bilirubin Urine MODERATE (*)    Protein, ur 30 (*)    All other components within normal limits  PROTIME-INR - Abnormal; Notable for the following components:   Prothrombin Time 15.4 (*)    All other components within normal limits  AMMONIA - Abnormal; Notable for the following components:   Ammonia 60 (*)    All other components within normal limits  LIPASE, BLOOD  CANCER ANTIGEN 19-9    EKG None  Radiology No results found.  Procedures Procedures    Medications Ordered in ED Medications - No data to display  ED Course/ Medical Decision Making/ A&P                           Medical Decision Making Amount and/or Complexity of Data Reviewed Labs: ordered. Radiology: ordered.  Risk Decision regarding  hospitalization.   81 year old male presents emergency department with painless jaundice.  Had reported abnormal outpatient labs/CT scan.  He had a CT scan done today at [facility], we are able to see the verbal read in care everywhere which mentions intra and extrahepatic duct dilation with possible stricture, cholangiocarcinoma or pancreatic head mass.  Patient is jaundiced on exam, vitals otherwise stable.  Blood work shows a transaminitis and elevated bilirubin over 11.  These are new finding to the patient.  Abdomen is not distended.  Consulted with on-call gastroenterologist Dr. Alessandra Bevels.  He recommends MRI/MRCP, CA 19, medical admission, liquid diet.  Patient and family updated, plan for medical admission.  Patients evaluation and results requires admission for further treatment and care.  Spoke with hospitalist Dr. Jonelle Sidle, reviewed patient's ED course and they accept admission.  Patient agrees with admission plan, offers no new complaints and is stable/unchanged at time of admit.        Final Clinical Impression(s) / ED Diagnoses Final diagnoses:  Jaundice    Rx / DC Orders ED Discharge Orders     None         Lorelle Gibbs, DO 10/06/21 2204

## 2021-10-06 NOTE — ED Provider Triage Note (Signed)
Emergency Medicine Provider Triage Evaluation Note  Patrick Collier , a 81 y.o. male  was evaluated in triage.  Pt complains of jaundice. States that same began approximately 1 week ago. Also endorses about 15 lb unintended weight loss recently. Went to his PCP who got labs and a CT scan. Was sent here due to elevated liver enzymes and concerning pancreatic mass found on CT scan.  Upon chart review, CT scan radiates "apparent hyperdense material within the common bile duct or surrounding concerning for obstructing mass lesion such as cholangiocarcinoma with differential including stricture, choledocholithiasis, or less likely a pancreatic head mass.  Recommend follow-up MRCP with and without contrast for further evaluation."  Patient denies any fevers, chills, abdominal pain, nausea, vomiting, diarrhea.  Review of Systems  Positive:  Negative:   Physical Exam  BP 117/69 (BP Location: Left Arm)   Pulse (!) 52   Temp 98 F (36.7 C) (Oral)   Resp 18   SpO2 95%  Gen:   Awake, no distress   Resp:  Normal effort  MSK:   Moves extremities without difficulty  Other:  Patient with scleral icterus and jaundice noted  Medical Decision Making  Medically screening exam initiated at 7:11 PM.  Appropriate orders placed.  Patrick Collier was informed that the remainder of the evaluation will be completed by another provider, this initial triage assessment does not replace that evaluation, and the importance of remaining in the ED until their evaluation is complete.     Patrick Collier 10/06/21 1914

## 2021-10-06 NOTE — ED Triage Notes (Addendum)
Pts wife reports noticing jaundice around last Friday. Pt went to PCP and was sent for CT today. Doctor called pt today to say that he has a mass on his pancreas and needs to go to the ER now. Pt also reports his liver enzymes were elevated. Pt denies N/V/D and abdominal pain.

## 2021-10-06 NOTE — H&P (Signed)
History and Physical    Patient: Patrick Collier WGN:[id number] DOB: [birth year] DOA: 10/06/2021 DOS: the patient was seen and examined on 10/06/2021 PCP: Farris Has, MD  Patient coming from: Home  Chief Complaint:  Chief Complaint  Patient presents with   Jaundice   HPI: Patrick Collier is a 81 y.o. male with medical history significant of osteoarthritis, history of intra-axial hemorrhage, chronic back pain, multiple back surgeries who presented to the ER with jaundice and abdominal pain.  Patient and his wife noted the skin change in color.  This was about a week ago.  He was seen by his PCP.  At that point outpatient labs were all ordered and CT was done.  Patient was called today and told to come to the ER due to abnormal CT results.  He has had unintentional weight loss and decreased appetite.  No significant abdominal pain nausea vomiting or diarrhea over the last few days.  His CT showed a mass around his pancreas.  Based on that patient is being admitted for work-up.  GI has been consulted already.  He is being admitted to the medical service for further evaluation and treatment.  Review of Systems: As mentioned in the history of present illness. All other systems reviewed and are negative. Past Medical History:  Diagnosis Date   Arthritis    Back pain    Heart murmur    History of kidney stones    ICH (intracerebral hemorrhage) ([facility])    Past Surgical History:  Procedure Laterality Date   BACK SURGERY     INGUINAL HERNIA REPAIR     KNEE ARTHROPLASTY Right 05/16/2017   Procedure: RIGHT TOTAL KNEE ARTHROPLASTY WITH COMPUTER NAVIGATION;  Surgeon: Samson Frederic, MD;  Location: [facility];  Service: Orthopedics;  Laterality: Right;  Needs RNFA   NECK SURGERY     TONSILLECTOMY     Social History:  reports that he has quit smoking. He has never used smokeless tobacco. He reports current alcohol use. He reports that he does not use drugs.  No Known Allergies  Family History   Problem Relation Age of Onset   Diabetes Mother     Prior to Admission medications   Medication Sig Start Date End Date Taking? Authorizing Provider  acetaminophen (TYLENOL) 650 MG CR tablet Take 500 mg by mouth every 8 (eight) hours as needed for pain.     [provider]    Physical Exam: Vitals:   10/06/21 2000 10/06/21 2015 10/06/21 2030 10/06/21 2100  BP: 124/71 127/70 124/73 123/82  Pulse: (!) 47 (!) 47 (!) 47 (!) 49  Resp: 15 16 (!) 24 (!) 27  Temp:      TempSrc:      SpO2: 97% 97% 96% 97%   General: Stable, no acute distress HEENT: PERRLA, EOMI, positive jaundice Neck: Supple, no JVD no lymphadenopathy Respiratory: Good air entry bilaterally no wheeze rales or crackles Cardiovascular: Sinus bradycardia Abdomen: Soft, nontender throughout lung sounds Extremities: No edema sinus or clubbing Skin exam: Skin icterus Neuro exam: Awake alert oriented x3, no focal neurologic findings  Data Reviewed:  Potassium 3.4, glucose 157, calcium 8.8, alkaline phosphatase 583 albumin 2.9, lipase of 38 AST 232 ALT 159.  Ammonia is 60 and total bilirubin 11.3.  CBC largely within normal.  Assessment and Plan:   #1 obstructive jaundice: Mass was seen on CT scan at the head of the pancreas.  Patient therefore most likely is having obstructive jaundice probably malignant but other causes  possible.  We will admit the patient.  GI already consulted.  We will get MRCP I.  We will also continue with other test and treatment per GI.   #2 hypokalemia: Probably from nausea.  Replete potassium.  #3 hyperglycemia: Not a known diabetic.  Check A1c and follow blood sugar.  #4 increased LFTs: Secondary to obstructive jaundice.   Advance Care Planning:   Code Status: Prior full code  Consults: Dr. Karlene Einstein, gastroenterology  Family Communication: No family at bedside  Severity of Illness: The appropriate patient status for this patient is INPATIENT. Inpatient status is judged to  be reasonable and necessary in order to provide the required intensity of service to ensure the patient's safety. The patient's presenting symptoms, physical exam findings, and initial radiographic and laboratory data in the context of their chronic comorbidities is felt to place them at high risk for further clinical deterioration. Furthermore, it is not anticipated that the patient will be medically stable for discharge from the hospital within 2 midnights of admission.   * I certify that at the point of admission it is my clinical judgment that the patient will require inpatient hospital care spanning beyond 2 midnights from the point of admission due to high intensity of service, high risk for further deterioration and high frequency of surveillance required.*  AuthorLonia Blood, MD 10/06/2021 10:16 PM  For on call review [email or web address].

## 2021-10-07 ENCOUNTER — Inpatient Hospital Stay (HOSPITAL_COMMUNITY): Payer: Medicare Other

## 2021-10-07 DIAGNOSIS — K831 Obstruction of bile duct: Secondary | ICD-10-CM | POA: Diagnosis not present

## 2021-10-07 LAB — COMPREHENSIVE METABOLIC PANEL
ALT: 135 U/L — ABNORMAL HIGH (ref 0–44)
AST: 188 U/L — ABNORMAL HIGH (ref 15–41)
Albumin: 2.5 g/dL — ABNORMAL LOW (ref 3.5–5.0)
Alkaline Phosphatase: 508 U/L — ABNORMAL HIGH (ref 38–126)
Anion gap: 6 (ref 5–15)
BUN: 12 mg/dL (ref 8–23)
CO2: 27 mmol/L (ref 22–32)
Calcium: 8.3 mg/dL — ABNORMAL LOW (ref 8.9–10.3)
Chloride: 103 mmol/L (ref 98–111)
Creatinine, Ser: 0.87 mg/dL (ref 0.61–1.24)
GFR, Estimated: >60 mL/min (ref >60)
Glucose, Bld: 136 mg/dL — ABNORMAL HIGH (ref 70–99)
Potassium: 3.1 mmol/L — ABNORMAL LOW (ref 3.5–5.1)
Sodium: 136 mmol/L (ref 135–145)
Total Bilirubin: 9.9 mg/dL — ABNORMAL HIGH (ref 0.3–1.2)
Total Protein: 6.5 g/dL (ref 6.5–8.1)

## 2021-10-07 LAB — CBC
HCT: 36.1 % — ABNORMAL LOW (ref 39.0–52.0)
Hemoglobin: 12.5 g/dL — ABNORMAL LOW (ref 13.0–17.0)
MCH: 33.2 pg (ref 26.0–34.0)
MCHC: 34.6 g/dL (ref 30.0–36.0)
MCV: 96 fL (ref 80.0–100.0)
Platelets: 198 10*3/uL (ref 150–400)
RBC: 3.76 MIL/uL — ABNORMAL LOW (ref 4.22–5.81)
RDW: 15.5 % (ref 11.5–15.5)
WBC: 7.7 10*3/uL (ref 4.0–10.5)
nRBC: 0 % (ref 0.0–0.2)

## 2021-10-07 IMAGING — MR MR 3D RECON AT SCANNER
18 of 21 series · 41 of 48 positions shown · IV contrast (gadavist)
Comparison: None Available.

CLINICAL DATA: Biliary obstruction suspected.

EXAM:
MRI ABDOMEN WITHOUT AND WITH CONTRAST (INCLUDING MRCP)
TECHNIQUE: Multiplanar multisequence MR imaging of the abdomen was performed
both before and after the administration of intravenous contrast.
Heavily T2-weighted images of the biliary and pancreatic ducts were
obtained, and three-dimensional MRCP images were rendered by post
processing.
CONTRAST:  8mL GADAVIST GADOBUTROL 1 MMOL/ML IV SOLN

[Series 3: T2 fat-sat · axial · 6.0mm · 1.25mm/px · 1 of 45 slices shown]
[im 1/45]
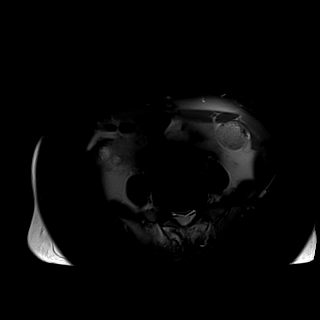

[Series 5: DWI · axial · 6.0mm · 1.49mm/px · z∈[-133,+198]mm · 3 of 94 slices shown (1 of 2)]
[im 1/94]
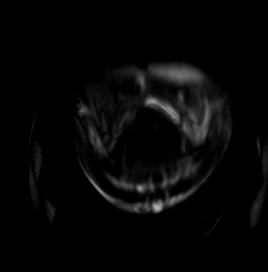
[im 47/94]
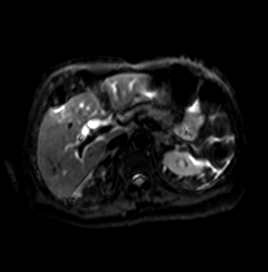
[im 94/94]
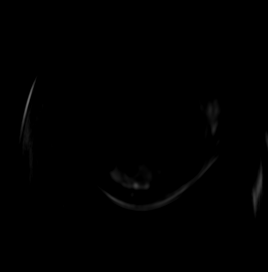

[Series 6: DWI · axial · 6.0mm · 1.49mm/px · 1 of 47 slices shown (2 of 2)]
[im 1/47]
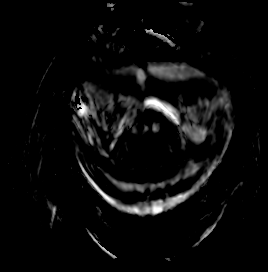

[Series 8: cor_3d_spc_trig · coronal · 1.0mm · 0.49mm/px · 2 of 72 slices shown]
[im 1/72]
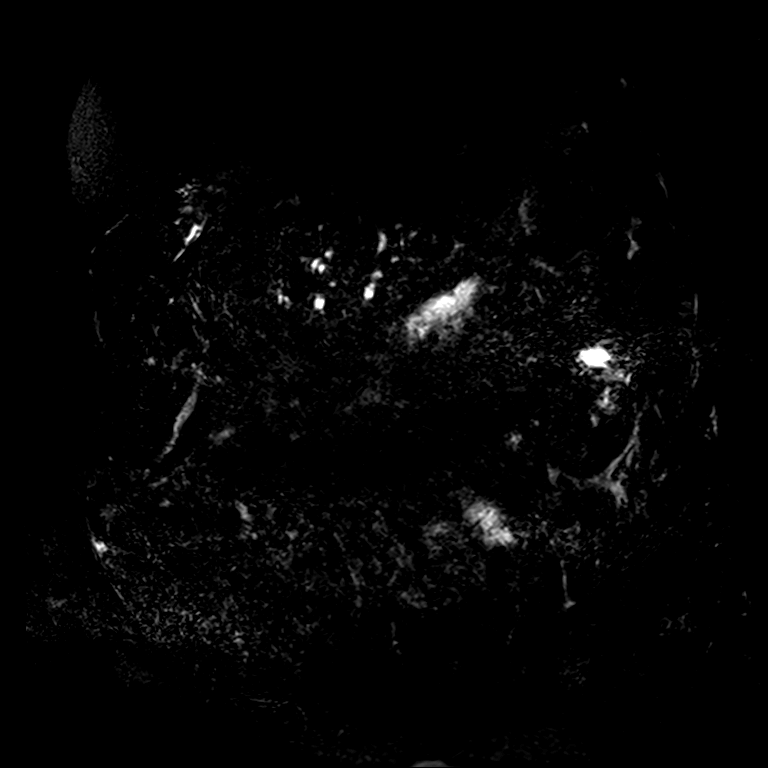
[im 72/72]
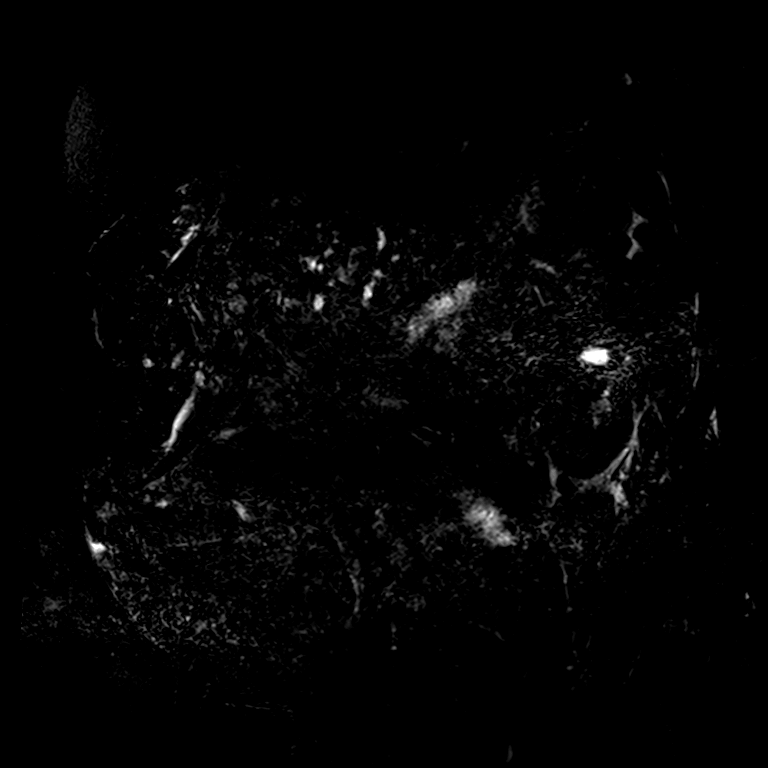

[Series 13: T2 · coronal · 6.0mm · 1.48mm/px · 1 of 39 slices shown (1 of 2)]
[im 1/39]
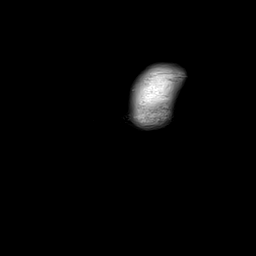

[Series 14: T1 · axial · 3.5mm · 1.25mm/px · z∈[-119,+186]mm · 3 of 88 slices shown (1 of 2)]
[im 1/88]
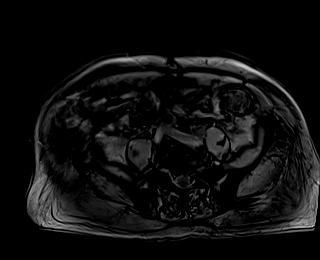
[im 44/88]
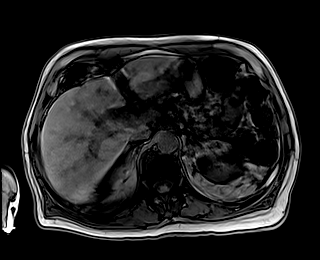
[im 88/88]
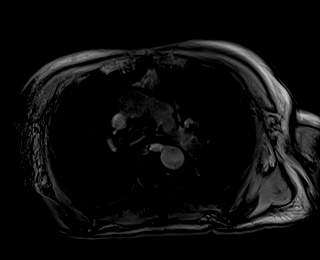

[Series 15: T1 · axial · 3.5mm · 1.25mm/px · z∈[-119,+186]mm · 3 of 88 slices shown (2 of 2)]
[im 1/88]
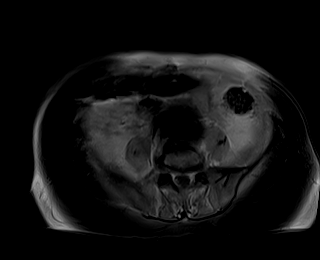
[im 44/88]
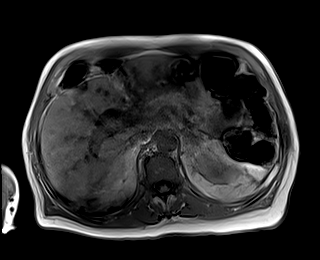
[im 88/88]
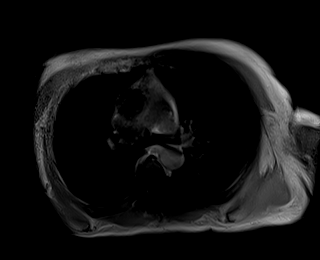

[Series 16: cor obl thk · sagittal · 50.0mm · 0.78mm/px · 1 of 9 slices shown]
[im 1/9]
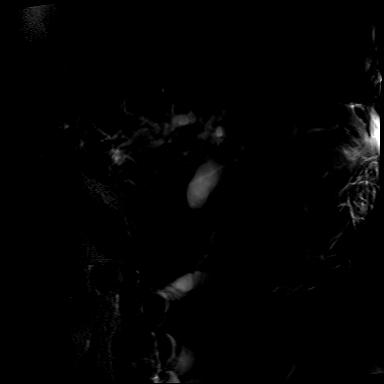

[Series 17: T2 · axial · 6.0mm · 1.56mm/px · 1 of 47 slices shown (2 of 2)]
[im 1/47]
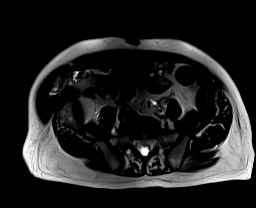

[Series 19: T1 dynamic · axial · 3.0mm · 1.25mm/px · z∈[-119,+214]mm · 3 of 112 slices shown (1 of 9)]
[im 1/112]
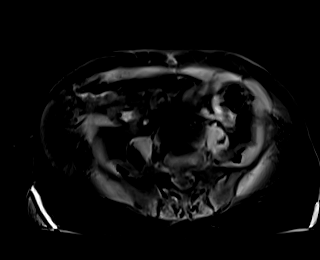
[im 56/112]
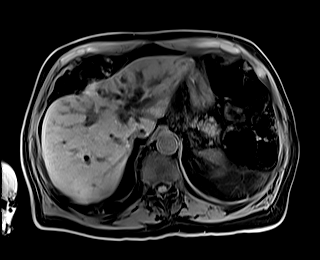
[im 112/112]
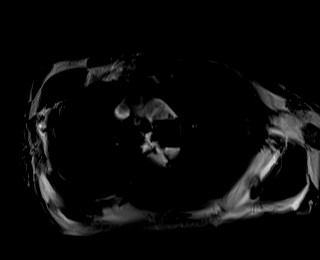

[Series 23: T1 dynamic · axial · 3.0mm · 1.25mm/px · z∈[-119,+214]mm · 3 of 112 slices shown (2 of 9)]
[im 1/112]
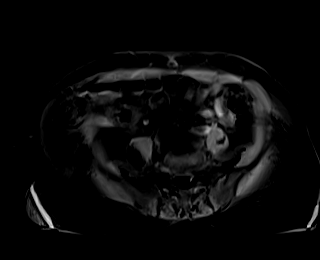
[im 56/112]
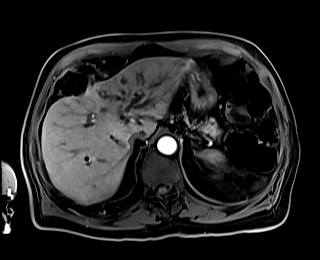
[im 112/112]
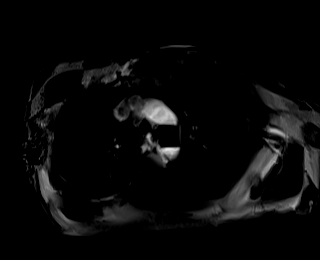

[Series 24: T1 dynamic · axial · 3.0mm · 1.25mm/px · z∈[-119,+214]mm · 3 of 112 slices shown (3 of 9)]
[im 1/112]
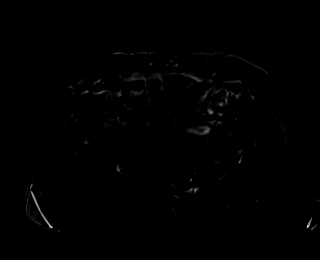
[im 56/112]
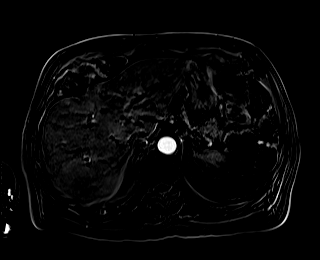
[im 112/112]
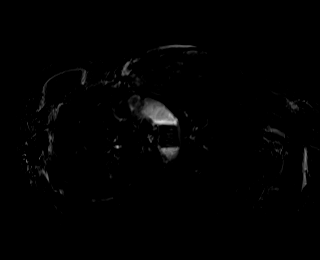

[Series 27: T1 dynamic · axial · 3.0mm · 1.25mm/px · z∈[-119,+214]mm · 3 of 112 slices shown (4 of 9)]
[im 1/112]
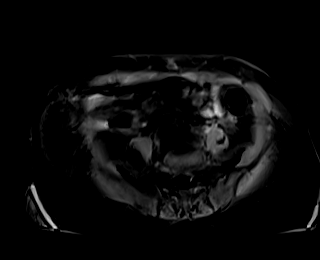
[im 56/112]
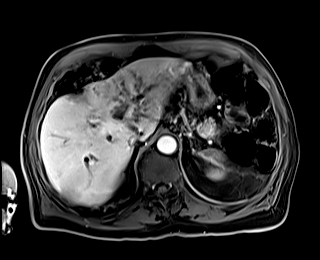
[im 112/112]
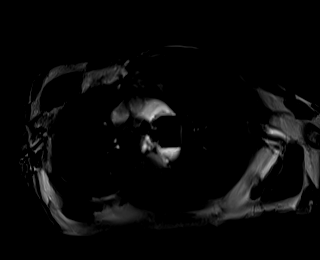

[Series 28: T1 dynamic · axial · 3.0mm · 1.25mm/px · z∈[-119,+214]mm · 3 of 112 slices shown (5 of 9)]
[im 1/112]
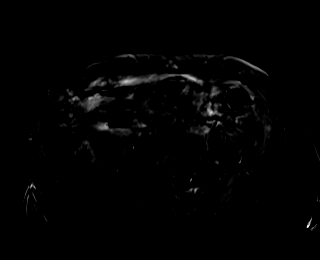
[im 56/112]
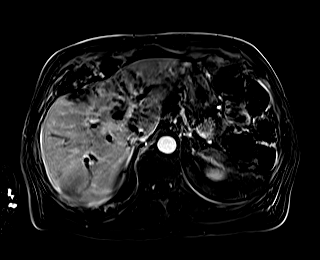
[im 112/112]
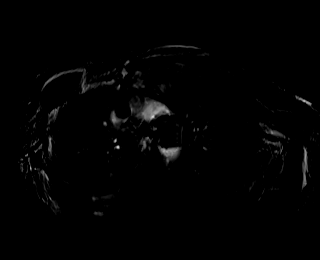

[Series 31: T1 dynamic · axial · 3.0mm · 1.25mm/px · z∈[-119,+214]mm · 3 of 112 slices shown (6 of 9)]
[im 1/112]
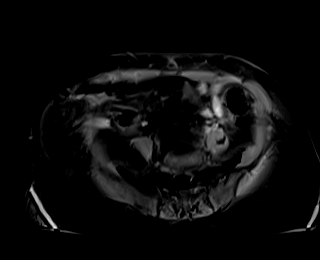
[im 56/112]
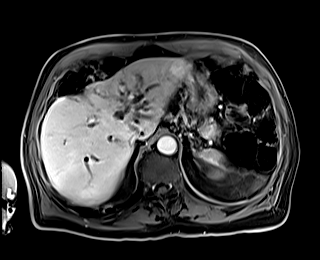
[im 112/112]
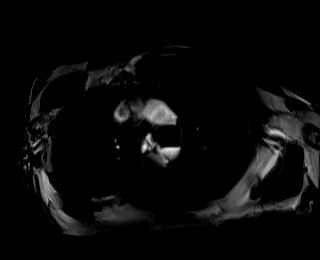

[Series 32: T1 dynamic · axial · 3.0mm · 1.25mm/px · z∈[-119,+214]mm · 3 of 112 slices shown (7 of 9)]
[im 1/112]
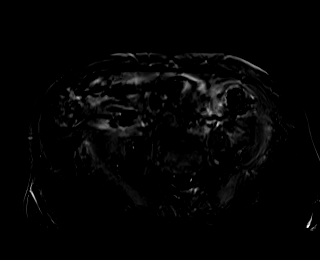
[im 56/112]
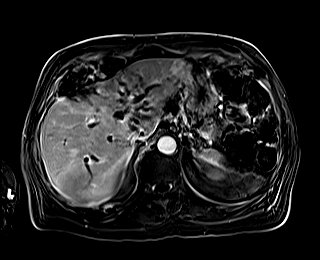
[im 112/112]
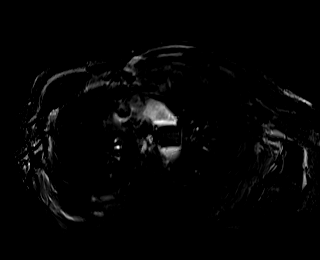

[Series 34: T1 dynamic · coronal · 5.0mm · 1.41mm/px · 2 of 56 slices shown (8 of 9)]
[im 1/56]
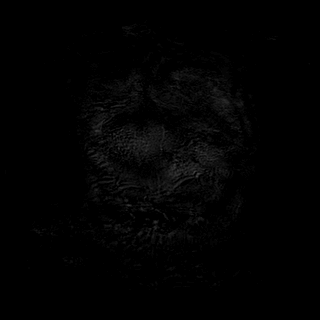
[im 56/56]
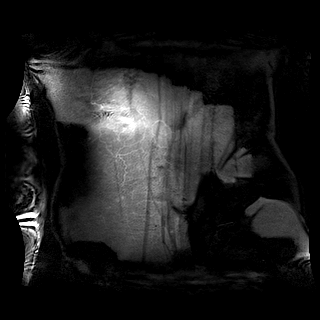

[Series 37: T1 dynamic · axial · 3.0mm · 1.25mm/px · z∈[-119,+46]mm · 2 of 112 slices shown (9 of 9)]
[im 1/112]
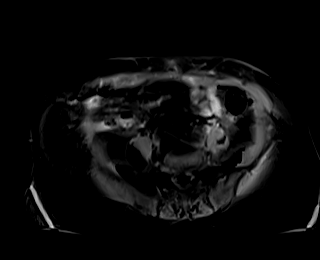
[im 56/112]
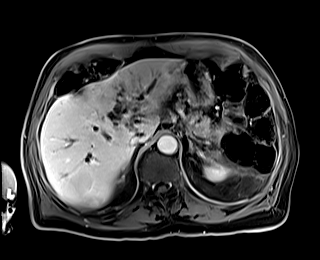

[41 of 48 positions shown; findings below may reference images not displayed]

FINDINGS: Lower chest: Trace bilateral pleural effusions.

Hepatobiliary: No suspicious liver lesions. Severe biliary ductal
dilation abdominal pain both the intrahepatic extrahepatic bile
ducts, common bile duct measures up to 2.0 cm. Abrupt cutoff is seen
at the distal common bile duct. No evidence of choledocholithiasis.
No obstructing mass is seen although motion artifact on
contrast-enhanced imaging somewhat limits evaluation gallbladder is
filled with T2 hypointense and T1 hyperintense material, likely
sludge or contrast material related to prior exam.

Pancreas: No pancreatic ductal dilation. Small cystic lesions of the
pancreatic tail, largest measures 10 mm on series 13, image 16.

Spleen:  Within normal limits in size and appearance.

Adrenals/Urinary Tract: No masses identified. No evidence of
hydronephrosis.

Stomach/Bowel: Visualized portions within the abdomen are
unremarkable.

Vascular/Lymphatic: No pathologically enlarged lymph nodes
identified. No abdominal aortic aneurysm demonstrated.

Other:  Trace perihepatic ascites.

Musculoskeletal: No suspicious bone lesions identified.
IMPRESSION: 1. Severe biliary ductal dilation of both the intra and extrahepatic
bile ducts with abrupt cutoff seen at the distal common bile duct.
Findings are concerning for stricture or obstructing tumor. No
obstructing mass is seen, although motion artifact on
contrast-enhanced imaging limits evaluation. Recommend correlation
with ERCP.
2. Small cystic lesions of the pancreatic tail, largest measures 10
mm. Recommend follow up pre and post contrast MRI/MRCP or pancreatic
protocol CT in 2 years. This recommendation follows ACR consensus
guidelines: Management of Incidental Pancreatic Cysts: A White Paper
of the ACR Incidental Findings Committee. [HOSPITAL]
3. Trace perihepatic ascites and trace bilateral pleural effusions.

## 2021-10-07 IMAGING — MR MR ABDOMEN WO/W CM MRCP
18 of 21 series · 41 of 48 positions shown · IV contrast (gadavist)
Comparison: None Available.

CLINICAL DATA: Biliary obstruction suspected.

EXAM:
MRI ABDOMEN WITHOUT AND WITH CONTRAST (INCLUDING MRCP)
TECHNIQUE: Multiplanar multisequence MR imaging of the abdomen was performed
both before and after the administration of intravenous contrast.
Heavily T2-weighted images of the biliary and pancreatic ducts were
obtained, and three-dimensional MRCP images were rendered by post
processing.
CONTRAST:  8mL GADAVIST GADOBUTROL 1 MMOL/ML IV SOLN

[Series 2: T2 fat-sat · axial · 6.0mm · 1.25mm/px · 1 of 45 slices shown]
[im 1/45]
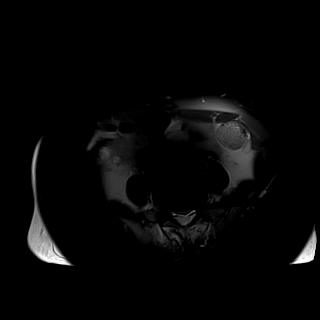

[Series 4: DWI · axial · 6.0mm · 1.49mm/px · z∈[-133,+198]mm · 3 of 94 slices shown (1 of 2)]
[im 1/94]
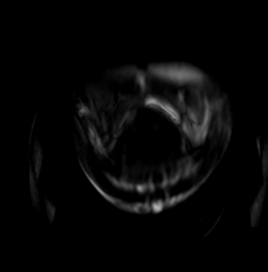
[im 47/94]
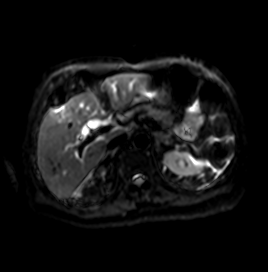
[im 94/94]
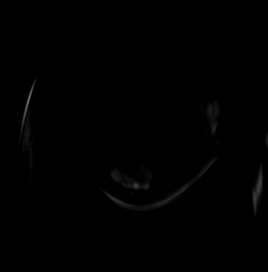

[Series 5: DWI · axial · 6.0mm · 1.49mm/px · 1 of 47 slices shown (2 of 2)]
[im 1/47]
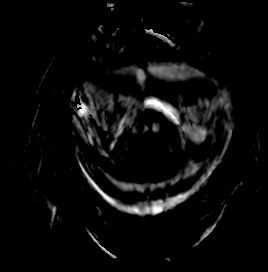

[Series 7: cor_3d_spc_trig · coronal · 1.0mm · 0.49mm/px · 2 of 72 slices shown]
[im 1/72]
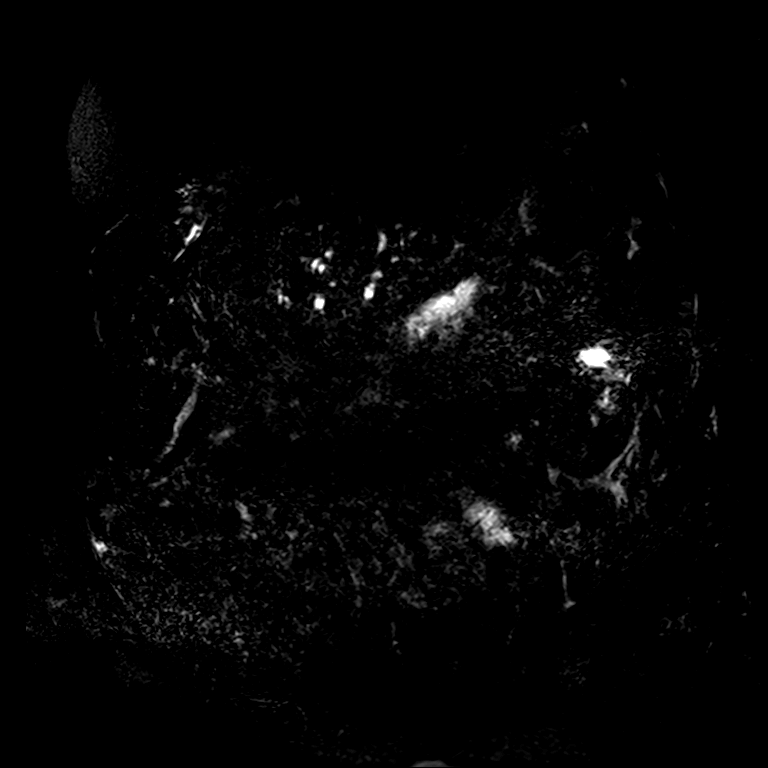
[im 72/72]
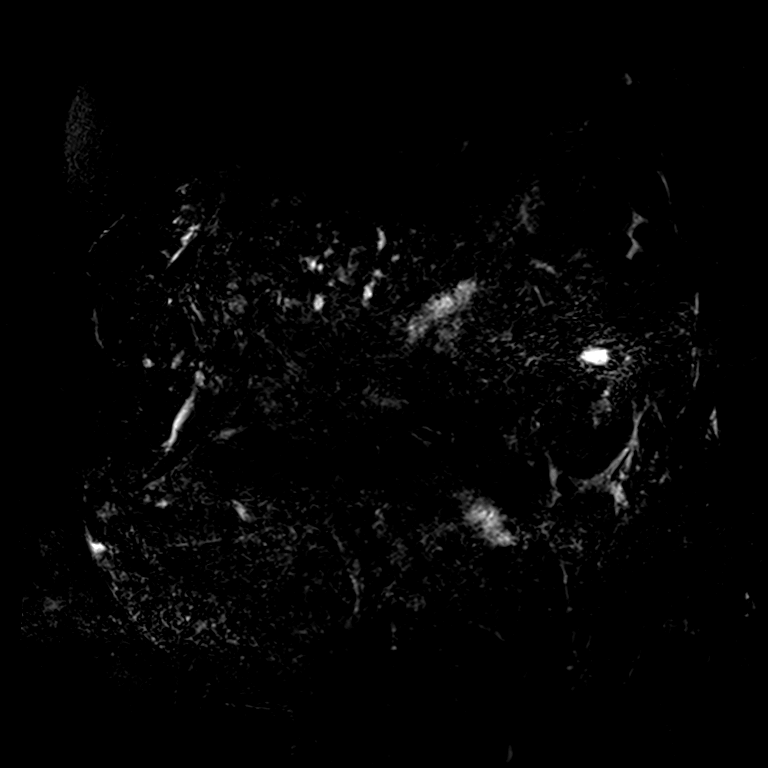

[Series 11: T2 · coronal · 6.0mm · 1.48mm/px · 1 of 39 slices shown (1 of 2)]
[im 1/39]
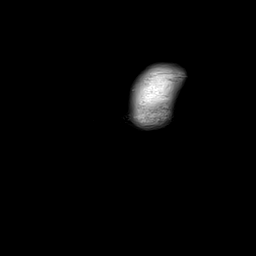

[Series 13: T1 · axial · 3.5mm · 1.25mm/px · z∈[-119,+186]mm · 3 of 88 slices shown (1 of 2)]
[im 1/88]
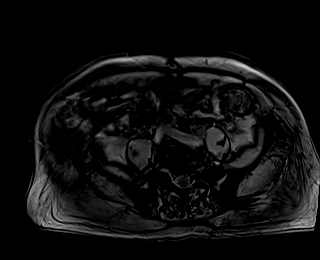
[im 44/88]
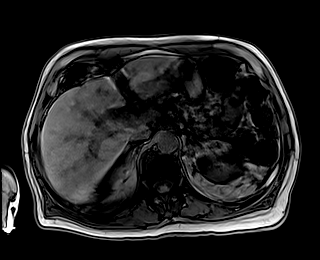
[im 88/88]
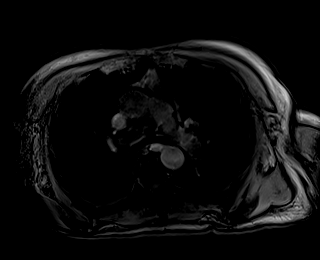

[Series 14: T1 · axial · 3.5mm · 1.25mm/px · z∈[-119,+186]mm · 3 of 88 slices shown (2 of 2)]
[im 1/88]
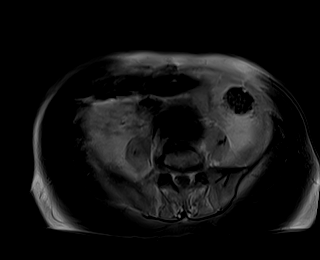
[im 44/88]
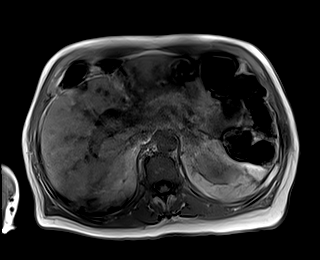
[im 88/88]
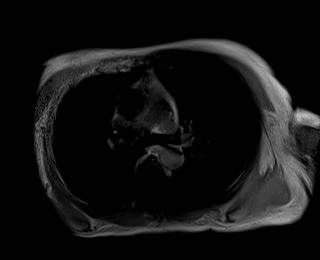

[Series 15: cor obl thk · sagittal · 50.0mm · 0.78mm/px · 1 of 9 slices shown]
[im 1/9]
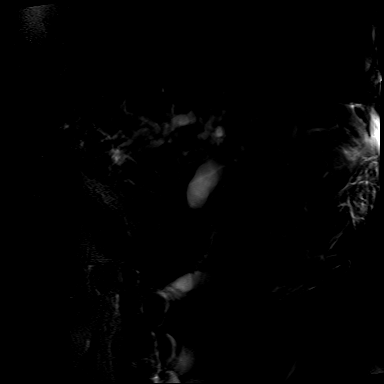

[Series 16: T2 · axial · 6.0mm · 1.56mm/px · 1 of 47 slices shown (2 of 2)]
[im 1/47]
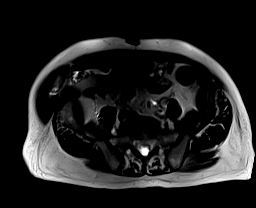

[Series 18: T1 dynamic · axial · 3.0mm · 1.25mm/px · z∈[-119,+214]mm · 3 of 112 slices shown (1 of 9)]
[im 1/112]
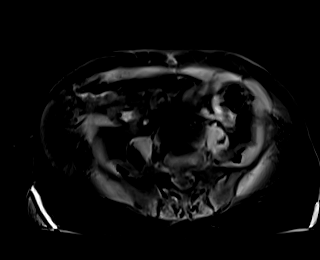
[im 56/112]
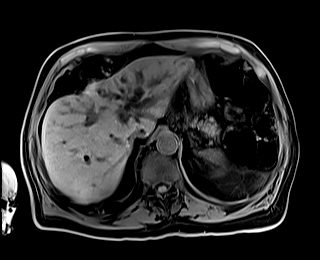
[im 112/112]
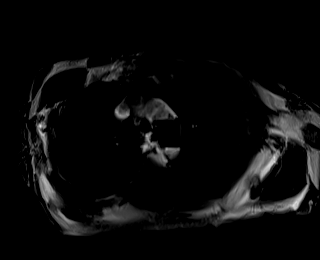

[Series 22: T1 dynamic · axial · 3.0mm · 1.25mm/px · z∈[-119,+214]mm · 3 of 112 slices shown (2 of 9)]
[im 1/112]
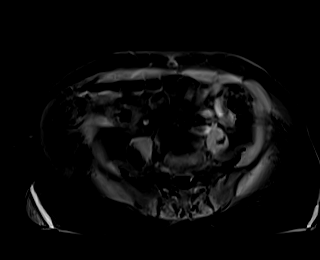
[im 56/112]
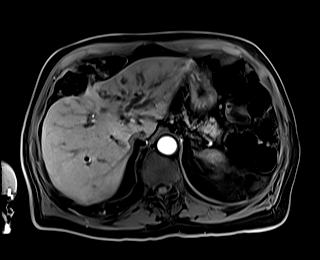
[im 112/112]
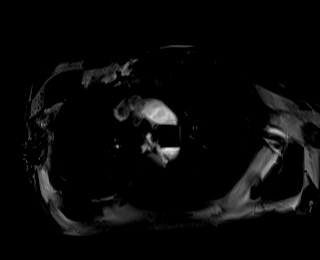

[Series 23: T1 dynamic · axial · 3.0mm · 1.25mm/px · z∈[-119,+214]mm · 3 of 112 slices shown (3 of 9)]
[im 1/112]
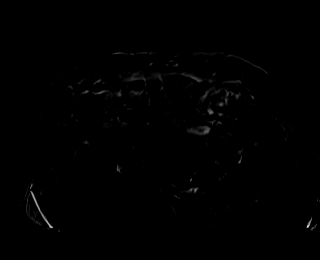
[im 56/112]
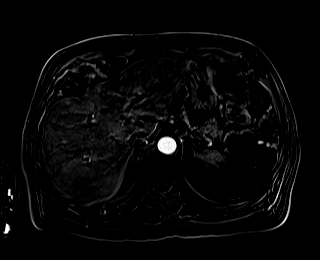
[im 112/112]
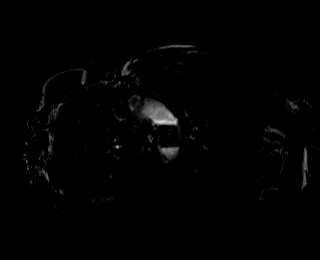

[Series 26: T1 dynamic · axial · 3.0mm · 1.25mm/px · z∈[-119,+214]mm · 3 of 112 slices shown (4 of 9)]
[im 1/112]
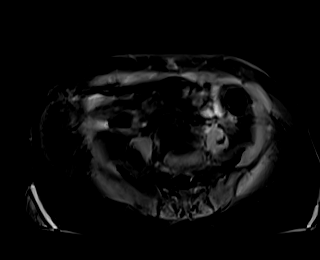
[im 56/112]
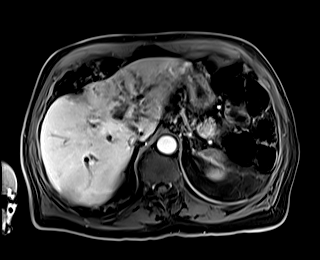
[im 112/112]
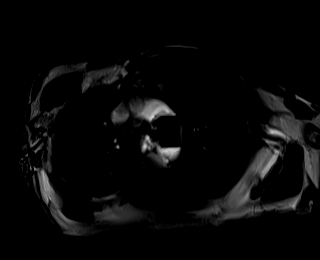

[Series 27: T1 dynamic · axial · 3.0mm · 1.25mm/px · z∈[-119,+214]mm · 3 of 112 slices shown (5 of 9)]
[im 1/112]
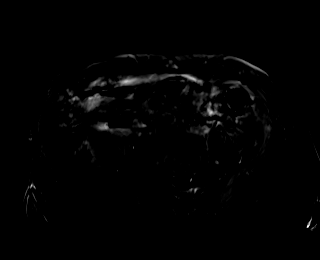
[im 56/112]
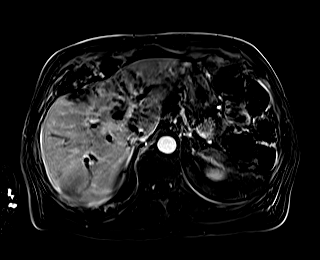
[im 112/112]
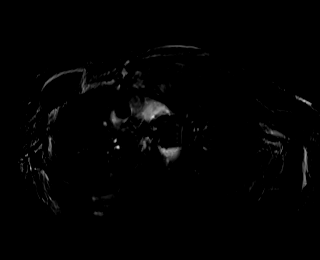

[Series 30: T1 dynamic · axial · 3.0mm · 1.25mm/px · z∈[-119,+214]mm · 3 of 112 slices shown (6 of 9)]
[im 1/112]
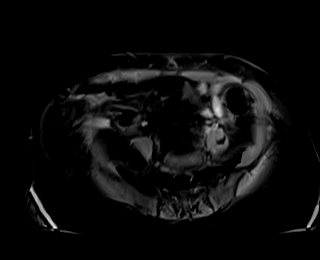
[im 56/112]
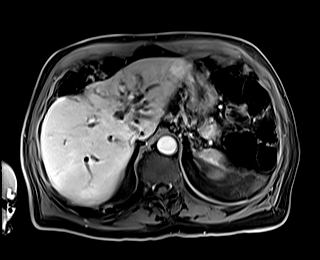
[im 112/112]
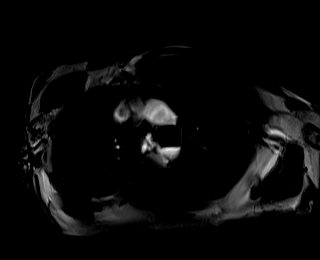

[Series 31: T1 dynamic · axial · 3.0mm · 1.25mm/px · z∈[-119,+214]mm · 3 of 112 slices shown (7 of 9)]
[im 1/112]
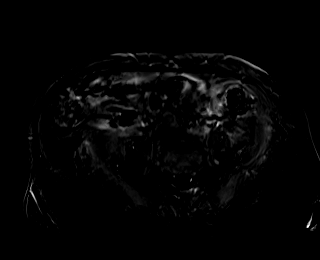
[im 56/112]
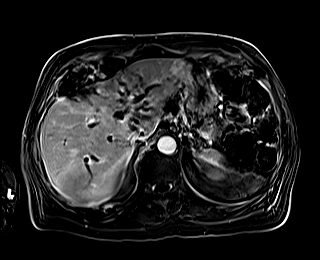
[im 112/112]
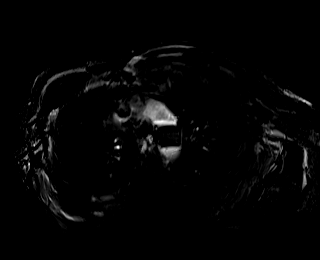

[Series 33: T1 dynamic · coronal · 5.0mm · 1.41mm/px · 2 of 56 slices shown (8 of 9)]
[im 1/56]
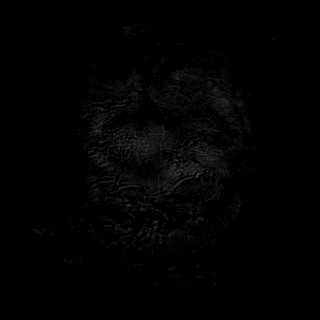
[im 56/56]
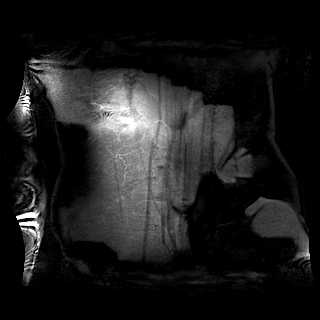

[Series 36: T1 dynamic · axial · 3.0mm · 1.25mm/px · z∈[-119,+46]mm · 2 of 112 slices shown (9 of 9)]
[im 1/112]
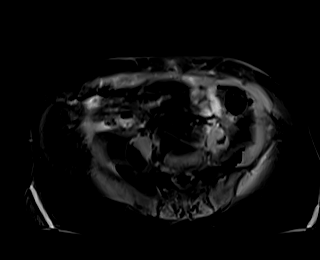
[im 56/112]
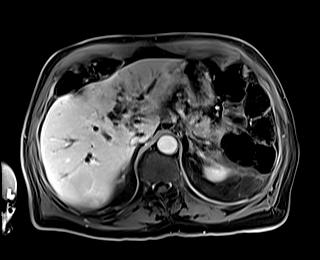

[41 of 48 positions shown; findings below may reference images not displayed]

FINDINGS: Lower chest: Trace bilateral pleural effusions.

Hepatobiliary: No suspicious liver lesions. Severe biliary ductal
dilation abdominal pain both the intrahepatic extrahepatic bile
ducts, common bile duct measures up to 2.0 cm. Abrupt cutoff is seen
at the distal common bile duct. No evidence of choledocholithiasis.
No obstructing mass is seen although motion artifact on
contrast-enhanced imaging somewhat limits evaluation gallbladder is
filled with T2 hypointense and T1 hyperintense material, likely
sludge or contrast material related to prior exam.

Pancreas: No pancreatic ductal dilation. Small cystic lesions of the
pancreatic tail, largest measures 10 mm on series 13, image 16.

Spleen:  Within normal limits in size and appearance.

Adrenals/Urinary Tract: No masses identified. No evidence of
hydronephrosis.

Stomach/Bowel: Visualized portions within the abdomen are
unremarkable.

Vascular/Lymphatic: No pathologically enlarged lymph nodes
identified. No abdominal aortic aneurysm demonstrated.

Other:  Trace perihepatic ascites.

Musculoskeletal: No suspicious bone lesions identified.
IMPRESSION: 1. Severe biliary ductal dilation of both the intra and extrahepatic
bile ducts with abrupt cutoff seen at the distal common bile duct.
Findings are concerning for stricture or obstructing tumor. No
obstructing mass is seen, although motion artifact on
contrast-enhanced imaging limits evaluation. Recommend correlation
with ERCP.
2. Small cystic lesions of the pancreatic tail, largest measures 10
mm. Recommend follow up pre and post contrast MRI/MRCP or pancreatic
protocol CT in 2 years. This recommendation follows ACR consensus
guidelines: Management of Incidental Pancreatic Cysts: A White Paper
of the ACR Incidental Findings Committee. [HOSPITAL]
3. Trace perihepatic ascites and trace bilateral pleural effusions.

## 2021-10-07 MED ORDER — GADOBUTROL 1 MMOL/ML IV SOLN
8.0000 mL | Freq: Once | INTRAVENOUS | Status: AC | PRN
  Administered 2021-10-07: 8 mL via INTRAVENOUS

## 2021-10-07 MED ORDER — LACTULOSE 10 GM/15ML PO SOLN
10.0000 g | Freq: Every day | ORAL | Status: DC
Start: 2021-10-07 — End: 2021-10-12
  Administered 2021-10-07 – 2021-10-12 (×5): 10 g via ORAL
  Filled 2021-10-07 (×6): qty 15

## 2021-10-07 MED ORDER — POTASSIUM CHLORIDE CRYS ER 20 MEQ PO TBCR
40.0000 meq | EXTENDED_RELEASE_TABLET | ORAL | Status: AC
  Administered 2021-10-07: 40 meq via ORAL
  Filled 2021-10-07: qty 2

## 2021-10-07 NOTE — Consult Note (Signed)
Primary Care Physician:  London Pepper, MD Primary Gastroenterologist: Sadie Haber PCP  Reason for Consultation: Jaundice, abnormal CT scan  HPI: Patrick Collier is a 81 y.o. male currently admitted to the hospital for further evaluation of jaundice.  Looks like he had outpatient blood work done for evaluation of change in skin color which showed jaundice with elevated bilirubin.  Subsequently he had a CT abdomen pelvis with contrast done at [facility] yesterday which showed severe dilation of the intra and extrahepatic duct with a abrupt narrowing at the head of the pancreas with apparent hyperdense material within the common bile duct or surrounding area concerning for cholangiocarcinoma or CBD stricture versus choledocholithiasis.  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  Blood work upon admission showed bilirubin of 11.3, AST 232, ALT 159 and alkaline phosphatase 583.  Normal CBC.  Normal lipase.  Patient seen and examined at bedside.  His wife noted skin change around 2 to 3 weeks ago.  He was also having dark urine.  Denies any abdominal pain.  10 to 15 pound weight loss in last few weeks.  Appetite is good.   Past Medical History:  Diagnosis Date   Arthritis    Back pain    Heart murmur    History of kidney stones    ICH (intracerebral hemorrhage) ([place])     Past Surgical History:  Procedure Laterality Date   BACK SURGERY     INGUINAL HERNIA REPAIR     KNEE ARTHROPLASTY Right 05/16/2017   Procedure: RIGHT TOTAL KNEE ARTHROPLASTY WITH COMPUTER NAVIGATION;  Surgeon: Rod Can, MD;  Location: [facility];  Service: Orthopedics;  Laterality: Right;  Needs RNFA   NECK SURGERY     TONSILLECTOMY      Prior to Admission medications   Medication Sig Start Date End Date Taking? Authorizing Provider  acetaminophen (TYLENOL) 650 MG CR tablet Take 500 mg by mouth every 8 (eight) hours as needed for pain.     [provider]    Scheduled Meds:  enoxaparin (LOVENOX)  injection  40 mg Subcutaneous Q24H   lactulose  10 g Oral Daily   potassium chloride  40 mEq Oral Q2H   Continuous Infusions:  dextrose 5% lactated ringers 100 mL/hr at 10/07/21 0500   PRN Meds:.morphine injection, ondansetron **OR** ondansetron (ZOFRAN) IV  Allergies as of 10/06/2021   (No Known Allergies)    Family History  Problem Relation Age of Onset   Diabetes Mother     Social History   Socioeconomic History   Marital status: Married    Spouse name: Not on file   Number of children: 3   Years of education: Not on file   Highest education level: Not on file  Occupational History    Comment: Retired  Tobacco Use   Smoking status: Former   Smokeless tobacco: Never  Scientific laboratory technician Use: Never used  Substance and Sexual Activity   Alcohol use: Yes    Comment: one beer per day   Drug use: No   Sexual activity: Not on file  Other Topics Concern   Not on file  Social History Narrative   Not on file   Social Determinants of Health   Financial Resource Strain: Not on file  Food Insecurity: Not on file  Transportation Needs: Not on file  Physical Activity: Not on file  Stress: Not on file  Social Connections: Not on file  Intimate Partner Violence: Not on file  Review of Systems: 12 point review of system negative as mentioned in HPI  Physical Exam: Vital signs: Vitals:   10/07/21 0216 10/07/21 0526  BP: 131/68 (!) 114/50  Pulse: (!) 47 (!) 46  Resp: 20 20  Temp: 98.6 F (37 C) 98.7 F (37.1 C)  SpO2: 96% 98%   Last BM Date : 10/05/21 General:   Alert,  Well-developed, well-nourished, pleasant and cooperative in NAD HEENT : NS, AT, extraocular movement intact.  Scleral icterus noted Lungs:  Clear throughout to auscultation.   No wheezes, crackles, or rhonchi. No acute distress. Heart:  Regular rate and rhythm; no murmurs, clicks, rubs,  or gallops. Abdomen: Soft, nontender, nondistended, bowel sounds present.  No peritoneal signs Lower  extremity -no edema Neuro -alert and oriented x3 Psych -mood and affect normal Rectal:  Deferred  GI:  Lab Results: Recent Labs    10/06/21 1910 10/07/21 0458  WBC 7.8 7.7  HGB 13.8 12.5*  HCT 41.1 36.1*  PLT 229 198   BMET Recent Labs    10/06/21 1910 10/07/21 0458  NA 139 136  K 3.4* 3.1*  CL 102 103  CO2 29 27  GLUCOSE 157* 136*  BUN 14 12  CREATININE 0.96 0.87  CALCIUM 8.8* 8.3*   LFT Recent Labs    10/07/21 0458  PROT 6.5  ALBUMIN 2.5*  AST 188*  ALT 135*  ALKPHOS 508*  BILITOT 9.9*   PT/INR Recent Labs    10/06/21 2008  LABPROT 15.4*  INR 1.2     Studies/Results: No results found.  Impression/Plan: -Painless jaundice with CT scan done at outside facility concerning for cholangiocarcinoma.  CT showed severe dilation of the intra and extrahepatic duct with a abrupt narrowing at the head of the pancreas with apparent hyperdense material within the common bile duct or surrounding area concerning for cholangiocarcinoma or CBD stricture versus choledocholithiasis.  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  Recommendation ----------------------- -Follow MRI abdomen with MRCP with and without contrast. -Need for EUS and  ERCP with stent placement depending on MRI findings discussed with the patient.  Procedures discussed in detail with picture illustration -CA 19-9-9 pending -GI will follow    LOS: 1 day   Otis Brace  MD, FACP 10/07/2021, 11:01 AM  Contact #  [phone]

## 2021-10-07 NOTE — Plan of Care (Signed)

## 2021-10-07 NOTE — Hospital Course (Signed)
81 year old male with a past medical history of arthritis, intracranial hemorrhage, chronic back pain.  Presents to the hospital with complaints of abnormal CT scan. Patient has been noticing jaundice for last 1 week and pale color stool for last few weeks.  Went to see PCP and had a CT scan of the abdomen.  There was some concern for mass on the pancreas and patient was sent to the ER for further work-up. Found to have dilated CBD with possible stricture/mass without any significant large pancreatic or liver mass. [facility] following. Patient will require EUS ERCP.

## 2021-10-07 NOTE — H&P (View-Only) (Signed)
Primary Care Physician:  London Pepper, MD Primary Gastroenterologist: Sadie Haber PCP  Reason for Consultation: Jaundice, abnormal CT scan  HPI: Patrick Collier is a 81 y.o. male currently admitted to the hospital for further evaluation of jaundice.  Looks like he had outpatient blood work done for evaluation of change in skin color which showed jaundice with elevated bilirubin.  Subsequently he had a CT abdomen pelvis with contrast done at [facility] yesterday which showed severe dilation of the intra and extrahepatic duct with a abrupt narrowing at the head of the pancreas with apparent hyperdense material within the common bile duct or surrounding area concerning for cholangiocarcinoma or CBD stricture versus choledocholithiasis.  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  Blood work upon admission showed bilirubin of 11.3, AST 232, ALT 159 and alkaline phosphatase 583.  Normal CBC.  Normal lipase.  Patient seen and examined at bedside.  His wife noted skin change around 2 to 3 weeks ago.  He was also having dark urine.  Denies any abdominal pain.  10 to 15 pound weight loss in last few weeks.  Appetite is good.   Past Medical History:  Diagnosis Date   Arthritis    Back pain    Heart murmur    History of kidney stones    ICH (intracerebral hemorrhage) ([place])     Past Surgical History:  Procedure Laterality Date   BACK SURGERY     INGUINAL HERNIA REPAIR     KNEE ARTHROPLASTY Right 05/16/2017   Procedure: RIGHT TOTAL KNEE ARTHROPLASTY WITH COMPUTER NAVIGATION;  Surgeon: Rod Can, MD;  Location: [facility];  Service: Orthopedics;  Laterality: Right;  Needs RNFA   NECK SURGERY     TONSILLECTOMY      Prior to Admission medications   Medication Sig Start Date End Date Taking? Authorizing Provider  acetaminophen (TYLENOL) 650 MG CR tablet Take 500 mg by mouth every 8 (eight) hours as needed for pain.     [provider]    Scheduled Meds:  enoxaparin (LOVENOX)  injection  40 mg Subcutaneous Q24H   lactulose  10 g Oral Daily   potassium chloride  40 mEq Oral Q2H   Continuous Infusions:  dextrose 5% lactated ringers 100 mL/hr at 10/07/21 0500   PRN Meds:.morphine injection, ondansetron **OR** ondansetron (ZOFRAN) IV  Allergies as of 10/06/2021   (No Known Allergies)    Family History  Problem Relation Age of Onset   Diabetes Mother     Social History   Socioeconomic History   Marital status: Married    Spouse name: Not on file   Number of children: 3   Years of education: Not on file   Highest education level: Not on file  Occupational History    Comment: Retired  Tobacco Use   Smoking status: Former   Smokeless tobacco: Never  Scientific laboratory technician Use: Never used  Substance and Sexual Activity   Alcohol use: Yes    Comment: one beer per day   Drug use: No   Sexual activity: Not on file  Other Topics Concern   Not on file  Social History Narrative   Not on file   Social Determinants of Health   Financial Resource Strain: Not on file  Food Insecurity: Not on file  Transportation Needs: Not on file  Physical Activity: Not on file  Stress: Not on file  Social Connections: Not on file  Intimate Partner Violence: Not on file  Review of Systems: 12 point review of system negative as mentioned in HPI  Physical Exam: Vital signs: Vitals:   10/07/21 0216 10/07/21 0526  BP: 131/68 (!) 114/50  Pulse: (!) 47 (!) 46  Resp: 20 20  Temp: 98.6 F (37 C) 98.7 F (37.1 C)  SpO2: 96% 98%   Last BM Date : 10/05/21 General:   Alert,  Well-developed, well-nourished, pleasant and cooperative in NAD HEENT : NS, AT, extraocular movement intact.  Scleral icterus noted Lungs:  Clear throughout to auscultation.   No wheezes, crackles, or rhonchi. No acute distress. Heart:  Regular rate and rhythm; no murmurs, clicks, rubs,  or gallops. Abdomen: Soft, nontender, nondistended, bowel sounds present.  No peritoneal signs Lower  extremity -no edema Neuro -alert and oriented x3 Psych -mood and affect normal Rectal:  Deferred  GI:  Lab Results: Recent Labs    10/06/21 1910 10/07/21 0458  WBC 7.8 7.7  HGB 13.8 12.5*  HCT 41.1 36.1*  PLT 229 198   BMET Recent Labs    10/06/21 1910 10/07/21 0458  NA 139 136  K 3.4* 3.1*  CL 102 103  CO2 29 27  GLUCOSE 157* 136*  BUN 14 12  CREATININE 0.96 0.87  CALCIUM 8.8* 8.3*   LFT Recent Labs    10/07/21 0458  PROT 6.5  ALBUMIN 2.5*  AST 188*  ALT 135*  ALKPHOS 508*  BILITOT 9.9*   PT/INR Recent Labs    10/06/21 2008  LABPROT 15.4*  INR 1.2     Studies/Results: No results found.  Impression/Plan: -Painless jaundice with CT scan done at outside facility concerning for cholangiocarcinoma.  CT showed severe dilation of the intra and extrahepatic duct with a abrupt narrowing at the head of the pancreas with apparent hyperdense material within the common bile duct or surrounding area concerning for cholangiocarcinoma or CBD stricture versus choledocholithiasis.  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  Recommendation ----------------------- -Follow MRI abdomen with MRCP with and without contrast. -Need for EUS and  ERCP with stent placement depending on MRI findings discussed with the patient.  Procedures discussed in detail with picture illustration -CA 19-9-9 pending -GI will follow    LOS: 1 day   Otis Brace  MD, FACP 10/07/2021, 11:01 AM  Contact #  [phone]

## 2021-10-07 NOTE — Progress Notes (Signed)
  Progress Note Patient: Patrick Collier [id number] DOB: [birth year] DOA: 10/06/2021  DOS: the patient was seen and examined on 10/07/2021  Brief hospital course: 81 year old male with a past medical history of arthritis, intracranial hemorrhage, chronic back pain.  Presents to the hospital with complaints of abnormal CT scan. Patient has been noticing jaundice for last 1 week and pale color stool for last few weeks.  Went to see PCP and had a CT scan of the abdomen.  There was some concern for mass on the pancreas and patient was sent to the ER for further work-up. Found to have dilated CBD with possible stricture/mass without any significant large pancreatic or liver mass. [facility] following. Patient will require EUS ERCP.  Assessment and Plan: Painless obstructive jaundice. Hyperbilirubinemia Concerning for malignancy. 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 There are also pancreatic body cyst. CA 19-9 ordered.  Currently pending. MRCP continues to show severe intra and extrahepatic dilation of the bile duct with abrupt cutoff at distal CBD concerning for stricture or obstructive tumor. Patient will require ERCP/EUS.  Timing based on GI.  Hypokalemia. We will replace. Monitor.  Elevated ammonia level. Does not appear to have any encephalopathy for now. Continue lactulose. Monitor.  Mild hyperglycemia. BG elevated. We will check hemoglobin A1c.  Subjective: No nausea or vomiting.  No fever no chills.  Mild abdominal discomfort but no abdominal pain.  Continues to report pale-colored stool.  Physical Exam: Vitals:   10/07/21 0216 10/07/21 0459 10/07/21 0526 10/07/21 1355  BP: 131/68  (!) 114/50 128/69  Pulse: (!) 47  (!) 46 (!) 44  Resp: '20  20 18  '$ Temp: 98.6 F (37 C)  98.7 F (37.1 C) 98.5 F (36.9 C)  TempSrc: Oral  Oral Oral  SpO2: 96%  98% 100%  Weight:   79.1 kg    Height:  '5\' 8"'$  (1.727 m)     General: Appear in mild distress; no visible Abnormal Neck Mass Or lumps, Conjunctiva normal Cardiovascular: S1 and S2 Present, no Murmur, Respiratory: good respiratory effort, Bilateral Air entry present and CTA, no Crackles, no wheezes Abdomen: Bowel Sound present, Non tender  Extremities: no Pedal edema Neurology: alert and oriented x3. Gait not checked due to patient safety concerns   Data Reviewed: I have Reviewed nursing notes, Vitals, and Lab results since pt's last encounter. Pertinent lab results CBC and CMP and INR I have ordered test including CBC and CMP and INR and ammonia level I have reviewed the last note from GI,    Family Communication: Daughter and wife at bedside  Disposition: Status is: Inpatient Remains inpatient appropriate because: Requiring IV hydration and further work-up for transaminitis/obstructive jaundice.  Author: Berle Mull, MD 10/07/2021 6:53 PM  Please look on [email or web address] to find out who is on call.

## 2021-10-08 DIAGNOSIS — K831 Obstruction of bile duct: Secondary | ICD-10-CM | POA: Diagnosis not present

## 2021-10-08 LAB — CBC
HCT: 35.1 % — ABNORMAL LOW (ref 39.0–52.0)
Hemoglobin: 12 g/dL — ABNORMAL LOW (ref 13.0–17.0)
MCH: 33.1 pg (ref 26.0–34.0)
MCHC: 34.2 g/dL (ref 30.0–36.0)
MCV: 96.7 fL (ref 80.0–100.0)
Platelets: 185 10*3/uL (ref 150–400)
RBC: 3.63 MIL/uL — ABNORMAL LOW (ref 4.22–5.81)
RDW: 15.5 % (ref 11.5–15.5)
WBC: 7.4 10*3/uL (ref 4.0–10.5)
nRBC: 0 % (ref 0.0–0.2)

## 2021-10-08 LAB — COMPREHENSIVE METABOLIC PANEL
ALT: 120 U/L — ABNORMAL HIGH (ref 0–44)
AST: 151 U/L — ABNORMAL HIGH (ref 15–41)
Albumin: 2.2 g/dL — ABNORMAL LOW (ref 3.5–5.0)
Alkaline Phosphatase: 463 U/L — ABNORMAL HIGH (ref 38–126)
Anion gap: 6 (ref 5–15)
BUN: 13 mg/dL (ref 8–23)
CO2: 27 mmol/L (ref 22–32)
Calcium: 8 mg/dL — ABNORMAL LOW (ref 8.9–10.3)
Chloride: 104 mmol/L (ref 98–111)
Creatinine, Ser: 0.82 mg/dL (ref 0.61–1.24)
GFR, Estimated: >60 mL/min (ref >60)
Glucose, Bld: 135 mg/dL — ABNORMAL HIGH (ref 70–99)
Potassium: 3.4 mmol/L — ABNORMAL LOW (ref 3.5–5.1)
Sodium: 137 mmol/L (ref 135–145)
Total Bilirubin: 8.8 mg/dL — ABNORMAL HIGH (ref 0.3–1.2)
Total Protein: 5.8 g/dL — ABNORMAL LOW (ref 6.5–8.1)

## 2021-10-08 LAB — MAGNESIUM: Magnesium: 2.1 mg/dL (ref 1.7–2.4)

## 2021-10-08 LAB — CANCER ANTIGEN 19-9: CA 19-9: 129 U/mL — ABNORMAL HIGH (ref 0–35)

## 2021-10-08 NOTE — Progress Notes (Signed)
[facility] Progress Note  Patrick Collier 81 y.o. 04/12/1941  CC: Painless jaundice   Subjective: Patient seen and examined at bedside.  Family at bedside.  MRI report discussed with patient and family with picture illustration.  No new symptoms since yesterday.  ROS : Afebrile, negative for chest pain   Objective: Vital signs in last 24 hours: Vitals:   10/07/21 2113 10/08/21 0524  BP: (!) 148/73 105/60  Pulse: (!) 44 (!) 47  Resp: 20 19  Temp: 98.5 F (36.9 C) 98.5 F (36.9 C)  SpO2: 100% 98%    Physical Exam:  General:  Alert, cooperative, no distress, appears stated age  Head:  Normocephalic, without obvious abnormality, atraumatic  Eyes:  Lateral icterus  Lungs:   Clear to auscultation bilaterally, respirations unlabored  Heart:  Regular rate and rhythm, S1, S2 normal  Abdomen:   Soft, non-tender, bowel sounds active all four quadrants,  no masses,   Extremities: Extremities normal, atraumatic, no  edema  Pulses: 2+ and symmetric    Lab Results: Recent Labs    10/07/21 0458 10/08/21 0432  NA 136 137  K 3.1* 3.4*  CL 103 104  CO2 27 27  GLUCOSE 136* 135*  BUN 12 13  CREATININE 0.87 0.82  CALCIUM 8.3* 8.0*  MG  --  2.1   Recent Labs    10/07/21 0458 10/08/21 0432  AST 188* 151*  ALT 135* 120*  ALKPHOS 508* 463*  BILITOT 9.9* 8.8*  PROT 6.5 5.8*  ALBUMIN 2.5* 2.2*   Recent Labs    10/06/21 1910 10/07/21 0458 10/08/21 0432  WBC 7.8 7.7 7.4  NEUTROABS 6.2  --   --   HGB 13.8 12.5* 12.0*  HCT 41.1 36.1* 35.1*  MCV 98.6 96.0 96.7  PLT 229 198 185   Recent Labs    10/06/21 2008  LABPROT 15.4*  INR 1.2      Assessment/Plan: -Painless jaundice with CT scan done at outside facility concerning for cholangiocarcinoma.  MRI MRCP with contrast yesterday showed severe bile duct dilation with CBD up to 2 cm with abrupt cut off at the distal common bile duct.   CT showed severe dilation of the intra and extrahepatic duct with a  abrupt narrowing at the head of the pancreas with apparent hyperdense material within the common bile duct or surrounding area concerning for cholangiocarcinoma or CBD stricture versus choledocholithiasis.  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   Recommendation ----------------------- -Patient will need EUS as well as ERCP with CBD stent placement.  Dr. Paulita Fujita is covering hospital service tomorrow and He will decide on timing of EUS and ERCP. -Procedures discussed with patient and family in detail with picture illustration. -Follow CA 19-9 -Repeat labs in the morning -I will follow   Otis Brace MD, FACP 10/08/2021, 12:29 PM  Contact #  [phone]

## 2021-10-08 NOTE — Progress Notes (Signed)
  Progress Note Patient: Patrick Collier [id number] DOB: 09/17/1940 DOA: 10/06/2021  DOS: the patient was seen and examined on 10/08/2021  Brief hospital course: 81 year old male with a past medical history of arthritis, intracranial hemorrhage, chronic back pain.  Presents to the hospital with complaints of abnormal CT scan. Patient has been noticing jaundice for last 1 week and pale color stool for last few weeks.  Went to see PCP and had a CT scan of the abdomen.  There was some concern for mass on the pancreas and patient was sent to the ER for further work-up. Found to have dilated CBD with possible stricture/mass without any significant large pancreatic or liver mass. [facility] following. Patient will require EUS ERCP.  Assessment and Plan: Painless obstructive jaundice. Hyperbilirubinemia Concerning for malignancy. 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 There are also pancreatic body cyst. CA 19-9 appears to be 129.  Elevated.  (I have not discussed this results with the patient yet). MRCP continues to show severe intra and extrahepatic dilation of the bile duct with abrupt cutoff at distal CBD concerning for stricture or obstructive tumor. Patient will require ERCP/EUS.  Timing based on GI.   Hypokalemia. Potassium 3.4 today.  Will replace. Monitor.   Elevated ammonia level. Does not appear to have any encephalopathy for now. Continue lactulose. Monitor.   Mild hyperglycemia. BG elevated. We will check hemoglobin A1c.  Subjective: Appears to have some abdominal pain.  No nausea no vomiting.  No fever no chills.  Physical Exam: Vitals:   10/07/21 2113 10/08/21 0524 10/08/21 1329 10/08/21 2025  BP: (!) 148/73 105/60 132/73 125/70  Pulse: (!) 44 (!) 47 (!) 44 (!) 48  Resp: '20 19 14 18  '$ Temp: 98.5 F (36.9 C) 98.5 F (36.9 C) (!) 97.5 F (36.4 C) 99.1 F (37.3  C)  TempSrc: Oral Oral Oral Oral  SpO2: 100% 98% 100% 98%  Weight:      Height:       General: Appear in mild distress; no visible Abnormal Neck Mass Or lumps, Conjunctiva normal Cardiovascular: S1 and S2 Present, no Murmur, Respiratory: good respiratory effort, Bilateral Air entry present and CTA, no Crackles, no wheezes Abdomen: Bowel Sound present, Non tender  Extremities: no Pedal edema Neurology: alert and oriented to time, place, and person  Gait not checked due to patient safety concerns   Data Reviewed: I have Reviewed nursing notes, Vitals, and Lab results since pt's last encounter. Pertinent lab results CBC and CMP I have ordered test including CBC and CMP    Family Communication: Wife at bedside  Disposition: Status is: Inpatient Remains inpatient appropriate because: Awaiting for further clearance from GI with regards to need for ERCP and EUS and timing.  Author: Berle Mull, MD 10/08/2021 9:33 PM  Please look on [email or web address] to find out who is on call.

## 2021-10-09 DIAGNOSIS — K831 Obstruction of bile duct: Secondary | ICD-10-CM | POA: Diagnosis not present

## 2021-10-09 LAB — CBC
HCT: 33.3 % — ABNORMAL LOW (ref 39.0–52.0)
Hemoglobin: 11.6 g/dL — ABNORMAL LOW (ref 13.0–17.0)
MCH: 33.7 pg (ref 26.0–34.0)
MCHC: 34.8 g/dL (ref 30.0–36.0)
MCV: 96.8 fL (ref 80.0–100.0)
Platelets: 195 10*3/uL (ref 150–400)
RBC: 3.44 MIL/uL — ABNORMAL LOW (ref 4.22–5.81)
RDW: 15.9 % — ABNORMAL HIGH (ref 11.5–15.5)
WBC: 7.6 10*3/uL (ref 4.0–10.5)
nRBC: 0 % (ref 0.0–0.2)

## 2021-10-09 LAB — COMPREHENSIVE METABOLIC PANEL
ALT: 115 U/L — ABNORMAL HIGH (ref 0–44)
AST: 143 U/L — ABNORMAL HIGH (ref 15–41)
Albumin: 2.2 g/dL — ABNORMAL LOW (ref 3.5–5.0)
Alkaline Phosphatase: 429 U/L — ABNORMAL HIGH (ref 38–126)
Anion gap: 6 (ref 5–15)
BUN: 12 mg/dL (ref 8–23)
CO2: 28 mmol/L (ref 22–32)
Calcium: 7.9 mg/dL — ABNORMAL LOW (ref 8.9–10.3)
Chloride: 103 mmol/L (ref 98–111)
Creatinine, Ser: 0.85 mg/dL (ref 0.61–1.24)
GFR, Estimated: >60 mL/min (ref >60)
Glucose, Bld: 129 mg/dL — ABNORMAL HIGH (ref 70–99)
Potassium: 3.4 mmol/L — ABNORMAL LOW (ref 3.5–5.1)
Sodium: 137 mmol/L (ref 135–145)
Total Bilirubin: 9.2 mg/dL — ABNORMAL HIGH (ref 0.3–1.2)
Total Protein: 5.7 g/dL — ABNORMAL LOW (ref 6.5–8.1)

## 2021-10-09 LAB — MAGNESIUM: Magnesium: 2 mg/dL (ref 1.7–2.4)

## 2021-10-09 NOTE — Progress Notes (Signed)
[facility] Gastroenterology Progress Note  Patrick Collier 81 y.o. 13-Mar-1941  CC:  Painless Jaundice   Subjective: Patient seen and examined at bedside. No new symptoms. Denies melena/hematochezia. Denies pain. Denies nausea/vomiting. Tolerating full diet well.  ROS : Review of Systems  Constitutional:  Negative for chills, fever and weight loss.  Gastrointestinal:  Negative for abdominal pain, blood in stool, constipation, diarrhea, heartburn, melena, nausea and vomiting.      Objective: Vital signs in last 24 hours: Vitals:   10/08/21 2025 10/09/21 0614  BP: 125/70 127/69  Pulse: (!) 48 (!) 42  Resp: 18 18  Temp: 99.1 F (37.3 C) 98.3 F (36.8 C)  SpO2: 98% 99%    Physical Exam:  General:  Alert, cooperative, no distress, jaundiced  Head:  Normocephalic, without obvious abnormality, atraumatic  Eyes:  icteric sclera, EOM's intact  Lungs:   Clear to auscultation bilaterally, respirations unlabored  Heart:  Regular rate and rhythm, S1, S2 normal  Abdomen:   Soft, non-tender, bowel sounds active all four quadrants,  no masses,     Lab Results: Recent Labs    10/08/21 0432 10/09/21 0349  NA 137 137  K 3.4* 3.4*  CL 104 103  CO2 27 28  GLUCOSE 135* 129*  BUN 13 12  CREATININE 0.82 0.85  CALCIUM 8.0* 7.9*  MG 2.1 2.0   Recent Labs    10/08/21 0432 10/09/21 0349  AST 151* 143*  ALT 120* 115*  ALKPHOS 463* 429*  BILITOT 8.8* 9.2*  PROT 5.8* 5.7*  ALBUMIN 2.2* 2.2*   Recent Labs    10/06/21 1910 10/07/21 0458 10/08/21 0432 10/09/21 0349  WBC 7.8   < > 7.4 7.6  NEUTROABS 6.2  --   --   --   HGB 13.8   < > 12.0* 11.6*  HCT 41.1   < > 35.1* 33.3*  MCV 98.6   < > 96.7 96.8  PLT 229   < > 185 195   < > = values in this interval not displayed.   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 - MRCP showed severe bile duct dilation with CBD up to 2cm with  abrupt cut off at the distal common bile duct - CT severe dilation of the intra and extrahepatic duct with a abrupt narrowing at the head of the pancreas with apparent hyperdense material within the common bile duct or surrounding area concerning for cholangiocarcinoma or CBD stricture versus choledocholithiasis.  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 - AST 143/ ALT 115/ Alk phos 429 - T. Bili 9.2 - CA 19-9: 129   Plan: LFTs continuing to trend upward. Will need EUS and ERCP with CBD stent placement. Will discuss timing with Dr. Stacie Glaze GI will follow  Garnette Scheuermann PA-C 10/09/2021, 9:05 AM  Contact #  [phone]

## 2021-10-09 NOTE — Progress Notes (Addendum)
  Progress Note Patient: Patrick Collier [id number] DOB: 1940-12-02 DOA: 10/06/2021  DOS: the patient was seen and examined on 10/09/2021  Brief hospital course: 81 year old male with a past medical history of arthritis, intracranial hemorrhage, chronic back pain.  Presents to the hospital with complaints of abnormal CT scan. Patient has been noticing jaundice for last 1 week and pale color stool for last few weeks.  Went to see PCP and had a CT scan of the abdomen.  There was some concern for mass on the pancreas and patient was sent to the ER for further work-up. Found to have dilated CBD with possible stricture/mass without any significant large pancreatic or liver mass. [facility] following. Patient will require EUS ERCP.  Assessment and Plan: Painless obstructive jaundice. Hyperbilirubinemia Concerning for malignancy. 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 There are also pancreatic body cyst. CA 19-9 appears to be 129.  Elevated.  I have discussed this finding with the patient. MRCP continues to show severe intra and extrahepatic dilation of the bile duct with abrupt cutoff at distal CBD concerning for stricture or obstructive tumor. Patient will require ERCP/EUS.  Currently scheduled for Wednesday.   Hypokalemia. Potassium level stable.  Monitor.   Elevated ammonia level. Does not appear to have any encephalopathy for now. Continue lactulose. Monitor.   Mild hyperglycemia. BG elevated.  Moderate aortic stenosis. Cardiology consulted for preop clearance. Will monitor recommendation.  Subjective: No abdominal pain.  No nausea no vomiting or no fever no chills.  Passing gas.  No diarrhea.  Physical Exam: Vitals:   10/08/21 1329 10/08/21 2025 10/09/21 0614 10/09/21 1353  BP: 132/73 125/70 127/69 130/66  Pulse: (!) 44 (!) 48 (!) 42 (!) 47  Resp: '14 18 18   '$ Temp:  (!) 97.5 F (36.4 C) 99.1 F (37.3 C) 98.3 F (36.8 C) 98.7 F (37.1 C)  TempSrc: Oral Oral Oral Oral  SpO2: 100% 98% 99% 99%  Weight:      Height:       General: Appear in mild distress; no visible Abnormal Neck Mass Or lumps, Conjunctiva normal, severe icterus Cardiovascular: S1 and S2 Present, no Murmur, Respiratory: good respiratory effort, Bilateral Air entry present and CTA, no Crackles, no wheezes Abdomen: Bowel Sound present, Non tender  Extremities:  no Pedal edema Neurology: alert and oriented to time, place, and person  Gait not checked due to patient safety concerns   Data Reviewed: I have Reviewed nursing notes, Vitals, and Lab results since pt's last encounter. Pertinent lab results CBC and CMP I have ordered test including CBC and CMP I have discussed pt's care plan and test results with GI and cardiology.   Family Communication: Wife at bedside  Disposition: Status is: Inpatient Remains inpatient appropriate because: Need ERCP inpatient  Author: Berle Mull, MD 10/09/2021 8:06 PM  Please look on [email or web address] to find out who is on call.

## 2021-10-09 NOTE — Care Management Important Message (Signed)
Important Message  Patient Details IM Letter given to the Patient. Name: COLEN ELTZROTH MRN: [id number] Date of Birth: 04/25/1940   Medicare Important Message Given:  Yes     Kerin Salen 10/09/2021, 2:06 PM

## 2021-10-09 NOTE — Progress Notes (Signed)
Transition of Care [facility]) Screening Note  Patient Details  Name: Patrick Collier Date of Birth: 23-Jan-1941  Transition of Care [facility]) CM/SW Contact:    Sherie Don, LCSW Phone Number: 10/09/2021, 10:13 AM  Transition of Care Department [facility]) has reviewed patient and no TOC needs have been identified at this time. We will continue to monitor patient advancement through interdisciplinary progression rounds. If new patient transition needs arise, please place a TOC consult.

## 2021-10-10 DIAGNOSIS — Z01810 Encounter for preprocedural cardiovascular examination: Secondary | ICD-10-CM | POA: Diagnosis not present

## 2021-10-10 DIAGNOSIS — I350 Nonrheumatic aortic (valve) stenosis: Secondary | ICD-10-CM

## 2021-10-10 DIAGNOSIS — R978 Other abnormal tumor markers: Secondary | ICD-10-CM | POA: Diagnosis present

## 2021-10-10 DIAGNOSIS — R21 Rash and other nonspecific skin eruption: Secondary | ICD-10-CM | POA: Diagnosis present

## 2021-10-10 DIAGNOSIS — K831 Obstruction of bile duct: Secondary | ICD-10-CM | POA: Diagnosis not present

## 2021-10-10 DIAGNOSIS — E7220 Disorder of urea cycle metabolism, unspecified: Secondary | ICD-10-CM | POA: Diagnosis present

## 2021-10-10 LAB — CBC
HCT: 34 % — ABNORMAL LOW (ref 39.0–52.0)
Hemoglobin: 12.2 g/dL — ABNORMAL LOW (ref 13.0–17.0)
MCH: 33.8 pg (ref 26.0–34.0)
MCHC: 35.9 g/dL (ref 30.0–36.0)
MCV: 94.2 fL (ref 80.0–100.0)
Platelets: 222 10*3/uL (ref 150–400)
RBC: 3.61 MIL/uL — ABNORMAL LOW (ref 4.22–5.81)
RDW: 15.9 % — ABNORMAL HIGH (ref 11.5–15.5)
WBC: 8.2 10*3/uL (ref 4.0–10.5)
nRBC: 0 % (ref 0.0–0.2)

## 2021-10-10 LAB — COMPREHENSIVE METABOLIC PANEL
ALT: 119 U/L — ABNORMAL HIGH (ref 0–44)
AST: 146 U/L — ABNORMAL HIGH (ref 15–41)
Albumin: 2.2 g/dL — ABNORMAL LOW (ref 3.5–5.0)
Alkaline Phosphatase: 458 U/L — ABNORMAL HIGH (ref 38–126)
Anion gap: 7 (ref 5–15)
BUN: 11 mg/dL (ref 8–23)
CO2: 25 mmol/L (ref 22–32)
Calcium: 8.1 mg/dL — ABNORMAL LOW (ref 8.9–10.3)
Chloride: 107 mmol/L (ref 98–111)
Creatinine, Ser: 0.71 mg/dL (ref 0.61–1.24)
GFR, Estimated: >60 mL/min (ref >60)
Glucose, Bld: 106 mg/dL — ABNORMAL HIGH (ref 70–99)
Potassium: 3.4 mmol/L — ABNORMAL LOW (ref 3.5–5.1)
Sodium: 139 mmol/L (ref 135–145)
Total Bilirubin: 9.9 mg/dL — ABNORMAL HIGH (ref 0.3–1.2)
Total Protein: 6 g/dL — ABNORMAL LOW (ref 6.5–8.1)

## 2021-10-10 LAB — MAGNESIUM: Magnesium: 2 mg/dL (ref 1.7–2.4)

## 2021-10-10 MED ORDER — HYDROCORTISONE 0.5 % EX CREA
TOPICAL_CREAM | Freq: Two times a day (BID) | CUTANEOUS | Status: DC
  Administered 2021-10-10: 1 via TOPICAL
  Filled 2021-10-10: qty 28.35

## 2021-10-10 MED ORDER — POTASSIUM CHLORIDE CRYS ER 20 MEQ PO TBCR
40.0000 meq | EXTENDED_RELEASE_TABLET | ORAL | Status: AC
  Administered 2021-10-10 (×2): 40 meq via ORAL
  Filled 2021-10-10 (×2): qty 2

## 2021-10-10 NOTE — Progress Notes (Signed)
  Progress Note Patient: Patrick Collier [id number] DOB: [birth year] DOA: 10/06/2021  DOS: the patient was seen and examined on 10/10/2021  Brief hospital course: 81 year old male with a past medical history of arthritis, intracranial hemorrhage, chronic back pain.  Presents to the hospital with complaints of abnormal CT scan. Patient has been noticing jaundice for last 1 week and pale color stool for last few weeks.  Went to see PCP and had a CT scan of the abdomen.  There was some concern for mass on the pancreas and patient was sent to the ER for further work-up. Found to have dilated CBD with possible stricture/mass without any significant large pancreatic or liver mass. [facility] following. Patient will require EUS ERCP.   Assessment and Plan: Painless obstructive jaundice. Hyperbilirubinemia Concerning for malignancy. 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 There are also pancreatic body cyst. CA 19-9 appears to be 129.  Elevated.  I have discussed this finding with the patient. MRCP continues to show severe intra and extrahepatic dilation of the bile duct with abrupt cutoff at distal CBD concerning for stricture or obstructive tumor. LFTs have plateaued for now. Patient will require ERCP/EUS.  Currently scheduled for Wednesday.  Hypokalemia. Replacing.   Elevated ammonia level. Does not appear to have any encephalopathy for now. Continue lactulose. Monitor.   Mild hyperglycemia. BG elevated.   Moderate aortic stenosis. Cardiology consulted for preop clearance. Will monitor recommendation.  Rash.  Pt has rash on his back that he identified in the hospital. It was not itching yesterday but now appears to have some itching. We will treat with hydrocortisone.  Most likely associated with moisture and linen.  Subjective: No nausea no vomiting.  No fever no chills.   No dizziness lightheadedness.  No abdominal pain.  No diarrhea.  Had rash in the back.  Currently me that he is itching.  Physical Exam: Vitals:   10/09/21 1353 10/09/21 2016 10/10/21 0604 10/10/21 1324  BP: 130/66 138/72 119/74 114/65  Pulse: (!) 47 (!) 42 (!) 47 (!) 43  Resp:  '18 20 16  '$ Temp: 98.7 F (37.1 C) 97.6 F (36.4 C) 98.6 F (37 C) 98.4 F (36.9 C)  TempSrc: Oral Oral Oral Oral  SpO2: 99% 100% 97% 97%  Weight:      Height:       General: Appear in mild distress; no visible Abnormal Neck Mass Or lumps, Conjunctiva normal Cardiovascular: S1 and S2 Present, no Murmur, Respiratory: good respiratory effort, Bilateral Air entry present and CTA, no Crackles, no wheezes Abdomen: Bowel Sound present, Non tender  Extremities: no Pedal edema Neurology: alert and oriented to time, place, and person  Gait not checked due to patient safety concerns    Data Reviewed: I have Reviewed nursing notes, Vitals, and Lab results since pt's last encounter. Pertinent lab results CBC and CMP I have ordered test including CBC and CMP I have reviewed the last note from gastroenterology,    Family Communication: family at bedside.  Disposition: Status is: Inpatient Remains inpatient appropriate because: ERCP scheduled for tomorrow.  Author: Berle Mull, MD 10/10/2021 7:37 PM  Please look on [email or web address] to find out who is on call.

## 2021-10-10 NOTE — Progress Notes (Addendum)
Progress Note  Primary GI: [facility]   Subjective  Chief Complaint: Painless jaundice, abnormal imaging  For full details please see [facility] notes. Patient presented with painless jaundice, had outside CT scan concerning for obstructing lesion, stricture MRCP showed severe bile duct dilation with CBD up to 2cm with abrupt cut off at the distal common bile duct. [facility] contacted for ERCP  Denies abdominal pain, nausea vomiting.  CTAB showed severe dilation of the intra and extrahepatic duct with a abrupt narrowing at the head of the pancreas with apparent hyperdense material within the common bile duct or surrounding area concerning for cholangiocarcinoma or CBD stricture versus choledocholithiasis.  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 MRCP showed severe biliary ductal dilatation of the intra and extrahepatic bile ducts with abrupt cut off distal common bile duct no obstructing mass seen.  Small cystic lesions pancreatic tail largest 10 mm recommend MRCP 2 years.   Objective   Vital signs in last 24 hours: Temp:  [97.6 F (36.4 C)-98.7 F (37.1 C)] 98.4 F (36.9 C) (06/20 1324) Pulse Rate:  [42-47] 43 (06/20 1324) Resp:  [16-20] 16 (06/20 1324) BP: (114-138)/(65-74) 114/65 (06/20 1324) SpO2:  [97 %-100 %] 97 % (06/20 1324) Last BM Date : 10/10/21 Last BM recorded by nurses in past 5 days Stool Type: Type 5 (Soft blobs with clear-cut edges) (10/10/2021 10:00 AM)  General: Well-nourished, jaundiced male in no acute distress Abdomen:  Soft, Obese AB, Active bowel sounds. No tenderness . Neurologic:  Alert and  oriented x4;  No focal deficits.  Psych:  Cooperative. Normal mood and affect.  Intake/Output from previous day: 06/19 0701 - 06/20 0700 In: 2605.2 [P.O.:1200; I.V.:1405.2] Out: 2155 [Urine:2155] Intake/Output this shift: Total I/O In: 120 [P.O.:120] Out: 0   Studies/Results: No results found.  Lab Results: Recent Labs    10/08/21 0432 10/09/21 0349  10/10/21 0434  WBC 7.4 7.6 8.2  HGB 12.0* 11.6* 12.2*  HCT 35.1* 33.3* 34.0*  PLT 185 195 222   BMET Recent Labs    10/08/21 0432 10/09/21 0349 10/10/21 0434  NA 137 137 139  K 3.4* 3.4* 3.4*  CL 104 103 107  CO2 '27 28 25  '$ GLUCOSE 135* 129* 106*  BUN '13 12 11  '$ CREATININE 0.82 0.85 0.71  CALCIUM 8.0* 7.9* 8.1*   LFT Recent Labs    10/10/21 0434  PROT 6.0*  ALBUMIN 2.2*  AST 146*  ALT 119*  ALKPHOS 458*  BILITOT 9.9*   PT/INR No results for input(s): "LABPROT", "INR" in the last 72 hours.    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 WBC 8.2 HGB 12.2 Platelets 222 AST 146 ALT 119  Alkphos 458 TBili 9.9 10/06/2021 INR 1.2  CA19-9 129 elevated Plan for EUS with [facility], [facility] consulted for ERCP with possible stent I thoroughly discussed procedure with the patient to include nature, alternatives, benefits, and risks (including but not limited to post ERCP pancreatitis, bleeding, infection, perforation, anesthesia/cardiac pulmonary complications).  Patient verbalized understanding and gave verbal consent to proceed with ERCP.  Moderate aortic stenosis Cardiology has seen the patient 10/10/2021 for cardiac clearance, patient is without symptoms, echocardiogram 04/2021 with moderate AS   LOS: 4 days   Vladimir Crofts  10/10/2021, 1:32 PM    Attending Physician Note   I have taken an interval history, reviewed  the chart and examined the patient. I agree with the APP's note, impression and recommendations with my edits. My additional impressions and recommendations are as follows.   *Biliary obstruction, painless jaundice. Imaging studies concerning for an obstructing distal CBD stricture or tumor.  *Small pancreatic tail cystic lesions  *Moderate AS  EUS (Dr. Paulita Fujita) and ERCP possible stent, possible sphincterotomy (Dr. Fuller Plan)  tomorrow. ERCP risks, benefits and alternatives discussed in detail with the patient and his wife at the bedside. Pancreatic tail cystic lesions per [facility].   Lucio Edward, MD [facility] See AMION, [facility], for our on call provider

## 2021-10-10 NOTE — Consult Note (Addendum)
Cardiology Consultation:   Patient ID: SALVADORE Collier MRN: [id number]; DOB: 08-07-1940  Admit date: 10/06/2021 Date of Consult: 10/10/2021  PCP:  London Pepper, MD   [facility] Providers Cardiologist:  Kirk Ruths, MD      Patient Profile:   Patrick Collier is a 81 y.o. male with a hx of moderate aortic stenosis, osteoarthritis, history of intra-axial hemorrhage, chronic back pain s/p multiple back surgeries who is being seen 10/10/2021 for preoperative evaluation at the request of Dr. Posey Pronto.  History of Present Illness:   Mr. Patrick Collier is an 81 year old male with above medical history who is followed by Dr. Stanford Breed. Per chart review, patient was referred to cardiology in 2018 for evaluation of a new murmur and preoperative evaluation prior to knee replacement. Echocardiogram on 04/19/2017 showed EF 55-60%, mild LVH, grade I diastolic dysfunction, mild aortic stenosis, mildly dilated aortic root (56m). Abdominal bruit was noted on exam, underwent ultrasound of the aorta which was negative for aneurysm.  Underwent a follow up echocardiogram on 05/13/2020 that showed mild-moderate aortic stenosis, grade I diastolic dysfunction, EF 681-82%   More recently, patient had an echocardiogram completed on 05/12/2021 that showed moderate aortic valve stenosis, mild dilation of the aorta (331m, EF 60-65% with normal diastolic parameters. Also showed moderate mitral calcification without stenosis. He was seen in clinic by Dr. CrStanford Breedn 06/22/21 and at that time did not have any dyspnea, chest pain, or syncope. Planned to follow up in 6 months to ensure his symptoms were stable.   Patient presented to the ED 6/16 complaining of jaundice since last Friday and abdominal pain. He was seen by his PCP, underwent CT scan that showed a mass on the head of his pancreas. Patient reported symptoms of unintentional weight loss, decreased appetite. GI was consulted, underwent MRCP that showed severe intra and  extrahepatic dilation of the bile duct with abrupt cutoff at distal CBD concerning for stricture of obstructive tumor. Planned to undergo ERCP/EUS currently scheduled for tomorrow. Cardiology was consulted for preoperative evaluation prior to the procedure.   On interview, patient denies developing any new cardiac concerns since being seen by Dr. CrStanford Breedbout 3 months ago. Denies any chest pain, dyspnea, orthopnea, ankle edema, syncope/near syncope. Patient continues to be very active, walks at least 2 miles a day but sometimes up to 4 miles. Denies any chest pain on exertion or sob on exertion.   Past Medical History:  Diagnosis Date   Arthritis    Back pain    Heart murmur    History of kidney stones    ICH (intracerebral hemorrhage) ([facility]    Past Surgical History:  Procedure Laterality Date   BACK SURGERY     INGUINAL HERNIA REPAIR     KNEE ARTHROPLASTY Right 05/16/2017   Procedure: RIGHT TOTAL KNEE ARTHROPLASTY WITH COMPUTER NAVIGATION;  Surgeon: SwRod CanMD;  Location: [facility];  Service: Orthopedics;  Laterality: Right;  Needs RNFA   NECK SURGERY     TONSILLECTOMY       Home Medications:  Prior to Admission medications   Medication Sig Start Date End Date Taking? Authorizing Provider  acetaminophen (TYLENOL) 650 MG CR tablet Take 500 mg by mouth every 8 (eight) hours as needed for pain.     [provider]    Inpatient Medications: Scheduled Meds:  enoxaparin (LOVENOX) injection  40 mg Subcutaneous Q24H   lactulose  10 g Oral Daily   Continuous Infusions:  dextrose 5% lactated ringers  50 mL/hr at 10/10/21 0533   PRN Meds: morphine injection, ondansetron **OR** ondansetron (ZOFRAN) IV  Allergies:   No Known Allergies  Social History:   Social History   Socioeconomic History   Marital status: Married    Spouse name: Not on file   Number of children: 3   Years of education: Not on file   Highest education level: Not on file  Occupational History     Comment: Retired  Tobacco Use   Smoking status: Former   Smokeless tobacco: Never  Scientific laboratory technician Use: Never used  Substance and Sexual Activity   Alcohol use: Yes    Comment: one beer per day   Drug use: No   Sexual activity: Not on file  Other Topics Concern   Not on file  Social History Narrative   Not on file   Social Determinants of Health   Financial Resource Strain: Not on file  Food Insecurity: Not on file  Transportation Needs: Not on file  Physical Activity: Not on file  Stress: Not on file  Social Connections: Not on file  Intimate Partner Violence: Not on file    Family History:    Family History  Problem Relation Age of Onset   Diabetes Mother      ROS:  Please see the history of present illness.   All other ROS reviewed and negative.     Physical Exam/Data:   Vitals:   10/09/21 0614 10/09/21 1353 10/09/21 2016 10/10/21 0604  BP: 127/69 130/66 138/72 119/74  Pulse: (!) 42 (!) 47 (!) 42 (!) 47  Resp: '18  18 20  '$ Temp: 98.3 F (36.8 C) 98.7 F (37.1 C) 97.6 F (36.4 C) 98.6 F (37 C)  TempSrc: Oral Oral Oral Oral  SpO2: 99% 99% 100% 97%  Weight:      Height:        Intake/Output Summary (Last 24 hours) at 10/10/2021 0742 Last data filed at 10/10/2021 0600 Gross per 24 hour  Intake 2605.15 ml  Output 2155 ml  Net 450.15 ml      10/07/2021    4:59 AM 06/22/2021    8:02 AM 04/06/2020    2:52 PM  Last 3 Weights  Weight (lbs) 174 lb 6.1 oz 187 lb 187 lb  Weight (kg) 79.1 kg 84.823 kg 84.823 kg     Body mass index is 26.51 kg/m.  General: Pleasant elderly gentleman sitting on the bed in no acute distress  HEENT: scleral icterus present, otherwise normal  Neck: no JVD Vascular: Radial pulses 2+ bilaterally Cardiac:  RRR, grade 3/6 murmur at RUSB  Lungs:  clear to auscultation bilaterally, no wheezing, rhonchi or rales  Abd: soft, nontender to light palpation  Ext: no edema Musculoskeletal:  No deformities, BUE and BLE strength  normal and equal Skin: warm and dry, jaundiced   Neuro:  CNs 2-12 intact, no focal abnormalities noted Psych:  Normal affect   EKG:  The EKG was personally reviewed and demonstrates:  NA Telemetry:  Telemetry was personally reviewed and demonstrates:  NA  Relevant CV Studies:  Echocardiogram 05/12/2021  1. Left ventricular ejection fraction, by estimation, is 60 to 65%. The  left ventricle has normal function. The left ventricle has no regional  wall motion abnormalities. Left ventricular diastolic parameters were  normal. The average left ventricular  global longitudinal strain is -28.1 %. The global longitudinal strain is  normal.   2. Right ventricular systolic function is normal. The right  ventricular  size is normal. There is normal pulmonary artery systolic pressure.   3. The mitral valve is degenerative. Trivial mitral valve regurgitation.  No evidence of mitral stenosis. Moderate mitral annular calcification.   4. Gradients have increased since 05/13/20 mean 19->21 mmHg peak 34-> 40.7  mmHg . The aortic valve is tricuspid. There is moderate calcification of  the aortic valve. There is moderate thickening of the aortic valve. Aortic  valve regurgitation is not  visualized. Moderate aortic valve stenosis.   5. Aortic dilatation noted. There is mild dilatation of the ascending  aorta, measuring 39 mm.   6. The inferior vena cava is normal in size with greater than 50%  respiratory variability, suggesting right atrial pressure of 3 mmHg.   Comparison(s): 05/13/20 EF 60-65%. Mild-moderate AS 50mHg mean PG, 331mg  peak PG.   Laboratory Data:  High Sensitivity Troponin:  No results for input(s): "TROPONINIHS" in the last 720 hours.   Chemistry Recent Labs  Lab 10/08/21 0432 10/09/21 0349 10/10/21 0434  NA 137 137 139  K 3.4* 3.4* 3.4*  CL 104 103 107  CO2 '27 28 25  '$ GLUCOSE 135* 129* 106*  BUN '13 12 11  '$ CREATININE 0.82 0.85 0.71  CALCIUM 8.0* 7.9* 8.1*  MG 2.1 2.0  2.0  GFRNONAA >60 >60 >60  ANIONGAP '6 6 7    '$ Recent Labs  Lab 10/08/21 0432 10/09/21 0349 10/10/21 0434  PROT 5.8* 5.7* 6.0*  ALBUMIN 2.2* 2.2* 2.2*  AST 151* 143* 146*  ALT 120* 115* 119*  ALKPHOS 463* 429* 458*  BILITOT 8.8* 9.2* 9.9*   Lipids No results for input(s): "CHOL", "TRIG", "HDL", "LABVLDL", "LDLCALC", "CHOLHDL" in the last 168 hours.  Hematology Recent Labs  Lab 10/08/21 0432 10/09/21 0349 10/10/21 0434  WBC 7.4 7.6 8.2  RBC 3.63* 3.44* 3.61*  HGB 12.0* 11.6* 12.2*  HCT 35.1* 33.3* 34.0*  MCV 96.7 96.8 94.2  MCH 33.1 33.7 33.8  MCHC 34.2 34.8 35.9  RDW 15.5 15.9* 15.9*  PLT 185 195 222   Thyroid No results for input(s): "TSH", "FREET4" in the last 168 hours.  BNPNo results for input(s): "BNP", "PROBNP" in the last 168 hours.  DDimer No results for input(s): "DDIMER" in the last 168 hours.   Radiology/Studies:  MR ABDOMEN MRCP W WO CONTAST  Result Date: 10/07/2021 CLINICAL DATA:  Biliary obstruction suspected. EXAM: MRI ABDOMEN WITHOUT AND WITH CONTRAST (INCLUDING MRCP) TECHNIQUE: Multiplanar multisequence MR imaging of the abdomen was performed both before and after the administration of intravenous contrast. Heavily T2-weighted images of the biliary and pancreatic ducts were obtained, and three-dimensional MRCP images were rendered by post processing. CONTRAST:  60m40mADAVIST GADOBUTROL 1 MMOL/ML IV SOLN COMPARISON:  None Available. FINDINGS: Lower chest: Trace bilateral pleural effusions. Hepatobiliary: No suspicious liver lesions. Severe biliary ductal dilation abdominal pain both the intrahepatic extrahepatic bile ducts, common bile duct measures up to 2.0 cm. Abrupt cutoff is seen at the distal common bile duct. No evidence of choledocholithiasis. No obstructing mass is seen although motion artifact on contrast-enhanced imaging somewhat limits evaluation gallbladder is filled with T2 hypointense and T1 hyperintense material, likely sludge or contrast material  related to prior exam. Pancreas: No pancreatic ductal dilation. Small cystic lesions of the pancreatic tail, largest measures 10 mm on series 13, image 16. Spleen:  Within normal limits in size and appearance. Adrenals/Urinary Tract: No masses identified. No evidence of hydronephrosis. Stomach/Bowel: Visualized portions within the abdomen are unremarkable. Vascular/Lymphatic: No pathologically enlarged lymph  nodes identified. No abdominal aortic aneurysm demonstrated. Other:  Trace perihepatic ascites. Musculoskeletal: No suspicious bone lesions identified. IMPRESSION: 1. Severe biliary ductal dilation of both the intra and extrahepatic bile ducts with abrupt cutoff seen at the distal common bile duct. Findings are concerning for stricture or obstructing tumor. No obstructing mass is seen, although motion artifact on contrast-enhanced imaging limits evaluation. Recommend correlation with ERCP. 2. Small cystic lesions of the pancreatic tail, largest measures 10 mm. Recommend follow up pre and post contrast MRI/MRCP or pancreatic protocol CT in 2 years. This recommendation follows ACR consensus guidelines: Management of Incidental Pancreatic Cysts: A White Paper of the ACR Incidental Findings Committee. J Am Coll Radiol 8119;14:782-956. 3. Trace perihepatic ascites and trace bilateral pleural effusions. Electronically Signed   By: Yetta Glassman M.D.   On: 10/07/2021 13:06   MR 3D Recon At Scanner  Result Date: 10/07/2021 CLINICAL DATA:  Biliary obstruction suspected. EXAM: MRI ABDOMEN WITHOUT AND WITH CONTRAST (INCLUDING MRCP) TECHNIQUE: Multiplanar multisequence MR imaging of the abdomen was performed both before and after the administration of intravenous contrast. Heavily T2-weighted images of the biliary and pancreatic ducts were obtained, and three-dimensional MRCP images were rendered by post processing. CONTRAST:  64m GADAVIST GADOBUTROL 1 MMOL/ML IV SOLN COMPARISON:  None Available. FINDINGS: Lower  chest: Trace bilateral pleural effusions. Hepatobiliary: No suspicious liver lesions. Severe biliary ductal dilation abdominal pain both the intrahepatic extrahepatic bile ducts, common bile duct measures up to 2.0 cm. Abrupt cutoff is seen at the distal common bile duct. No evidence of choledocholithiasis. No obstructing mass is seen although motion artifact on contrast-enhanced imaging somewhat limits evaluation gallbladder is filled with T2 hypointense and T1 hyperintense material, likely sludge or contrast material related to prior exam. Pancreas: No pancreatic ductal dilation. Small cystic lesions of the pancreatic tail, largest measures 10 mm on series 13, image 16. Spleen:  Within normal limits in size and appearance. Adrenals/Urinary Tract: No masses identified. No evidence of hydronephrosis. Stomach/Bowel: Visualized portions within the abdomen are unremarkable. Vascular/Lymphatic: No pathologically enlarged lymph nodes identified. No abdominal aortic aneurysm demonstrated. Other:  Trace perihepatic ascites. Musculoskeletal: No suspicious bone lesions identified. IMPRESSION: 1. Severe biliary ductal dilation of both the intra and extrahepatic bile ducts with abrupt cutoff seen at the distal common bile duct. Findings are concerning for stricture or obstructing tumor. No obstructing mass is seen, although motion artifact on contrast-enhanced imaging limits evaluation. Recommend correlation with ERCP. 2. Small cystic lesions of the pancreatic tail, largest measures 10 mm. Recommend follow up pre and post contrast MRI/MRCP or pancreatic protocol CT in 2 years. This recommendation follows ACR consensus guidelines: Management of Incidental Pancreatic Cysts: A White Paper of the ACR Incidental Findings Committee. J Am Coll Radiol 22130;86:578-469 3. Trace perihepatic ascites and trace bilateral pleural effusions. Electronically Signed   By: LYetta GlassmanM.D.   On: 10/07/2021 13:06     Assessment and Plan:    Preoperative evaluation  Moderate aortic stenosis  - Patient was noted to have mild AS in 2018.   - Most recent echocardiogram on 05/12/21 showed moderate AS with mean gradient of 21 mmHg and a peak of 40.7 mmHG  - Patient is followed in the outpatient setting by Dr. CStanford Breed- last seen in 06/2021. At this time, patient denied dyspnea, orthopnea, chest pain, or syncope. Plan was to follow up in 6 months to make sure symptoms were stable  - Now, patient is admitted with juandice, abdominal pain, unintentional weight loss, decreased appetite.  Seen by GI-- underwent MRCP that showed severe intra and extrahepatic dilation of the bile duct concerning for stricture of obstructive tumor. GI plans to proceed with ERCP on 6/21 - On interview, patient has not developed dyspnea, orthopnea, chest pain, syncope/near syncope  - Patient has good functional capacity for his age-- walks between 2-4 miles daily without exertional cp or SOB  - Will confirm with MD, but given that his last echocardiogram was approx 6 months ago and he has not developed any new symptoms, I do not suspect we will do further workup prior to procedures   MD to see.     Risk Assessment/Risk Scores:          For questions or updates, please contact [place] Please consult [email or web address] for contact info under    Signed, Margie Billet, PA-C  10/10/2021 7:42 AM  Personally seen and examined. Agree with above.  81 year old male awaiting ERCP/EUS on Wednesday with jaundice, mass on head of pancreas.  He has been quite active at home.  Works in the garden very vigorously.  Walks without difficulty.  No shortness of breath no chest pain no syncope.  He is able to complete greater than 4 METS of activity without difficulty.  Dr. Stanford Breed has been following his aortic stenosis for quite some time and most recently his valve parameters were moderate aortic stenosis.  Ejection fraction is normal at 60 to 65%.   Echocardiogram reviewed.  Had discussion with he, family member, wife.  I believe he may proceed with ERCP with low overall cardiac risk.  No further preoperative cardiac procedures warranted.  Please let us know if we can be of further assistance.  Candee Furbish, MD

## 2021-10-10 NOTE — Progress Notes (Signed)
[facility] Progress Note  Patrick Collier 81 y.o. 03/16/1941  CC: Painless jaundice   Subjective: Patient seen and examined at bedside.  Denies abdominal pain, nausea, vomiting.  ROS : Review of Systems  Constitutional:  Negative for chills, fever and malaise/fatigue.  Gastrointestinal:  Negative for abdominal pain, blood in stool, constipation, diarrhea, heartburn, melena, nausea and vomiting.      Objective: Vital signs in last 24 hours: Vitals:   10/09/21 2016 10/10/21 0604  BP: 138/72 119/74  Pulse: (!) 42 (!) 47  Resp: 18 20  Temp: 97.6 F (36.4 C) 98.6 F (37 C)  SpO2: 100% 97%    Physical Exam:  General:  Alert, cooperative, no distress, jaundiced  Head:  Normocephalic, without obvious abnormality, atraumatic  Eyes:  icteric sclera, EOM's intact  Lungs:   Clear to auscultation bilaterally, respirations unlabored  Heart:  Regular rate and rhythm, S1, S2 normal  Abdomen:   Soft, non-tender, bowel sounds active all four quadrants,  no masses,     Lab Results: Recent Labs    10/09/21 0349 10/10/21 0434  NA 137 139  K 3.4* 3.4*  CL 103 107  CO2 28 25  GLUCOSE 129* 106*  BUN 12 11  CREATININE 0.85 0.71  CALCIUM 7.9* 8.1*  MG 2.0 2.0   Recent Labs    10/09/21 0349 10/10/21 0434  AST 143* 146*  ALT 115* 119*  ALKPHOS 429* 458*  BILITOT 9.2* 9.9*  PROT 5.7* 6.0*  ALBUMIN 2.2* 2.2*   Recent Labs    10/09/21 0349 10/10/21 0434  WBC 7.6 8.2  HGB 11.6* 12.2*  HCT 33.3* 34.0*  MCV 96.8 94.2  PLT 195 222   No results for input(s): "LABPROT", "INR" in the last 72 hours.    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 - MRCP showed severe bile duct dilation with CBD up to 2cm with abrupt cut off at the distal common bile duct - CT severe dilation of the intra and extrahepatic duct with a abrupt narrowing at the head of the pancreas with apparent hyperdense material within the common bile duct or  surrounding area concerning for cholangiocarcinoma or CBD stricture versus choledocholithiasis.  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 - AST 146/ALT 119/alk phos 458, stable - T. Bili 9.9 (9.2) - CA 19-9: 129   Plan: EUS/ERCP tentatively planned for Wednesday around 1130 I thoroughly discussed procedure with the patient to include nature, alternatives, benefits, and risks (including but not limited to post ERCP pancreatitis, bleeding, infection, perforation, anesthesia/cardiac pulmonary complications).  Patient verbalized understanding and gave verbal consent to proceed with EUS/ERCP. Can continue soft diet.  N.p.o. after midnight for procedure tomorrow Continue to monitor LFTs [facility] will follow   M  PA-C 10/10/2021, 10:41 AM  Contact #  [phone]  

## 2021-10-11 ENCOUNTER — Encounter (HOSPITAL_COMMUNITY)
Admission: EM | Disposition: A | Discharge status: Home / Self Care | Payer: Self-pay | Source: Home / Self Care | Attending: Internal Medicine

## 2021-10-11 ENCOUNTER — Inpatient Hospital Stay (HOSPITAL_COMMUNITY): Payer: Medicare Other | Admitting: Certified Registered"

## 2021-10-11 ENCOUNTER — Encounter (HOSPITAL_COMMUNITY): Payer: Self-pay | Admitting: Internal Medicine

## 2021-10-11 ENCOUNTER — Inpatient Hospital Stay (HOSPITAL_COMMUNITY): Payer: Medicare Other

## 2021-10-11 DIAGNOSIS — E7220 Disorder of urea cycle metabolism, unspecified: Secondary | ICD-10-CM | POA: Diagnosis not present

## 2021-10-11 DIAGNOSIS — R933 Abnormal findings on diagnostic imaging of other parts of digestive tract: Secondary | ICD-10-CM

## 2021-10-11 DIAGNOSIS — R739 Hyperglycemia, unspecified: Secondary | ICD-10-CM

## 2021-10-11 DIAGNOSIS — R21 Rash and other nonspecific skin eruption: Secondary | ICD-10-CM

## 2021-10-11 DIAGNOSIS — E876 Hypokalemia: Secondary | ICD-10-CM

## 2021-10-11 DIAGNOSIS — M1711 Unilateral primary osteoarthritis, right knee: Secondary | ICD-10-CM

## 2021-10-11 DIAGNOSIS — K831 Obstruction of bile duct: Secondary | ICD-10-CM

## 2021-10-11 DIAGNOSIS — R978 Other abnormal tumor markers: Secondary | ICD-10-CM | POA: Diagnosis not present

## 2021-10-11 DIAGNOSIS — I350 Nonrheumatic aortic (valve) stenosis: Secondary | ICD-10-CM

## 2021-10-11 DIAGNOSIS — M1990 Unspecified osteoarthritis, unspecified site: Secondary | ICD-10-CM

## 2021-10-11 DIAGNOSIS — R001 Bradycardia, unspecified: Secondary | ICD-10-CM

## 2021-10-11 DIAGNOSIS — R17 Unspecified jaundice: Principal | ICD-10-CM

## 2021-10-11 DIAGNOSIS — K838 Other specified diseases of biliary tract: Secondary | ICD-10-CM

## 2021-10-11 LAB — COMPREHENSIVE METABOLIC PANEL
ALT: 114 U/L — ABNORMAL HIGH (ref 0–44)
AST: 149 U/L — ABNORMAL HIGH (ref 15–41)
Albumin: 2.1 g/dL — ABNORMAL LOW (ref 3.5–5.0)
Alkaline Phosphatase: 468 U/L — ABNORMAL HIGH (ref 38–126)
Anion gap: 6 (ref 5–15)
BUN: 10 mg/dL (ref 8–23)
CO2: 25 mmol/L (ref 22–32)
Calcium: 8 mg/dL — ABNORMAL LOW (ref 8.9–10.3)
Chloride: 106 mmol/L (ref 98–111)
Creatinine, Ser: 0.57 mg/dL — ABNORMAL LOW (ref 0.61–1.24)
GFR, Estimated: >60 mL/min (ref >60)
Glucose, Bld: 108 mg/dL — ABNORMAL HIGH (ref 70–99)
Potassium: 4.2 mmol/L (ref 3.5–5.1)
Sodium: 137 mmol/L (ref 135–145)
Total Bilirubin: 9.9 mg/dL — ABNORMAL HIGH (ref 0.3–1.2)
Total Protein: 6.2 g/dL — ABNORMAL LOW (ref 6.5–8.1)

## 2021-10-11 LAB — CBC
HCT: 33.5 % — ABNORMAL LOW (ref 39.0–52.0)
Hemoglobin: 11.8 g/dL — ABNORMAL LOW (ref 13.0–17.0)
MCH: 33.6 pg (ref 26.0–34.0)
MCHC: 35.2 g/dL (ref 30.0–36.0)
MCV: 95.4 fL (ref 80.0–100.0)
Platelets: 216 10*3/uL (ref 150–400)
RBC: 3.51 MIL/uL — ABNORMAL LOW (ref 4.22–5.81)
RDW: 16.1 % — ABNORMAL HIGH (ref 11.5–15.5)
WBC: 7.6 10*3/uL (ref 4.0–10.5)
nRBC: 0 % (ref 0.0–0.2)

## 2021-10-11 LAB — HEMOGLOBIN A1C
Hgb A1c MFr Bld: 5.4 % (ref 4.8–5.6)
Mean Plasma Glucose: 108.28 mg/dL

## 2021-10-11 LAB — AMMONIA: Ammonia: 56 umol/L — ABNORMAL HIGH (ref 9–35)

## 2021-10-11 LAB — PROTIME-INR
INR: 1.6 — ABNORMAL HIGH (ref 0.8–1.2)
Prothrombin Time: 18.6 seconds — ABNORMAL HIGH (ref 11.4–15.2)

## 2021-10-11 IMAGING — RF DG ERCP WO/W SPHINCTEROTOMY
1 series · 15 of 22 positions shown · non-contrast
Comparison: MR [DATE]

CLINICAL DATA: 81-year-old male with a history of biliary
obstruction

EXAM:
ERCP
TECHNIQUE: Multiple spot images obtained with the fluoroscopic device and
submitted for interpretation post-procedure.
FLUOROSCOPY:
Radiation Exposure Index (as provided by the fluoroscopic device):
73 mGy Kerma

[Series 1: run · 15 of 22 slices shown]
[im 1/22]
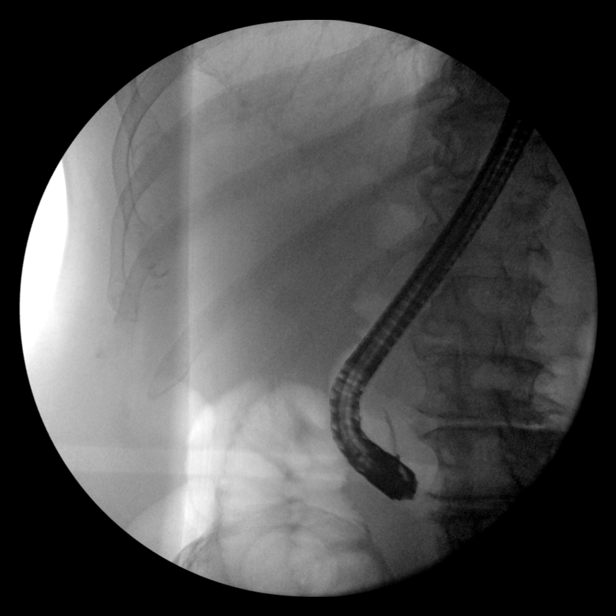
[im 3/22]
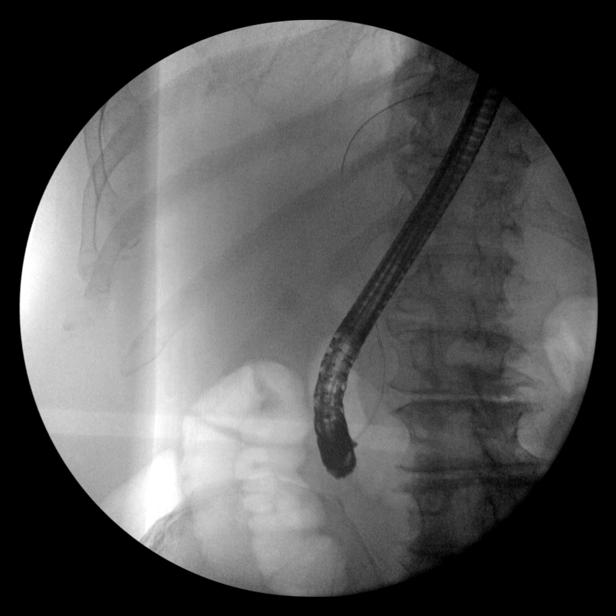
[im 4/22]
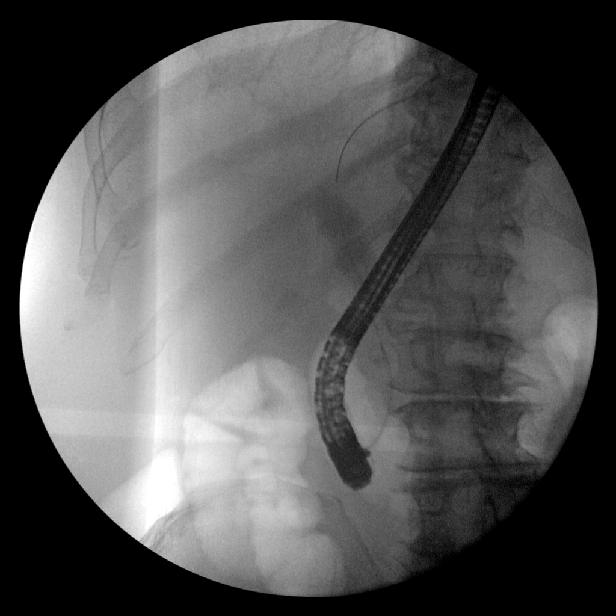
[im 6/22]
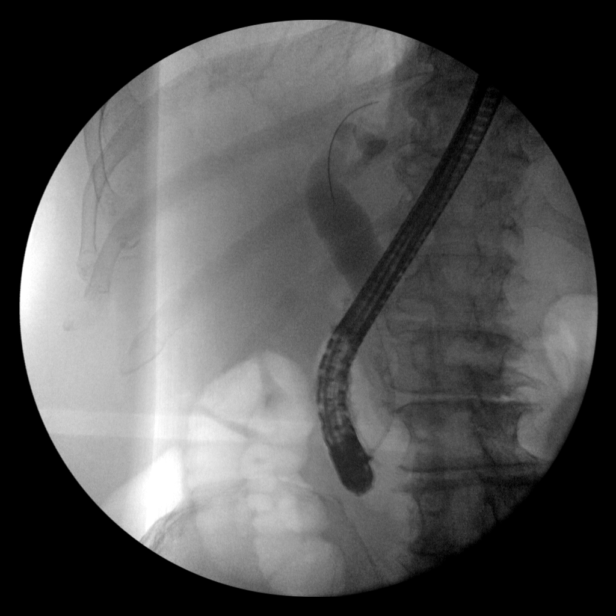
[im 7/22]
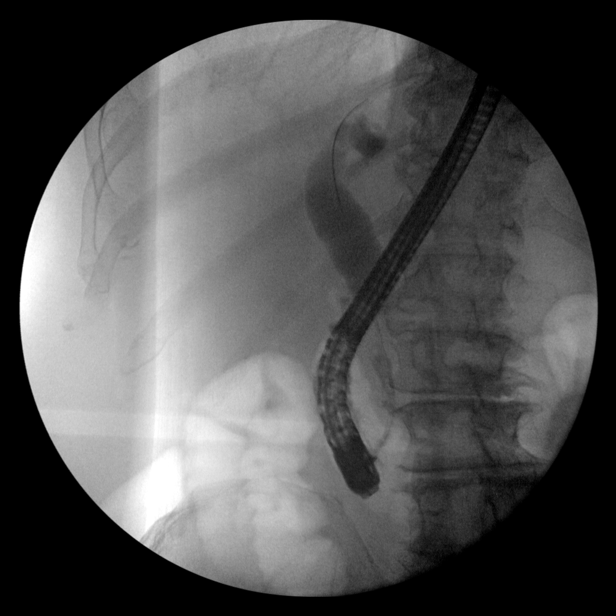
[im 9/22]
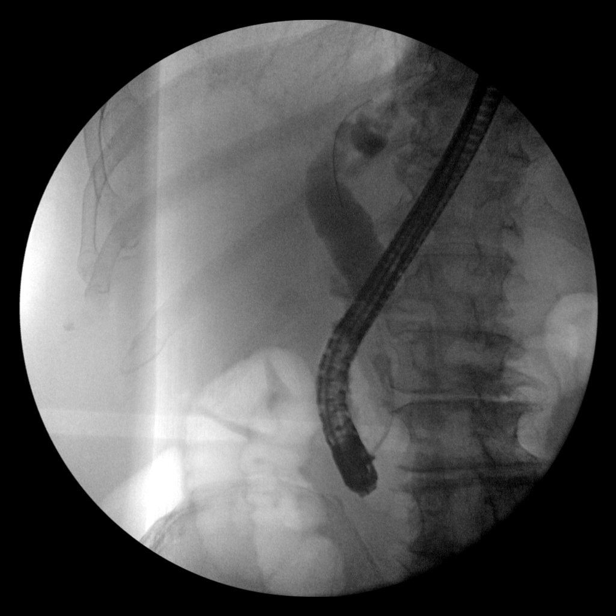
[im 10/22]
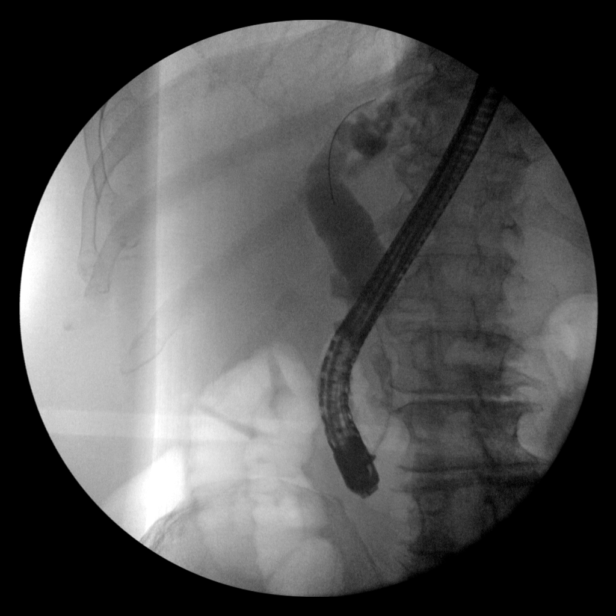
[im 12/22]
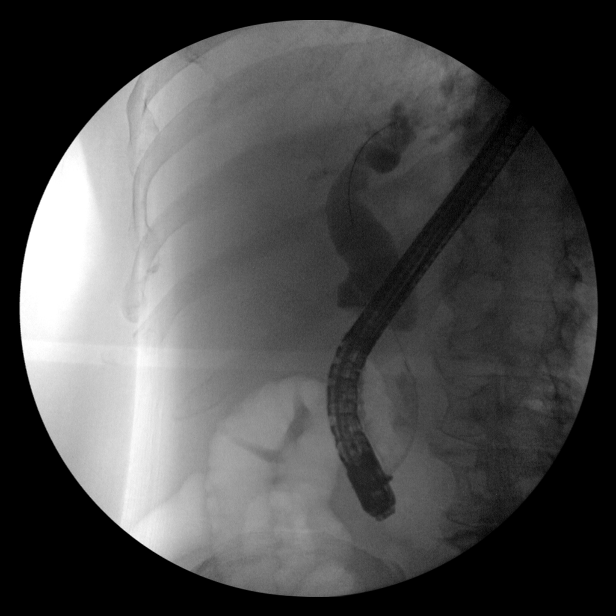
[im 13/22]
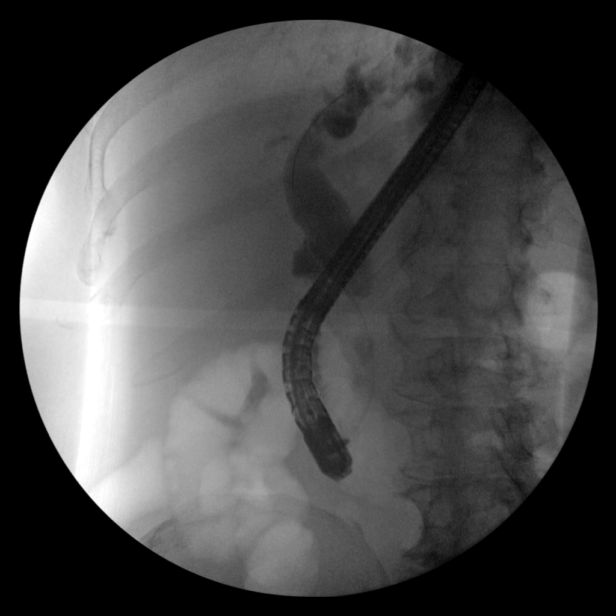
[im 14/22]
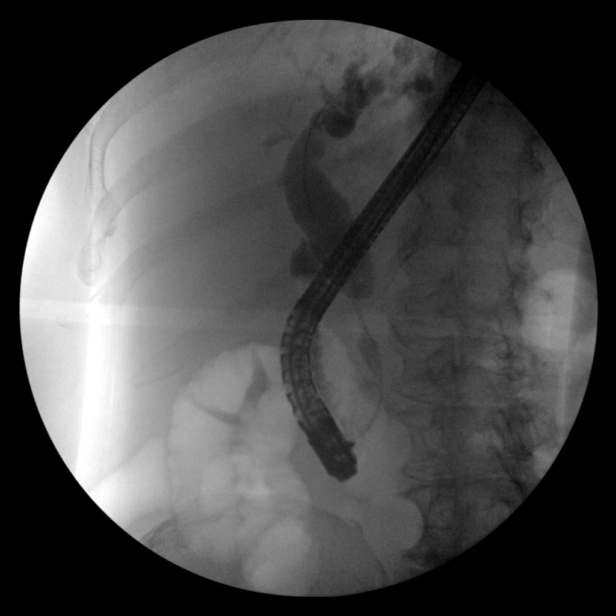
[im 16/22]
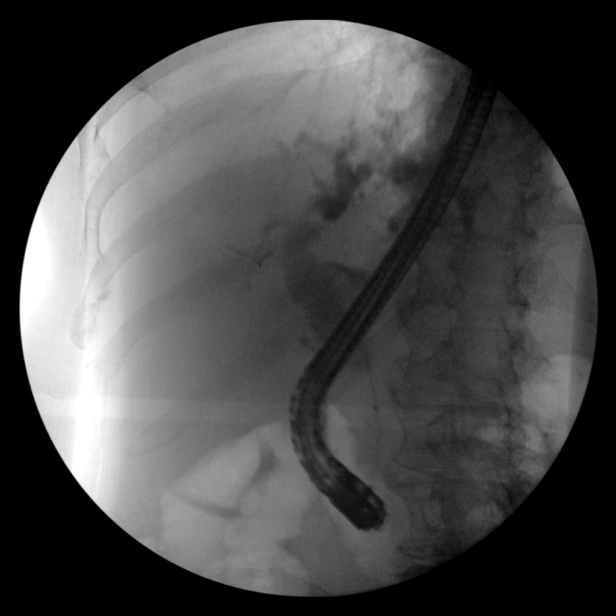
[im 17/22]
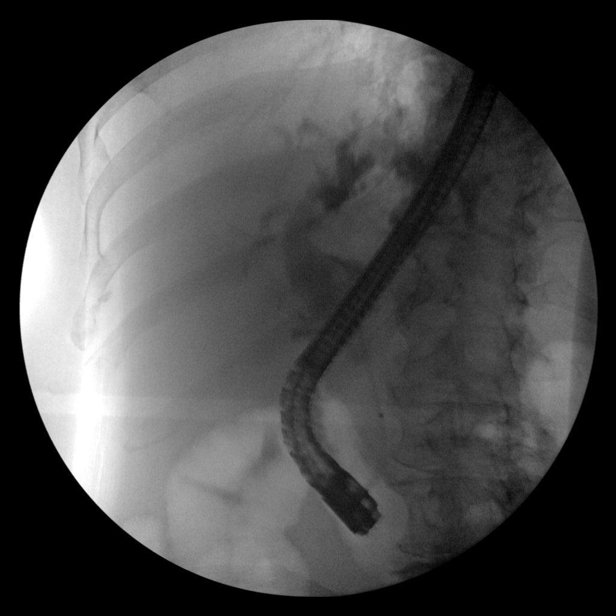
[im 19/22]
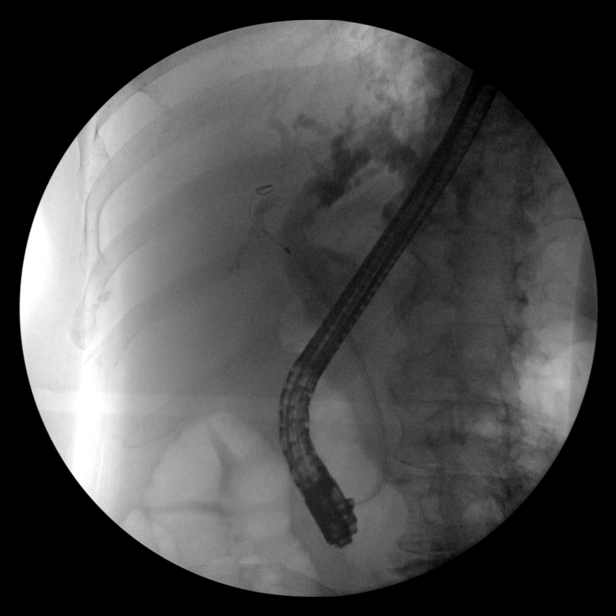
[im 20/22]
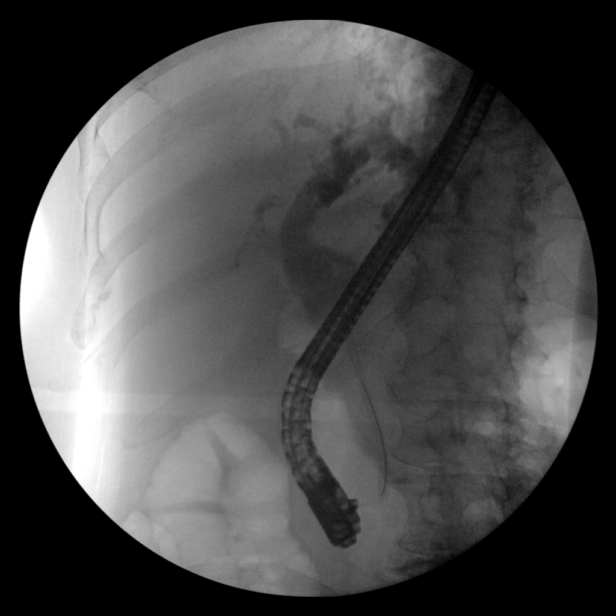
[im 22/22]
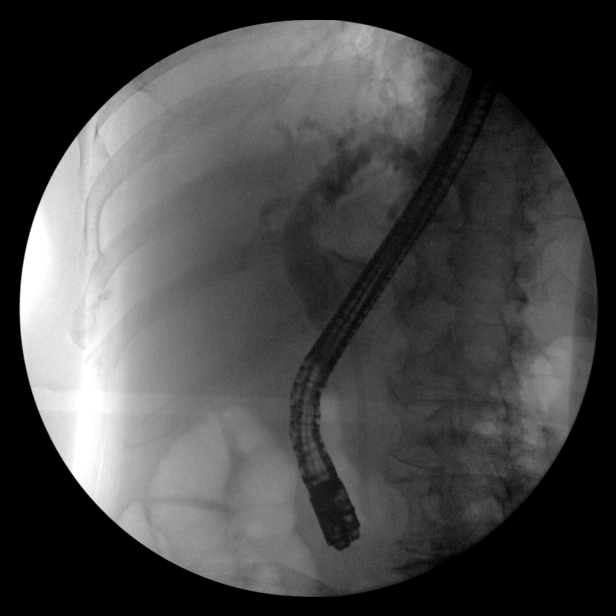

[15 of 22 positions shown; findings below may reference images not displayed]

FINDINGS: Limited intraoperative fluoroscopic spot images during ERCP.

Initial image demonstrates endoscope projecting over the upper
abdomen. Subsequently there is placement of a safety wire and
retrograde infusion of contrast, partially opacifying the
extrahepatic and intrahepatic biliary ducts. Extrahepatic ducts and
the visualized intrahepatic ducts are dilated.

Subsequent placement of a plastic biliary stent.
IMPRESSION: Limited images during ERCP demonstrates dilated biliary ducts and
placement of plastic biliary stent. Please refer to the dictated
operative report for full details of intraoperative findings and
procedure.

## 2021-10-11 SURGERY — UPPER ENDOSCOPIC ULTRASOUND (EUS) LINEAR
Anesthesia: General

## 2021-10-11 SURGERY — ENDOSCOPIC RETROGRADE CHOLANGIOPANCREATOGRAPHY (ERCP) WITH PROPOFOL
Anesthesia: General

## 2021-10-11 MED ORDER — ROCURONIUM BROMIDE 100 MG/10ML IV SOLN
INTRAVENOUS | Status: DC | PRN
  Administered 2021-10-11: 50 mg via INTRAVENOUS
  Administered 2021-10-11: 15 mg via INTRAVENOUS
  Administered 2021-10-11: 20 mg via INTRAVENOUS

## 2021-10-11 MED ORDER — DICLOFENAC SUPPOSITORY 100 MG
RECTAL | Status: AC
  Filled 2021-10-11: qty 1

## 2021-10-11 MED ORDER — FENTANYL CITRATE (PF) 100 MCG/2ML IJ SOLN
INTRAMUSCULAR | Status: AC
  Filled 2021-10-11: qty 2

## 2021-10-11 MED ORDER — SUGAMMADEX SODIUM 200 MG/2ML IV SOLN
INTRAVENOUS | Status: DC | PRN
  Administered 2021-10-11: 200 mg via INTRAVENOUS

## 2021-10-11 MED ORDER — PROPOFOL 10 MG/ML IV BOLUS
INTRAVENOUS | Status: AC
  Filled 2021-10-11: qty 40

## 2021-10-11 MED ORDER — GLUCAGON HCL RDNA (DIAGNOSTIC) 1 MG IJ SOLR
INTRAMUSCULAR | Status: DC | PRN
  Administered 2021-10-11 (×2): .25 mg via INTRAVENOUS

## 2021-10-11 MED ORDER — PROPOFOL 10 MG/ML IV BOLUS
INTRAVENOUS | Status: DC | PRN
  Administered 2021-10-11: 100 mg via INTRAVENOUS

## 2021-10-11 MED ORDER — LACTATED RINGERS IV SOLN: INTRAVENOUS | Status: DC

## 2021-10-11 MED ORDER — LIDOCAINE HCL (CARDIAC) PF 100 MG/5ML IV SOSY
PREFILLED_SYRINGE | INTRAVENOUS | Status: DC | PRN
  Administered 2021-10-11: 40 mg via INTRAVENOUS

## 2021-10-11 MED ORDER — GLUCAGON HCL RDNA (DIAGNOSTIC) 1 MG IJ SOLR
INTRAMUSCULAR | Status: AC
  Filled 2021-10-11: qty 1

## 2021-10-11 MED ORDER — DICLOFENAC SUPPOSITORY 100 MG
RECTAL | Status: DC | PRN
  Administered 2021-10-11: 100 mg via RECTAL

## 2021-10-11 MED ORDER — DEXAMETHASONE SODIUM PHOSPHATE 10 MG/ML IJ SOLN
INTRAMUSCULAR | Status: DC | PRN
  Administered 2021-10-11: 10 mg via INTRAVENOUS

## 2021-10-11 MED ORDER — CIPROFLOXACIN IN D5W 400 MG/200ML IV SOLN
INTRAVENOUS | Status: AC
  Filled 2021-10-11: qty 200

## 2021-10-11 MED ORDER — GLYCOPYRROLATE 0.2 MG/ML IJ SOLN
INTRAMUSCULAR | Status: DC | PRN
  Administered 2021-10-11: .2 mg via INTRAVENOUS

## 2021-10-11 MED ORDER — SODIUM CHLORIDE 0.9 % IV SOLN
INTRAVENOUS | Status: DC | PRN
  Administered 2021-10-11: 50 mL

## 2021-10-11 MED ORDER — PHENYLEPHRINE HCL (PRESSORS) 10 MG/ML IV SOLN
INTRAVENOUS | Status: DC | PRN
  Administered 2021-10-11: 160 ug via INTRAVENOUS
  Administered 2021-10-11: 80 ug via INTRAVENOUS

## 2021-10-11 NOTE — Interval H&P Note (Signed)
History and Physical Interval Note:  10/11/2021 10:35 AM  Patrick Collier  has presented today for surgery, with the diagnosis of painless jaudice, elevated LFTs, abnormal CT/MRCP scan.  The various methods of treatment have been discussed with the patient and family. After consideration of risks, benefits and other options for treatment, the patient has consented to  Procedure(s): UPPER ENDOSCOPIC ULTRASOUND (EUS) LINEAR (N/A) ENDOSCOPIC RETROGRADE CHOLANGIOPANCREATOGRAPHY (ERCP) (N/A) as a surgical intervention.  The patient's history has been reviewed, patient examined, no change in status, stable for surgery.  I have reviewed the patient's chart and labs.  Questions were answered to the patient's satisfaction.     Landry Dyke

## 2021-10-11 NOTE — Progress Notes (Signed)
PROGRESS NOTE  Patrick Collier ELF:[id number] DOB: [birth year]   PCP: London Pepper, MD  Patient is from: Home.  Lives with his wife.  Independently ambulates at baseline.  DOA: 10/06/2021 LOS: 5  Chief complaints Chief Complaint  Patient presents with   Jaundice     Brief Narrative / Interim history: 81 year old M with PMH of ICH, chronic back pain, osteoarthritis, bradycardia and heart murmur directed to ED by PCP due to abnormal CT scan after he presented to PCP with jaundice and pale stool for about a week.  Liver enzymes, bilirubin and ALP elevated.  CT abdomen and pelvis showed evidence of severe intra and extrahepatic dilation with narrowing at the head of the pancreas, pancreatic body cyst.  CA 19-9 elevated to 129.  MRCP confirms severe intra and extrahepatic dilation of the bile duct with abrupt cut off at the distal CBD concerning for stricture or obstructive tumor.  Plan for EUS/ERCP by GI  Subjective: Seen and examined earlier this morning before he went down for EUS/ERCP.  No major events overnight of this morning.  No complaints.  Patient's wife at bedside.  Objective: Vitals:   10/10/21 1324 10/10/21 2035 10/11/21 0552 10/11/21 1024  BP: 114/65 133/75 128/74 135/67  Pulse: (!) 43 (!) 48 (!) 48   Resp: '16 17 17 12  '$ Temp: 98.4 F (36.9 C) 99 F (37.2 C) 98.6 F (37 C) 98.2 F (36.8 C)  TempSrc: Oral Oral Oral Oral  SpO2: 97% 100% 100% 98%  Weight:      Height:        Examination:  GENERAL: No apparent distress.  Nontoxic. HEENT: MMM.  Sclera icteric. NECK: Supple.  No apparent JVD.  RESP:  No IWOB.  Fair aeration bilaterally. CVS:  RRR. Heart sounds normal.  ABD/GI/GU: BS+. Abd soft, NTND.  MSK/EXT:  Moves extremities. No apparent deformity. No edema.  SKIN: Skin jaundice. NEURO: Awake, alert and oriented appropriately.  No apparent focal neuro deficit. PSYCH: Calm. Normal affect.   Procedures:  6/21-EUS and ERCP planned  Microbiology  summarized: None  Assessment and plan: Principal Problem:   Obstructive jaundice Active Problems:   Hyperbilirubinemia   Elevated CA 19-9 level   Hyperammonemia (HCC)   Moderate aortic stenosis   Lower back rash   Hyperglycemia   Hypokalemia   Osteoarthritis of right knee   Sinus bradycardia  Painless obstructive jaundice Elevated liver enzymes/hyperbilirubinemia/alkaline phosphatase-relatively stable. Coagulopathy Elevated CA 19-9 level Recent Labs  Lab 10/07/21 0458 10/08/21 0432 10/09/21 0349 10/10/21 0434 10/11/21 0505  AST 188* 151* 143* 146* 149*  ALT 135* 120* 115* 119* 114*  ALKPHOS 508* 463* 429* 458* 468*  BILITOT 9.9* 8.8* 9.2* 9.9* 9.9*  PROT 6.5 5.8* 5.7* 6.0* 6.2*  ALBUMIN 2.5* 2.2* 2.2* 2.2* 2.1*  CT abdomen and pelvis and MRCP as above.  CA 19-9 elevated to 129. -Plan for EUS/ERCP today  Normocytic anemia: Stable. Recent Labs    10/06/21 1910 10/07/21 0458 10/08/21 0432 10/09/21 0349 10/10/21 0434 10/11/21 0505  HGB 13.8 12.5* 12.0* 11.6* 12.2* 11.8*  -Continue monitoring  Hyperammonia: Not encephalopathic. -New lactulose and monitor  Hyperglycemia: A1c 5.4%.  Hypokalemia: Resolved.  Heart murmur/history of moderate aortic stenosis: Not symptomatic. -Outpatient follow-up  Sinus bradycardia: Not symptomatic.  Not on nodal blocking agents  Skin rash/contact dermatitis -Improved with hydrocortisone cream     DVT prophylaxis:  enoxaparin (LOVENOX) injection 40 mg Start: 10/07/21 1000  Code Status: Full code Family Communication: Updated patient's wife at bedside Level  of care: Med-Surg Status is: Inpatient Remains inpatient appropriate because: Obstructive jaundice and elevated liver enzymes requiring further evaluation   Final disposition: Likely home once medically cleared Consultants:  Gastroenterology  Sch Meds:  Scheduled Meds:  [MAR Hold] enoxaparin (LOVENOX) injection  40 mg Subcutaneous Q24H   [MAR Hold]  hydrocortisone cream   Topical BID   [MAR Hold] lactulose  10 g Oral Daily   Continuous Infusions:  dextrose 5% lactated ringers 50 mL/hr at 10/10/21 0533   lactated ringers 10 mL/hr at 10/11/21 1033   PRN Meds:.[MAR Hold]  morphine injection, [MAR Hold] ondansetron **OR** [MAR Hold] ondansetron (ZOFRAN) IV  Antimicrobials: Anti-infectives (From admission, onward)    None        I have personally reviewed the following labs and images: CBC: Recent Labs  Lab 10/06/21 1910 10/07/21 0458 10/08/21 0432 10/09/21 0349 10/10/21 0434 10/11/21 0505  WBC 7.8 7.7 7.4 7.6 8.2 7.6  NEUTROABS 6.2  --   --   --   --   --   HGB 13.8 12.5* 12.0* 11.6* 12.2* 11.8*  HCT 41.1 36.1* 35.1* 33.3* 34.0* 33.5*  MCV 98.6 96.0 96.7 96.8 94.2 95.4  PLT 229 198 185 195 222 216   BMP &GFR Recent Labs  Lab 10/07/21 0458 10/08/21 0432 10/09/21 0349 10/10/21 0434 10/11/21 0505  NA 136 137 137 139 137  K 3.1* 3.4* 3.4* 3.4* 4.2  CL 103 104 103 107 106  CO2 '27 27 28 25 25  '$ GLUCOSE 136* 135* 129* 106* 108*  BUN '12 13 12 11 10  '$ CREATININE 0.87 0.82 0.85 0.71 0.57*  CALCIUM 8.3* 8.0* 7.9* 8.1* 8.0*  MG  --  2.1 2.0 2.0  --    Estimated Creatinine Clearance: 70.1 mL/min (A) (by C-G formula based on SCr of 0.57 mg/dL (L)). Liver & Pancreas: Recent Labs  Lab 10/07/21 0458 10/08/21 0432 10/09/21 0349 10/10/21 0434 10/11/21 0505  AST 188* 151* 143* 146* 149*  ALT 135* 120* 115* 119* 114*  ALKPHOS 508* 463* 429* 458* 468*  BILITOT 9.9* 8.8* 9.2* 9.9* 9.9*  PROT 6.5 5.8* 5.7* 6.0* 6.2*  ALBUMIN 2.5* 2.2* 2.2* 2.2* 2.1*   Recent Labs  Lab 10/06/21 1910  LIPASE 38   Recent Labs  Lab 10/06/21 2008 10/11/21 0505  AMMONIA 60* 56*   Diabetic: Recent Labs    10/11/21 0505  HGBA1C 5.4   No results for input(s): "GLUCAP" in the last 168 hours. Cardiac Enzymes: No results for input(s): "CKTOTAL", "CKMB", "CKMBINDEX", "TROPONINI" in the last 168 hours. No results for input(s):  "PROBNP" in the last 8760 hours. Coagulation Profile: Recent Labs  Lab 10/06/21 2008 10/11/21 0505  INR 1.2 1.6*   Thyroid Function Tests: No results for input(s): "TSH", "T4TOTAL", "FREET4", "T3FREE", "THYROIDAB" in the last 72 hours. Lipid Profile: No results for input(s): "CHOL", "HDL", "LDLCALC", "TRIG", "CHOLHDL", "LDLDIRECT" in the last 72 hours. Anemia Panel: No results for input(s): "VITAMINB12", "FOLATE", "FERRITIN", "TIBC", "IRON", "RETICCTPCT" in the last 72 hours. Urine analysis:    Component Value Date/Time   COLORURINE AMBER (A) 10/06/2021 1944   APPEARANCEUR CLEAR 10/06/2021 1944   LABSPEC >1.046 (H) 10/06/2021 1944   PHURINE 5.0 10/06/2021 1944   GLUCOSEU NEGATIVE 10/06/2021 1944   HGBUR SMALL (A) 10/06/2021 1944   BILIRUBINUR MODERATE (A) 10/06/2021 [place] NEGATIVE 10/06/2021 1944   PROTEINUR 30 (A) 10/06/2021 1944   NITRITE NEGATIVE 10/06/2021 1944   LEUKOCYTESUR NEGATIVE 10/06/2021 1944   Sepsis Labs: Invalid input(s): "  PROCALCITONIN", "LACTICIDVEN"  Microbiology: No results found for this or any previous visit (from the past 240 hour(s)).  Radiology Studies: No results found.    Lakeria Starkman T. [place]  If 7PM-7AM, please contact night-coverage [email or web address] 10/11/2021, 11:46 AM

## 2021-10-11 NOTE — Op Note (Signed)
[facility] Patient Name: Patrick Collier Procedure Date: 10/11/2021 MRN: [id number] Attending MD: Arta Silence , MD Date of Birth: 10-Oct-1940 CSN: [id number] Age: 81 Admit Type: Outpatient Procedure:                Upper EUS Indications:              Common bile duct dilation (acquired) seen on CT                            scan, obstructive jaundice Providers:                Arta Silence, MD, Jeanella Cara, RN,                            Johnte Portnoy Dalton, Technician Referring MD:             [facility] Medicines:                General Anesthesia Complications:            No immediate complications. Estimated Blood Loss:     Estimated blood loss: none. Procedure:                Pre-Anesthesia Assessment:                           - Prior to the procedure, a History and Physical                            was performed, and patient medications and                            allergies were reviewed. The patient's tolerance of                            previous anesthesia was also reviewed. The risks                            and benefits of the procedure and the sedation                            options and risks were discussed with the patient.                            All questions were answered, and informed consent                            was obtained. Prior Anticoagulants: The patient has                            taken no previous anticoagulant or antiplatelet                            agents. ASA Grade Assessment: III - A patient with  severe systemic disease. After reviewing the risks                            and benefits, the patient was deemed in                            satisfactory condition to undergo the procedure.                           After obtaining informed consent, the endoscope was                            passed under direct vision. Throughout the                            procedure,  the patient's blood pressure, pulse, and                            oxygen saturations were monitored continuously. The                            [id number] ([id number]) Olympus linear ultrasound scope                            was introduced through the mouth, and advanced to                            the second part of duodenum. The upper EUS was                            accomplished without difficulty. The patient                            tolerated the procedure well. Scope In: Scope Out: Findings:      ENDOSONOGRAPHIC FINDING: :      There was no sign of significant endosonographic abnormality in the       ampulla.      An irregular hypoechoic mass was identified endosonographically in the       common bile duct. The mass measured 6 mm by 6 mm in maximal       cross-sectional diameter. The outer margins were poorly-defined. Fine       needle aspiration for cytology was performed. Color Doppler imaging was       utilized prior to needle puncture to confirm a lack of significant       vascular structures within the needle path. Three passes were made with       the 25 gauge needle using a transduodenal approach. A stylet was used. A       cytologist was present and performed a preliminary cytologic       examination. The cellularity of the specimen was adequate. Final       cytology results are pending.      There was no sign of significant endosonographic abnormality in the       pancreatic head, genu of the pancreas and pancreatic body. No masses.  A few abnormal lymph nodes were visualized in the peripancreatic region       and porta hepatis region. The nodes were irregular, hypoechoic and had       poorly defined margins.      There was dilation in the middle third of the main bile duct, in the       upper third of the main bile duct and in the common hepatic duct which       measured up to 15 mm.      Mild perihepatic ascites. Impression:               - There was no  sign of significant pathology in the                            ampulla.                           - A mass was found in the common bile duct. Fine                            needle aspiration performed.                           - There was no sign of significant pathology in the                            pancreatic head, genu of the pancreas and                            pancreatic body.                           - A few abnormal lymph nodes were visualized in the                            peripancreatic region and porta hepatis region.                           - There was dilation in the middle third of the                            main bile duct, in the upper third of the main bile                            duct and in the common hepatic duct which measured                            up to 15 mm.                           - Small perihepatic ascites.                           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 Moderate Sedation:      Not Applicable - Patient had care per Anesthesia. Recommendation:           - Perform an ERCP today.                           - Continue present medications.                           - Await cytology results.                           [facility] will follow. Procedure Code(s):        --- Professional ---                           [phone], Esophagogastroduodenoscopy, flexible,                            transoral; with transendoscopic ultrasound-guided                            intramural or transmural fine needle                            aspiration/biopsy(s), (includes endoscopic                            ultrasound examination limited to the esophagus,                            stomach or duodenum, and adjacent structures) Diagnosis Code(s):        --- Professional ---                           K83.8, Other specified diseases of biliary tract                            I89.9, Noninfective disorder of lymphatic vessels                            and lymph nodes, unspecified CPT copyright 2019 American Medical Association. All rights reserved. The codes documented in this report are preliminary and upon coder review may  be revised to meet current compliance requirements. Arta Silence, MD 10/11/2021 12:48:56 PM This report has been signed electronically. Number of Addenda: 0

## 2021-10-11 NOTE — Anesthesia Preprocedure Evaluation (Addendum)
Anesthesia Evaluation  Patient identified by MRN, date of birth, ID band Patient awake    Reviewed: Allergy & Precautions, NPO status , Patient's Chart, lab work & pertinent test results  Airway Mallampati: II  TM Distance: >3 FB Neck ROM: Limited    Dental  (+) Teeth Intact, Dental Advisory Given, Caps   Pulmonary former smoker,    breath sounds clear to auscultation       Cardiovascular  Rhythm:Regular Rate:Bradycardia  Echo: 1. Left ventricular ejection fraction, by estimation, is 60 to 65%. The  left ventricle has normal function. The left ventricle has no regional  wall motion abnormalities. Left ventricular diastolic parameters were  normal. The average left ventricular  global longitudinal strain is -28.1 %. The global longitudinal strain is  normal.  2. Right ventricular systolic function is normal. The right ventricular  size is normal. There is normal pulmonary artery systolic pressure.  3. The mitral valve is degenerative. Trivial mitral valve regurgitation.  No evidence of mitral stenosis. Moderate mitral annular calcification.  4. Gradients have increased since 05/13/20 mean 19->21 mmHg peak 34-> 40.7  mmHg . The aortic valve is tricuspid. There is moderate calcification of  the aortic valve. There is moderate thickening of the aortic valve. Aortic  valve regurgitation is not  visualized. Moderate aortic valve stenosis.  5. Aortic dilatation noted. There is mild dilatation of the ascending  aorta, measuring 39 mm.  6. The inferior vena cava is normal in size with greater than 50%  respiratory variability, suggesting right atrial pressure of 3 mmHg.    Neuro/Psych    GI/Hepatic negative GI ROS, Neg liver ROS,   Endo/Other  negative endocrine ROS  Renal/GU negative Renal ROS     Musculoskeletal  (+) Arthritis ,   Abdominal Normal abdominal exam  (+)   Peds  Hematology negative hematology ROS (+)    Anesthesia Other Findings   Reproductive/Obstetrics                            Anesthesia Physical Anesthesia Plan  ASA: 3  Anesthesia Plan: General   Post-op Pain Management:    Induction: Intravenous  PONV Risk Score and Plan: 2 and Ondansetron and Treatment may vary due to age or medical condition  Airway Management Planned: Oral ETT  Additional Equipment: None  Intra-op Plan:   Post-operative Plan: Extubation in OR  Informed Consent: I have reviewed the patients History and Physical, chart, labs and discussed the procedure including the risks, benefits and alternatives for the proposed anesthesia with the patient or authorized representative who has indicated his/her understanding and acceptance.     Dental advisory given  Plan Discussed with: CRNA  Anesthesia Plan Comments:        Anesthesia Quick Evaluation

## 2021-10-11 NOTE — Op Note (Signed)
[facility] Patient Name: Patrick Collier Procedure Date: 10/11/2021 MRN: [id number] Attending MD: Ladene Artist , MD Date of Birth: 1940/11/16 CSN: [id number] Age: 81 Admit Type: Outpatient Procedure:                ERCP Indications:              Abnormal MRCP, Jaundice, Biliary dilation Providers:                Pricilla Riffle. Fuller Plan, MD, Arta Silence, MD, Jeanella Cara, RN, William Dalton, Technician Referring MD:             Arta Silence, MD Medicines:                General Anesthesia Complications:            No immediate complications. Estimated Blood Loss:     Estimated blood loss was minimal. Procedure:                Pre-Anesthesia Assessment:                           - Prior to the procedure, a History and Physical                            was performed, and patient medications and                            allergies were reviewed. The patient's tolerance of                            previous anesthesia was also reviewed. The risks                            and benefits of the procedure and the sedation                            options and risks were discussed with the patient.                            All questions were answered, and informed consent                            was obtained. Prior Anticoagulants: The patient has                            taken no previous anticoagulant or antiplatelet                            agents. ASA Grade Assessment: III - A patient with                            severe systemic disease. After reviewing the risks  and benefits, the patient was deemed in                            satisfactory condition to undergo the procedure.                           After obtaining informed consent, the scope was                            passed under direct vision. Throughout the                            procedure, the patient's blood pressure, pulse, and                             oxygen saturations were monitored continuously. The                            [id number]-Q190V ([id number] Olympus duodenoscope was                            introduced through the mouth, and used to inject                            contrast into and used to inject contrast into the                            bile duct. The ERCP was accomplished without                            difficulty. The patient tolerated the procedure                            well. EUS was performed by Dr. Paulita Fujita prior to ERCP                            with these findings: An irregular hypoechoic mass                            was identified endosonographically in the common                            bile duct. The mass measured 6 mm by 6 mm in                            maximal cross-sectional diameter. The outer margins                            were poorly-defined. Fine needle aspiration for                            cytology was performed. Scope In: Scope Out: Findings:      The scout film was normal. The esophagus was  successfully intubated       under direct vision. The scope was advanced to a normal major papilla in       the descending duodenum without detailed examination of the pharynx,       larynx and associated structures, and upper GI tract. A small blood clot       was noted next to the papilla, possibily from EUS w/ FNA. The upper GI       tract was otherwise grossly normal. A straight Roadrunner 0.035 wire was       used and entered the distal PD and distal CBD however I was unable to       pass the CBD stricture. We changed to a Revolution 0.025 wire which was       passed across the stricture into the biliary tree. The small caliber       short-nosed traction sphincterotome was passed over the guidewire and       the bile duct was then deeply cannulated. Contrast was injected. I       personally interpreted the bile duct images. Ductal flow of contrast was        adequate. Image quality was adequate however the right hepatic system       did not fill. Contrast extended to the left hepatic ducts. The middle       third of the main bile duct and upper third of the main bile duct were       diffusely dilated, secondary to a stricture. The largest diameter was 15       mm. The lower third of the main bile duct contained a single localized       severe stenosis 10 mm in length. The biliary tree was otherwise normal.       For improved access and stent placement a 7 mm biliary sphincterotomy       was made with a traction (standard) sphincterotome using ERBE       electrocautery. There was no post-sphincterotomy bleeding. Cells for       cytology were obtained by brushing in the lower third of the main bile       duct x 2. One 10 Fr by 7 cm plastic stent with a single external flap       and a single internal flap was placed 6 cm into the common bile duct.       Clear bilious fluid flowed through the stent. The biliary tree was       draining well. The stent was in good position. The PD was not injected       with contrast by intention. 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                           - The proximal biliary tree was dilated, secondary  to a stricture.                           - A biliary sphincterotomy was performed.                           - Cells for cytology obtained in the lower third of                            the main duct.                           - One plastic stent was placed into the common bile                            duct. Moderate Sedation:      Not Applicable - Patient had care per Anesthesia. Recommendation:           - Avoid aspirin and nonsteroidal anti-inflammatory                            medicines for 1 week.                            - Return patient to hospital ward for ongoing care.                           - Observe patient's clinical course following                            today's ERCP with therapeutic intervention.                           - Await cytology.                           - Trend LFTs.                           - Clear liquid diet today. Procedure Code(s):        --- Professional ---                           [phone], Endoscopic retrograde                            cholangiopancreatography (ERCP); with placement of                            endoscopic stent into biliary or pancreatic duct,                            including pre- and post-dilation and guide wire                            passage, when performed, including sphincterotomy,  when performed, each stent Diagnosis Code(s):        --- Professional ---                           K83.1, Obstruction of bile duct                           R17, Unspecified jaundice                           R93.2, Abnormal findings on diagnostic imaging of                            liver and biliary tract CPT copyright 2019 American Medical Association. All rights reserved. The codes documented in this report are preliminary and upon coder review may  be revised to meet current compliance requirements. Ladene Artist, MD 10/11/2021 1:28:35 PM This report has been signed electronically. Arta Silence, MD Number of Addenda: 0

## 2021-10-11 NOTE — Anesthesia Procedure Notes (Signed)
Procedure Name: Intubation Date/Time: 10/11/2021 11:20 AM  Performed by: Effie Berkshire, MDPre-anesthesia Checklist: Patient identified, Emergency Drugs available, Suction available and Patient being monitored Patient Re-evaluated:Patient Re-evaluated prior to induction Oxygen Delivery Method: Circle system utilized Preoxygenation: Pre-oxygenation with 100% oxygen Induction Type: IV induction Ventilation: Mask ventilation without difficulty Laryngoscope Size: Mac and 4 Grade View: Grade II Tube type: Oral Tube size: 7.5 mm Number of attempts: 2 Airway Equipment and Method: Stylet and Oral airway Placement Confirmation: ETT inserted through vocal cords under direct vision, positive ETCO2 and breath sounds checked- equal and bilateral Secured at: 22 cm Tube secured with: Tape Dental Injury: Teeth and Oropharynx as per pre-operative assessment and Injury to lip  Difficulty Due To: Difficulty was anticipated Comments: CRNA x1, Grade 2b view, esophageal intubation. MDA x1, Grade 2a view, endotracheal intubation. Upper lip abrasion noted after successful intubation.

## 2021-10-11 NOTE — Transfer of Care (Signed)
Immediate Anesthesia Transfer of Care Note  Patient: NIKOLOS BILLIG  Procedure(s) Performed: UPPER ENDOSCOPIC ULTRASOUND (EUS) LINEAR FINE NEEDLE ASPIRATION (FNA) LINEAR ENDOSCOPIC RETROGRADE CHOLANGIOPANCREATOGRAPHY (ERCP) SPHINCTEROTOMY BILIARY BRUSHING BILIARY STENT PLACEMENT  Patient Location: PACU  Anesthesia Type:General  Level of Consciousness: awake  Airway & Oxygen Therapy: Patient Spontanous Breathing  Post-op Assessment: Report given to RN  Post vital signs: stable  Last Vitals:  Vitals Value Taken Time  BP 111/55 10/11/21 1321  Temp 36.6 C 10/11/21 1321  Pulse 43 10/11/21 1329  Resp 16 10/11/21 1329  SpO2 98 % 10/11/21 1329  Vitals shown include unvalidated device data.  Last Pain:  Vitals:   10/11/21 1321  TempSrc: Temporal  PainSc: 0-No pain         Complications:  Encounter Notable Events  Notable Event Outcome Phase Comment  Difficult to intubate - expected  Intraprocedure Filed from anesthesia note documentation.

## 2021-10-11 NOTE — Anesthesia Postprocedure Evaluation (Signed)
Anesthesia Post Note  Patient: Patrick Collier  Procedure(s) Performed: UPPER ENDOSCOPIC ULTRASOUND (EUS) LINEAR FINE NEEDLE ASPIRATION (FNA) LINEAR ENDOSCOPIC RETROGRADE CHOLANGIOPANCREATOGRAPHY (ERCP) SPHINCTEROTOMY BILIARY BRUSHING BILIARY STENT PLACEMENT     Patient location during evaluation: PACU Anesthesia Type: General Level of consciousness: awake and alert Pain management: pain level controlled Vital Signs Assessment: post-procedure vital signs reviewed and stable Respiratory status: spontaneous breathing, nonlabored ventilation, respiratory function stable and patient connected to nasal cannula oxygen Cardiovascular status: blood pressure returned to baseline and stable Postop Assessment: no apparent nausea or vomiting Anesthetic complications: yes   Encounter Notable Events  Notable Event Outcome Phase Comment  Difficult to intubate - expected  Intraprocedure Filed from anesthesia note documentation.    Last Vitals:  Vitals:   10/11/21 1400 10/11/21 1416  BP:  118/66  Pulse: (!) 42 (!) 48  Resp: 15 18  Temp:  36.6 C  SpO2: 98% 99%    Last Pain:  Vitals:   10/11/21 1416  TempSrc: Oral  PainSc:                  Effie Berkshire

## 2021-10-12 ENCOUNTER — Encounter (HOSPITAL_COMMUNITY): Payer: Self-pay | Admitting: Gastroenterology

## 2021-10-12 DIAGNOSIS — R933 Abnormal findings on diagnostic imaging of other parts of digestive tract: Secondary | ICD-10-CM

## 2021-10-12 DIAGNOSIS — R17 Unspecified jaundice: Secondary | ICD-10-CM | POA: Diagnosis not present

## 2021-10-12 DIAGNOSIS — R978 Other abnormal tumor markers: Secondary | ICD-10-CM | POA: Diagnosis not present

## 2021-10-12 DIAGNOSIS — K831 Obstruction of bile duct: Secondary | ICD-10-CM | POA: Diagnosis not present

## 2021-10-12 LAB — LIPASE, BLOOD: Lipase: 25 U/L (ref 11–51)

## 2021-10-12 LAB — CBC
HCT: 36 % — ABNORMAL LOW (ref 39.0–52.0)
Hemoglobin: 12.6 g/dL — ABNORMAL LOW (ref 13.0–17.0)
MCH: 33.5 pg (ref 26.0–34.0)
MCHC: 35 g/dL (ref 30.0–36.0)
MCV: 95.7 fL (ref 80.0–100.0)
Platelets: 249 10*3/uL (ref 150–400)
RBC: 3.76 MIL/uL — ABNORMAL LOW (ref 4.22–5.81)
RDW: 16.5 % — ABNORMAL HIGH (ref 11.5–15.5)
WBC: 9 10*3/uL (ref 4.0–10.5)
nRBC: 0 % (ref 0.0–0.2)

## 2021-10-12 LAB — COMPREHENSIVE METABOLIC PANEL
ALT: 122 U/L — ABNORMAL HIGH (ref 0–44)
AST: 141 U/L — ABNORMAL HIGH (ref 15–41)
Albumin: 2.2 g/dL — ABNORMAL LOW (ref 3.5–5.0)
Alkaline Phosphatase: 460 U/L — ABNORMAL HIGH (ref 38–126)
Anion gap: 6 (ref 5–15)
BUN: 14 mg/dL (ref 8–23)
CO2: 26 mmol/L (ref 22–32)
Calcium: 8.3 mg/dL — ABNORMAL LOW (ref 8.9–10.3)
Chloride: 105 mmol/L (ref 98–111)
Creatinine, Ser: 0.91 mg/dL (ref 0.61–1.24)
GFR, Estimated: >60 mL/min (ref >60)
Glucose, Bld: 161 mg/dL — ABNORMAL HIGH (ref 70–99)
Potassium: 4.5 mmol/L (ref 3.5–5.1)
Sodium: 137 mmol/L (ref 135–145)
Total Bilirubin: 8.2 mg/dL — ABNORMAL HIGH (ref 0.3–1.2)
Total Protein: 6.4 g/dL — ABNORMAL LOW (ref 6.5–8.1)

## 2021-10-12 LAB — AMMONIA: Ammonia: 39 umol/L — ABNORMAL HIGH (ref 9–35)

## 2021-10-12 LAB — PROTIME-INR
INR: 1.5 — ABNORMAL HIGH (ref 0.8–1.2)
Prothrombin Time: 18 seconds — ABNORMAL HIGH (ref 11.4–15.2)

## 2021-10-12 LAB — MAGNESIUM: Magnesium: 2 mg/dL (ref 1.7–2.4)

## 2021-10-12 MED ORDER — LACTULOSE 10 GM/15ML PO SOLN: 20.0000 g | Freq: Two times a day (BID) | ORAL | 0 refills | Status: DC | PRN

## 2021-10-12 NOTE — Care Management Important Message (Signed)
Important Message  Patient Details IM Letter given to the Patient. Name: Patrick Collier MRN: [id number] Date of Birth: June 28, 1940   Medicare Important Message Given:  Yes     Kerin Salen 10/12/2021, 1:47 PM

## 2021-10-12 NOTE — Progress Notes (Signed)
[facility] Gastroenterology Progress Note  SONU KRUCKENBERG 81 y.o. 1940-06-19  CC: Painless jaundice   Subjective: Patient seen and examined at bedside.  Denies abdominal pain, nausea, vomiting.  Tolerating diet without difficulty.  ROS : Review of Systems  Constitutional:  Negative for chills and fever.  Gastrointestinal:  Negative for abdominal pain, blood in stool, constipation, diarrhea, heartburn, melena, nausea and vomiting.      Objective: Vital signs in last 24 hours: Vitals:   10/11/21 1416 10/11/21 2000  BP: 118/66 116/66  Pulse: (!) 48 (!) 43  Resp: 18 20  Temp: 97.9 F (36.6 C) 98 F (36.7 C)  SpO2: 99% 99%    Physical Exam:  General:  Alert, cooperative, jaundiced  Head:  Normocephalic, without obvious abnormality, atraumatic  Eyes:  icteric sclera, EOM's intact  Lungs:   Clear to auscultation bilaterally, respirations unlabored  Heart:  Regular rate and rhythm, S1, S2 normal  Abdomen:   Soft, non-tender, bowel sounds active all four quadrants,  no masses,     Lab Results: Recent Labs    10/10/21 0434 10/11/21 0505 10/12/21 0443  NA 139 137 137  K 3.4* 4.2 4.5  CL 107 106 105  CO2 25 25 26   GLUCOSE 106* 108* 161*  BUN 11 10 14   CREATININE 0.71 0.57* 0.91  CALCIUM 8.1* 8.0* 8.3*  MG 2.0  --  2.0   Recent Labs    10/11/21 0505 10/12/21 0443  AST 149* 141*  ALT 114* 122*  ALKPHOS 468* 460*  BILITOT 9.9* 8.2*  PROT 6.2* 6.4*  ALBUMIN 2.1* 2.2*   Recent Labs    10/11/21 0505 10/12/21 0443  WBC 7.6 9.0  HGB 11.8* 12.6*  HCT 33.5* 36.0*  MCV 95.4 95.7  PLT 216 249   Recent Labs    10/11/21 0505 10/12/21 0443  LABPROT 18.6* 18.0*  INR 1.6* 1.5*      Assessment Painless jaundice with 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 - MRCP showed severe bile duct dilation with CBD up to 2cm with abrupt cut off at the distal common bile duct - CT severe dilation of the intra and extrahepatic duct with a abrupt narrowing at the head of the  pancreas with apparent hyperdense material within the common bile duct or surrounding area concerning for cholangiocarcinoma or CBD stricture versus choledocholithiasis.  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 -AST 141, ALT 122, alk phos 460, stable - T. Bili 8.2 (9.9 yesterday, improving) - CA 19-9: 129 -EUS 6/21: Mass found in CBD, FNA performed.  Few abnormal lymph nodes in peripancreatic region and porta hepatis region.  Dilation in the middle third of the main bile duct in the upper third main bile duct and common hepatic duct.  Small perihepatic ascites.  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  Biliary sphincterotomy performed.  Cells for cytology obtained.  1 plastic stent placed in CBD  Plan: EUS with FNA performed of mass found in common bile duct.  ERCP performed yesterday showing malignant appearing biliary stricture in the lower third main bile duct with proximal biliary tree dilated.  Sphincterotomy performed and stent placed in CBD.  Improving bilirubin at this time. Continue to await biopsies Continue to monitor LFTs [facility] will follow  Garnette Scheuermann PA-C 10/12/2021, 10:08 AM  Contact #  [phone]

## 2021-10-12 NOTE — Discharge Summary (Signed)
Physician Discharge Summary  Patrick Collier DZH:[id number] DOB: 1940/12/27 DOA: 10/06/2021  PCP: London Pepper, MD  Admit date: 10/06/2021 Discharge date: 10/12/2021 Admitted From: Home Disposition: Home Recommendations for Outpatient Follow-up:  Follow-up with PCP as below GI to arrange outpatient follow-up Please obtain CBC and CMP in 1 week Please follow up on the following pending results: Biopsy from ERCP  Home Health: Not indicated Equipment/Devices: Not indicated  Discharge Condition: Stable CODE STATUS: Full code  Follow-up Information     London Pepper, MD. Schedule an appointment as soon as possible for a visit in 1 week(s).   Specialty: Family Medicine Contact information: [address] [place] [id number] [facility] 81 year old M with PMH of ICH, chronic back pain, osteoarthritis, bradycardia and heart murmur directed to ED by PCP due to abnormal CT scan after he presented to PCP with jaundice and pale stool for about a week.  Liver enzymes, bilirubin and ALP elevated.  CT abdomen and pelvis showed evidence of severe intra and extrahepatic dilation with narrowing at the head of the pancreas, pancreatic body cyst.  CA 19-9 elevated to 129.  MRCP confirms severe intra and extrahepatic dilation of the bile duct with abrupt cut off at the distal CBD concerning for stricture or obstructive tumor.    Patient underwent EUS and ERCP on 10/11/2021.  Finding concerning for single localized malignant appearing severe biliary structure in the lower third of main bile duct with proximal biliary tree dilation.  He had a sphincterotomy.  Cells for cytology obtained in the lower third of the main duct.  1 plastic stent was placed in the common bile duct.  On the day of discharge, slight improvement in bilirubin.  Tolerated soft diet.  He was cleared for discharge by gastroenterology who will follow up on cytology result and  arrange outpatient follow-up.  See individual problem list below for more.   Problems addressed during this hospitalization Principal Problem:   Obstructive jaundice Active Problems:   Hyperbilirubinemia   Elevated CA 19-9 level   Hyperammonemia (HCC)   Moderate aortic stenosis   Lower back rash   Hyperglycemia   Hypokalemia   Osteoarthritis of right knee   Sinus bradycardia   Jaundice   Abnormal magnetic resonance cholangiopancreatography (MRCP)   Common bile duct (CBD) stricture   Painless obstructive jaundice Elevated liver enzymes/hyperbilirubinemia/alkaline phosphatase-relatively stable. Coagulopathy improved.  INR 1.5.  No signs of bleeding. Elevated CA 19-9 level CT abdomen and pelvis and MRCP as above.  CA 19-9 elevated to 129. Recent Labs  Lab 10/08/21 0432 10/09/21 0349 10/10/21 0434 10/11/21 0505 10/12/21 0443  AST 151* 143* 146* 149* 141*  ALT 120* 115* 119* 114* 122*  ALKPHOS 463* 429* 458* 468* 460*  BILITOT 8.8* 9.2* 9.9* 9.9* 8.2*  PROT 5.8* 5.7* 6.0* 6.2* 6.4*  ALBUMIN 2.2* 2.2* 2.2* 2.1* 2.2*  -EUS and ERCP concerning for cholangiocarcinoma -Status post sphincterectomy and biliary stent. -Tolerated soft diet.  Cleared for discharge by GI. -GI to follow-up on cytology.    Normocytic anemia: Stable. Recent Labs    10/06/21 1910 10/07/21 0458 10/08/21 0432 10/09/21 0349 10/10/21 0434 10/11/21 0505 10/12/21 0443  HGB 13.8 12.5* 12.0* 11.6* 12.2* 11.8* 12.6*  -Recheck CBC at follow-up   Hyperammonia: Not encephalopathic.  Improved. -Lactulose 20 g twice daily as needed   Hyperglycemia: A1c 5.4%.   Hypokalemia: Resolved.  Heart murmur/history of moderate aortic stenosis: Not symptomatic. -Outpatient follow-up   Sinus bradycardia: Not symptomatic.  Not on nodal blocking agents   Skin rash/contact dermatitis: Resolved.            Vital signs Vitals:   10/11/21 1400 10/11/21 1416 10/11/21 2000 10/12/21 1223  BP:  118/66 116/66  119/72  Pulse: (!) 42 (!) 48 (!) 43 (!) 41  Temp:  97.9 F (36.6 C) 98 F (36.7 C) 98 F (36.7 C)  Resp: '15 18 20 16  '$ Height:      Weight:      SpO2: 98% 99% 99% 100%  TempSrc:  Oral Oral   BMI (Calculated):         Discharge exam  GENERAL: No apparent distress.  Nontoxic. HEENT: MMM.  Sclera icteric. NECK: Supple.  No apparent JVD.  RESP:  No IWOB.  Fair aeration bilaterally. CVS:  RRR. Heart sounds normal.  ABD/GI/GU: BS+. Abd soft, NTND.  MSK/EXT:  Moves extremities. No apparent deformity. No edema.  SKIN: Some skin jaundice in his face NEURO: Awake and alert. Oriented appropriately.  No apparent focal neuro deficit. PSYCH: Calm. Normal affect.   Discharge Instructions Discharge Instructions     Call MD for:  difficulty breathing, headache or visual disturbances   Complete by: As directed    Call MD for:  extreme fatigue   Complete by: As directed    Call MD for:  persistant dizziness or light-headedness   Complete by: As directed    Call MD for:  persistant nausea and vomiting   Complete by: As directed    Call MD for:  severe uncontrolled pain   Complete by: As directed    Diet general   Complete by: As directed    Soft bland diet with low-fat or grease.   Discharge instructions   Complete by: As directed    It has been a pleasure taking care of you!  You were hospitalized due to jaundice for which you have been evaluated and treated endoscopically.  The result of your biopsy has not resulted yet.  Your gastroenterologist will follow up on the results to let you know.  Follow-up with your primary care doctor in 1 to 2 weeks or sooner if needed.   Take care,   Increase activity slowly   Complete by: As directed       Allergies as of 10/12/2021   No Known Allergies      Medication List     TAKE these medications    acetaminophen 650 MG CR tablet Commonly known as: TYLENOL Take 500 mg by mouth every 8 (eight) hours as needed for pain.   lactulose  10 GM/15ML solution Commonly known as: CHRONULAC Take 30 mLs (20 g total) by mouth 2 (two) times daily as needed for mild constipation or moderate constipation.        Consultations: Gastroenterology  Procedures/Studies ERCP on 10/11/2021 - A single localized malignant appearing severe biliary stricture was found in the lower third of the CBD - The proximal biliary tree was dilated, secondary to a stricture. - A biliary sphincterotomy was performed. - Cells for cytology obtained in the lower third of the main duct.   DG ERCP  Result Date: 10/11/2021 CLINICAL DATA:  81 year old male with a history of biliary obstruction EXAM: ERCP TECHNIQUE: Multiple spot images obtained with the fluoroscopic device and submitted for interpretation post-procedure. FLUOROSCOPY: Radiation Exposure Index (as provided by the fluoroscopic device): 73 mGy Kerma COMPARISON:  MR 10/07/2021 FINDINGS: Limited intraoperative fluoroscopic spot images during ERCP. Initial image demonstrates endoscope projecting over the upper abdomen. Subsequently there is placement of a safety wire and retrograde infusion of contrast, partially opacifying the extrahepatic and intrahepatic biliary ducts. Extrahepatic ducts and the visualized intrahepatic ducts are dilated. Subsequent placement of a plastic biliary stent. IMPRESSION: Limited images during ERCP demonstrates dilated biliary ducts and placement of plastic biliary stent. Please refer to the dictated operative report for full details of intraoperative findings and procedure. Electronically Signed   By: Corrie Mckusick D.O.   On: 10/11/2021 13:25   MR ABDOMEN MRCP W WO CONTAST  Result Date: 10/07/2021 CLINICAL DATA:  Biliary obstruction suspected. EXAM: MRI ABDOMEN WITHOUT AND WITH CONTRAST (INCLUDING MRCP) TECHNIQUE: Multiplanar multisequence MR imaging of the abdomen was performed both before and after the administration of intravenous contrast. Heavily T2-weighted images of the  biliary and pancreatic ducts were obtained, and three-dimensional MRCP images were rendered by post processing. CONTRAST:  41m GADAVIST GADOBUTROL 1 MMOL/ML IV SOLN COMPARISON:  None Available. FINDINGS: Lower chest: Trace bilateral pleural effusions. Hepatobiliary: No suspicious liver lesions. Severe biliary ductal dilation abdominal pain both the intrahepatic extrahepatic bile ducts, common bile duct measures up to 2.0 cm. Abrupt cutoff is seen at the distal common bile duct. No evidence of choledocholithiasis. No obstructing mass is seen although motion artifact on contrast-enhanced imaging somewhat limits evaluation gallbladder is filled with T2 hypointense and T1 hyperintense material, likely sludge or contrast material related to prior exam. Pancreas: No pancreatic ductal dilation. Small cystic lesions of the pancreatic tail, largest measures 10 mm on series 13, image 16. Spleen:  Within normal limits in size and appearance. Adrenals/Urinary Tract: No masses identified. No evidence of hydronephrosis. Stomach/Bowel: Visualized portions within the abdomen are unremarkable. Vascular/Lymphatic: No pathologically enlarged lymph nodes identified. No abdominal aortic aneurysm demonstrated. Other:  Trace perihepatic ascites. Musculoskeletal: No suspicious bone lesions identified. IMPRESSION: 1. Severe biliary ductal dilation of both the intra and extrahepatic bile ducts with abrupt cutoff seen at the distal common bile duct. Findings are concerning for stricture or obstructing tumor. No obstructing mass is seen, although motion artifact on contrast-enhanced imaging limits evaluation. Recommend correlation with ERCP. 2. Small cystic lesions of the pancreatic tail, largest measures 10 mm. Recommend follow up pre and post contrast MRI/MRCP or pancreatic protocol CT in 2 years. This recommendation follows ACR consensus guidelines: Management of Incidental Pancreatic Cysts: A White Paper of the ACR Incidental Findings  Committee. J Am Coll Radiol 28469;62:[id number] 3. Trace perihepatic ascites and trace bilateral pleural effusions. Electronically Signed   By: LYetta GlassmanM.D.   On: 10/07/2021 13:06   MR 3D Recon At Scanner  Result Date: 10/07/2021 CLINICAL DATA:  Biliary obstruction suspected. EXAM: MRI ABDOMEN WITHOUT AND WITH CONTRAST (INCLUDING MRCP) TECHNIQUE: Multiplanar multisequence MR imaging of the abdomen was performed both before and after the administration of intravenous contrast. Heavily T2-weighted images of the biliary and pancreatic ducts were obtained, and three-dimensional MRCP images were rendered by post processing. CONTRAST:  826mGADAVIST GADOBUTROL 1 MMOL/ML IV SOLN COMPARISON:  None Available. FINDINGS: Lower chest: Trace bilateral pleural effusions. Hepatobiliary: No suspicious liver lesions. Severe biliary ductal dilation abdominal pain both the intrahepatic extrahepatic bile ducts, common bile duct measures up to 2.0 cm. Abrupt cutoff is seen at the distal common bile duct. No evidence of choledocholithiasis. No obstructing mass is seen although motion artifact on contrast-enhanced imaging somewhat limits evaluation gallbladder is filled with T2 hypointense and T1  hyperintense material, likely sludge or contrast material related to prior exam. Pancreas: No pancreatic ductal dilation. Small cystic lesions of the pancreatic tail, largest measures 10 mm on series 13, image 16. Spleen:  Within normal limits in size and appearance. Adrenals/Urinary Tract: No masses identified. No evidence of hydronephrosis. Stomach/Bowel: Visualized portions within the abdomen are unremarkable. Vascular/Lymphatic: No pathologically enlarged lymph nodes identified. No abdominal aortic aneurysm demonstrated. Other:  Trace perihepatic ascites. Musculoskeletal: No suspicious bone lesions identified. IMPRESSION: 1. Severe biliary ductal dilation of both the intra and extrahepatic bile ducts with abrupt cutoff seen at the  distal common bile duct. Findings are concerning for stricture or obstructing tumor. No obstructing mass is seen, although motion artifact on contrast-enhanced imaging limits evaluation. Recommend correlation with ERCP. 2. Small cystic lesions of the pancreatic tail, largest measures 10 mm. Recommend follow up pre and post contrast MRI/MRCP or pancreatic protocol CT in 2 years. This recommendation follows ACR consensus guidelines: Management of Incidental Pancreatic Cysts: A White Paper of the ACR Incidental Findings Committee. J Am Coll Radiol 3875;64:[id number]. 3. Trace perihepatic ascites and trace bilateral pleural effusions. Electronically Signed   By: Yetta Glassman M.D.   On: 10/07/2021 13:06       The results of significant diagnostics from this hospitalization (including imaging, microbiology, ancillary and laboratory) are listed below for reference.     Microbiology: No results found for this or any previous visit (from the past 240 hour(s)).   Labs:  CBC: Recent Labs  Lab 10/06/21 1910 10/07/21 0458 10/08/21 0432 10/09/21 0349 10/10/21 0434 10/11/21 0505 10/12/21 0443  WBC 7.8   < > 7.4 7.6 8.2 7.6 9.0  NEUTROABS 6.2  --   --   --   --   --   --   HGB 13.8   < > 12.0* 11.6* 12.2* 11.8* 12.6*  HCT 41.1   < > 35.1* 33.3* 34.0* 33.5* 36.0*  MCV 98.6   < > 96.7 96.8 94.2 95.4 95.7  PLT 229   < > 185 195 222 216 249   < > = values in this interval not displayed.   BMP &GFR Recent Labs  Lab 10/08/21 0432 10/09/21 0349 10/10/21 0434 10/11/21 0505 10/12/21 0443  NA 137 137 139 137 137  K 3.4* 3.4* 3.4* 4.2 4.5  CL 104 103 107 106 105  CO2 '27 28 25 25 26  '$ GLUCOSE 135* 129* 106* 108* 161*  BUN '13 12 11 10 14  '$ CREATININE 0.82 0.85 0.71 0.57* 0.91  CALCIUM 8.0* 7.9* 8.1* 8.0* 8.3*  MG 2.1 2.0 2.0  --  2.0   Estimated Creatinine Clearance: 61.6 mL/min (by C-G formula based on SCr of 0.91 mg/dL). Liver & Pancreas: Recent Labs  Lab 10/08/21 0432 10/09/21 0349  10/10/21 0434 10/11/21 0505 10/12/21 0443  AST 151* 143* 146* 149* 141*  ALT 120* 115* 119* 114* 122*  ALKPHOS 463* 429* 458* 468* 460*  BILITOT 8.8* 9.2* 9.9* 9.9* 8.2*  PROT 5.8* 5.7* 6.0* 6.2* 6.4*  ALBUMIN 2.2* 2.2* 2.2* 2.1* 2.2*   Recent Labs  Lab 10/06/21 1910 10/12/21 0443  LIPASE 38 25   Recent Labs  Lab 10/06/21 2008 10/11/21 0505 10/12/21 0443  AMMONIA 60* 56* 39*   Diabetic: Recent Labs    10/11/21 0505  HGBA1C 5.4   No results for input(s): "GLUCAP" in the last 168 hours. Cardiac Enzymes: No results for input(s): "CKTOTAL", "CKMB", "CKMBINDEX", "TROPONINI" in the last 168 hours. No results for input(s): "PROBNP"  in the last 8760 hours. Coagulation Profile: Recent Labs  Lab 10/06/21 2008 10/11/21 0505 10/12/21 0443  INR 1.2 1.6* 1.5*   Thyroid Function Tests: No results for input(s): "TSH", "T4TOTAL", "FREET4", "T3FREE", "THYROIDAB" in the last 72 hours. Lipid Profile: No results for input(s): "CHOL", "HDL", "LDLCALC", "TRIG", "CHOLHDL", "LDLDIRECT" in the last 72 hours. Anemia Panel: No results for input(s): "VITAMINB12", "FOLATE", "FERRITIN", "TIBC", "IRON", "RETICCTPCT" in the last 72 hours. Urine analysis:    Component Value Date/Time   COLORURINE AMBER (A) 10/06/2021 1944   APPEARANCEUR CLEAR 10/06/2021 1944   LABSPEC >1.046 (H) 10/06/2021 1944   PHURINE 5.0 10/06/2021 1944   GLUCOSEU NEGATIVE 10/06/2021 1944   HGBUR SMALL (A) 10/06/2021 1944   BILIRUBINUR MODERATE (A) 10/06/2021 [place] NEGATIVE 10/06/2021 1944   PROTEINUR 30 (A) 10/06/2021 1944   NITRITE NEGATIVE 10/06/2021 1944   LEUKOCYTESUR NEGATIVE 10/06/2021 1944   Sepsis Labs: Invalid input(s): "PROCALCITONIN", "LACTICIDVEN"   SIGNED:  Mercy Riding, MD  [facility] 10/12/2021, 3:01 PM

## 2021-10-13 LAB — CYTOLOGY - NON PAP

## 2021-10-18 DIAGNOSIS — R7989 Other specified abnormal findings of blood chemistry: Secondary | ICD-10-CM | POA: Diagnosis not present

## 2021-10-18 DIAGNOSIS — D649 Anemia, unspecified: Secondary | ICD-10-CM | POA: Diagnosis not present

## 2021-10-18 DIAGNOSIS — R17 Unspecified jaundice: Secondary | ICD-10-CM | POA: Diagnosis not present

## 2021-10-18 DIAGNOSIS — R748 Abnormal levels of other serum enzymes: Secondary | ICD-10-CM | POA: Diagnosis not present

## 2021-10-18 DIAGNOSIS — K839 Disease of biliary tract, unspecified: Secondary | ICD-10-CM | POA: Diagnosis not present

## 2021-10-18 DIAGNOSIS — Z09 Encounter for follow-up examination after completed treatment for conditions other than malignant neoplasm: Secondary | ICD-10-CM | POA: Diagnosis not present

## 2021-10-20 ENCOUNTER — Telehealth: Payer: Self-pay | Admitting: Physician Assistant

## 2021-10-20 NOTE — Telephone Encounter (Signed)
Scheduled appt per 6/30 referral. Pt is aware of appt date and time. Pt is aware to arrive 15 mins prior to appt time and to bring and updated insurance card. Pt is aware of appt location.   

## 2021-10-26 DIAGNOSIS — C221 Intrahepatic bile duct carcinoma: Secondary | ICD-10-CM | POA: Insufficient documentation

## 2021-10-26 NOTE — Progress Notes (Unsigned)
[place] [phone]   Fax:([phone]) [phone]  CONSULT NOTE  REFERRING PHYSICIAN: Dr. Paulita Fujita  REASON FOR CONSULTATION:  Suspicious cholangiocarcinoma  HPI SAHITH NURSE is a 81 y.o. male with a past medical history for aortic stenosis with preserved EF (follows with Dr. 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   The patient saw his PCP in June 2023 for new onset jaundice and elevated LFTs.  The patient's PCP ordered a CT scan in [place]. The impression reports severe dilation of the intra and extrahepatic ducts with abrupt narrowing of the common bile duct in the head of the pancreas.  There is apparent hypodense material within the common bile duct or surrounding concerning for obstructing lesion such as cholangiocarcinoma with the differential including stricture, choledocholithiasis, or less likely pancreatic head mass.   The patient was subsequently instructed to go to the emergency room.   The patient was admitted to the hospital from 10/06/2021 to 10/12/2021 for further work-up.  He had new onset jaundice for 1 week.  His bilirubin was over 11 upon admission.  AST elevated at 232, ALT 159, and alkaline phosphatase 583. The patient reported decreased appetite, dark urine, light stools, and unintentional weight loss (10-15 lbs in a few months).  Denied any associated abdominal pain, nausea, vomiting, or diarrhea.   He had an MRCP with and without contrast on 10/07/2021 which showed severe biliary ductal dilation of both the intra and extrahepatic bile ducts with abrupt cutoff seen at the distal common bile duct for which the findings are concerning for stricture or obstructing tumor.  There was also a small cystic lesion in the pancreatic tail measuring 10 mm.  The patient had an EUS performed on 10/11/2021 which showed irregular hypoechoic  mass in the common bile duct measuring 6 mm x 6 mm.  There was a few abnormal lymph nodes visualized in the Peri pancreatic region and porta hepatis.  The final pathology of the FNA did not show any malignant cells and only showed scant cellularity, benign/reactive ductal cells and ductal epithelium with acute inflammatory cells.  The brushings of the common bile duct stricture also showed atypical cells suspicious for tumor present.  The patient had an ERCP on 10/11/2021.  A plastic biliary stent was placed.  Overall, the patient is feeling "all right". He reports improved jaundice and feels he is at his baseline. His urine is no longer dark. His stool is no longer light. He denies chills or night sweats. He denies abdominal pain,  diarrhea, obstipation, jaundice, or back pain.  The patient had 1 episode of nausea and vomiting a few days ago but associates that with drinking too many milkshakes.  The patient's wife is wondering if he has any dietary restrictions.  The patient denies any shortness of breath cough, or chest pain.  The patient is currently on lactulose for high ammonia levels.  The patient is wondering if he can discontinue the lactulose.  He denies any GI upset with lactulose.  The patient's sister had a Whipple procedure but the patient and his wife do not feel that she was diagnosed with any malignancy.  The patient's mother had diabetes and passed away in her sleep.  The patient's father had prostate cancer.  The patient used to work at [employer].  He is married and has 3 children 2 of which that live in  [place].  The patient is independent with his activities of daily living.  Prior to his recent hospitalizations he was walking 2 miles a day and doing yard work almost daily.  The patient is a former smoker having quit 40 to 50 years ago.  He is unable to estimate how many packs of cigarettes a day he smokes since he generally did not purchase cigarettes and smoked  socially.  Denies any history of street drug use.  Prior to his recent diagnosis he was drinking 1 beer a day.    HPI  Past Medical History:  Diagnosis Date   Arthritis    Back pain    Heart murmur    History of kidney stones    ICH (intracerebral hemorrhage) ([place])     Past Surgical History:  Procedure Laterality Date   BACK SURGERY     BILIARY BRUSHING  10/11/2021   Procedure: BILIARY BRUSHING;  Surgeon: Ladene Artist, MD;  Location: [facility];  Service: Gastroenterology;;   BILIARY STENT PLACEMENT N/A 10/11/2021   Procedure: BILIARY STENT PLACEMENT;  Surgeon: Ladene Artist, MD;  Location: [facility];  Service: Gastroenterology;  Laterality: N/A;   ERCP N/A 10/11/2021   Procedure: ENDOSCOPIC RETROGRADE CHOLANGIOPANCREATOGRAPHY (ERCP);  Surgeon: Ladene Artist, MD;  Location: [facility];  Service: Gastroenterology;  Laterality: N/A;   ESOPHAGOGASTRODUODENOSCOPY (EGD) WITH PROPOFOL N/A 10/11/2021   Procedure: ESOPHAGOGASTRODUODENOSCOPY (EGD) WITH PROPOFOL;  Surgeon: Arta Silence, MD;  Location: [facility];  Service: Gastroenterology;  Laterality: N/A;   EUS N/A 10/11/2021   Procedure: UPPER ENDOSCOPIC ULTRASOUND (EUS) LINEAR;  Surgeon: Arta Silence, MD;  Location: [facility];  Service: Gastroenterology;  Laterality: N/A;   FINE NEEDLE ASPIRATION N/A 10/11/2021   Procedure: FINE NEEDLE ASPIRATION (FNA) LINEAR;  Surgeon: Arta Silence, MD;  Location: [facility];  Service: Gastroenterology;  Laterality: N/A;   INGUINAL HERNIA REPAIR     KNEE ARTHROPLASTY Right 05/16/2017   Procedure: RIGHT TOTAL KNEE ARTHROPLASTY WITH COMPUTER NAVIGATION;  Surgeon: Rod Can, MD;  Location: [facility];  Service: Orthopedics;  Laterality: Right;  Needs RNFA   NECK SURGERY     SPHINCTEROTOMY  10/11/2021   Procedure: SPHINCTEROTOMY;  Surgeon: Ladene Artist, MD;  Location: [facility];  Service: Gastroenterology;;   TONSILLECTOMY      Family History  Problem Relation Age of  Onset   Diabetes Mother     Social History Social History   Tobacco Use   Smoking status: Former   Smokeless tobacco: Never  Scientific laboratory technician Use: Never used  Substance Use Topics   Alcohol use: Yes    Comment: one beer per day   Drug use: No    No Known Allergies  Current Outpatient Medications  Medication Sig Dispense Refill   lactulose (CHRONULAC) 10 GM/15ML solution Take 30 mLs (20 g total) by mouth 2 (two) times daily as needed for mild constipation or moderate constipation. 473 mL 0   No current facility-administered medications for this visit.    REVIEW OF SYSTEMS:   Review of Systems  Constitutional: Positive for decreased appetite and weight loss.  Negative for chills and fever. HENT: Negative for mouth sores, nosebleeds, sore throat and trouble swallowing.   Eyes: Negative for eye problems and icterus.  Respiratory: Negative for cough, hemoptysis, shortness of breath and wheezing.   Cardiovascular: Negative for chest pain and leg swelling.  Gastrointestinal: Jaundice improved.  Negative for abdominal pain, constipation, diarrhea, nausea and vomiting.  Genitourinary: Negative for bladder incontinence,  difficulty urinating, dysuria, frequency and hematuria.   Musculoskeletal: Negative for back pain, gait problem, neck pain and neck stiffness.  Skin: Negative for itching and rash.  Neurological: Negative for dizziness, extremity weakness, gait problem, headaches, light-headedness and seizures.  Hematological: Negative for adenopathy. Does not bruise/bleed easily.  Psychiatric/Behavioral: Negative for confusion, depression and sleep disturbance. The patient is not nervous/anxious.     PHYSICAL EXAMINATION:  Blood pressure 102/63, pulse 83, temperature 97.7 F (36.5 C), temperature source Temporal, resp. rate 16, height '5\' 8"'$  (1.727 m), weight 164 lb 12.8 oz (74.8 kg), SpO2 100 %.  ECOG PERFORMANCE STATUS: 1  Physical Exam  Constitutional: Oriented to person,  place, and time and well-developed, well-nourished, and in no distress.  HENT:  Head: Normocephalic and atraumatic.  Mouth/Throat: Oropharynx is clear and moist. No oropharyngeal exudate.  Eyes: Conjunctivae are normal. Right eye exhibits no discharge. Left eye exhibits no discharge. No scleral icterus.  Neck: Normal range of motion. Neck supple.  Cardiovascular: Normal rate, regular rhythm, systolic murmur noted and intact distal pulses.   Pulmonary/Chest: Effort normal and breath sounds normal. No respiratory distress. No wheezes. No rales.  Abdominal: Soft. Bowel sounds are normal. Exhibits no distension and no mass. There is no tenderness.  Musculoskeletal: Normal range of motion. Exhibits no edema.  Lymphadenopathy:    No cervical adenopathy.  Neurological: Alert and oriented to person, place, and time. Exhibits normal muscle tone. Gait normal. Coordination normal.  Skin: Skin is warm and dry. No rash noted. Not diaphoretic. No erythema. No pallor.  Psychiatric: Mood, memory and judgment normal.  Vitals reviewed.  LABORATORY DATA: Lab Results  Component Value Date   WBC 9.0 10/12/2021   HGB 12.6 (L) 10/12/2021   HCT 36.0 (L) 10/12/2021   MCV 95.7 10/12/2021   PLT 249 10/12/2021      Chemistry      Component Value Date/Time   NA 137 10/12/2021 0443   K 4.5 10/12/2021 0443   CL 105 10/12/2021 0443   CO2 26 10/12/2021 0443   BUN 14 10/12/2021 0443   CREATININE 0.91 10/12/2021 0443      Component Value Date/Time   CALCIUM 8.3 (L) 10/12/2021 0443   ALKPHOS 460 (H) 10/12/2021 0443   AST 141 (H) 10/12/2021 0443   ALT 122 (H) 10/12/2021 0443   BILITOT 8.2 (H) 10/12/2021 0443       RADIOGRAPHIC STUDIES: DG ERCP  Result Date: 10/11/2021 CLINICAL DATA:  81 year old male with a history of biliary obstruction EXAM: ERCP TECHNIQUE: Multiple spot images obtained with the fluoroscopic device and submitted for interpretation post-procedure. FLUOROSCOPY: Radiation Exposure Index  (as provided by the fluoroscopic device): 73 mGy Kerma COMPARISON:  MR 10/07/2021 FINDINGS: Limited intraoperative fluoroscopic spot images during ERCP. Initial image demonstrates endoscope projecting over the upper abdomen. Subsequently there is placement of a safety wire and retrograde infusion of contrast, partially opacifying the extrahepatic and intrahepatic biliary ducts. Extrahepatic ducts and the visualized intrahepatic ducts are dilated. Subsequent placement of a plastic biliary stent. IMPRESSION: Limited images during ERCP demonstrates dilated biliary ducts and placement of plastic biliary stent. Please refer to the dictated operative report for full details of intraoperative findings and procedure. Electronically Signed   By: Corrie Mckusick D.O.   On: 10/11/2021 13:25   MR ABDOMEN MRCP W WO CONTAST  Result Date: 10/07/2021 CLINICAL DATA:  Biliary obstruction suspected. EXAM: MRI ABDOMEN WITHOUT AND WITH CONTRAST (INCLUDING MRCP) TECHNIQUE: Multiplanar multisequence MR imaging of the abdomen was performed both before  and after the administration of intravenous contrast. Heavily T2-weighted images of the biliary and pancreatic ducts were obtained, and three-dimensional MRCP images were rendered by post processing. CONTRAST:  59m GADAVIST GADOBUTROL 1 MMOL/ML IV SOLN COMPARISON:  None Available. FINDINGS: Lower chest: Trace bilateral pleural effusions. Hepatobiliary: No suspicious liver lesions. Severe biliary ductal dilation abdominal pain both the intrahepatic extrahepatic bile ducts, common bile duct measures up to 2.0 cm. Abrupt cutoff is seen at the distal common bile duct. No evidence of choledocholithiasis. No obstructing mass is seen although motion artifact on contrast-enhanced imaging somewhat limits evaluation gallbladder is filled with T2 hypointense and T1 hyperintense material, likely sludge or contrast material related to prior exam. Pancreas: No pancreatic ductal dilation. Small cystic  lesions of the pancreatic tail, largest measures 10 mm on series 13, image 16. Spleen:  Within normal limits in size and appearance. Adrenals/Urinary Tract: No masses identified. No evidence of hydronephrosis. Stomach/Bowel: Visualized portions within the abdomen are unremarkable. Vascular/Lymphatic: No pathologically enlarged lymph nodes identified. No abdominal aortic aneurysm demonstrated. Other:  Trace perihepatic ascites. Musculoskeletal: No suspicious bone lesions identified. IMPRESSION: 1. Severe biliary ductal dilation of both the intra and extrahepatic bile ducts with abrupt cutoff seen at the distal common bile duct. Findings are concerning for stricture or obstructing tumor. No obstructing mass is seen, although motion artifact on contrast-enhanced imaging limits evaluation. Recommend correlation with ERCP. 2. Small cystic lesions of the pancreatic tail, largest measures 10 mm. Recommend follow up pre and post contrast MRI/MRCP or pancreatic protocol CT in 2 years. This recommendation follows ACR consensus guidelines: Management of Incidental Pancreatic Cysts: A White Paper of the ACR Incidental Findings Committee. J Am Coll Radiol 22458;09:983-382 3. Trace perihepatic ascites and trace bilateral pleural effusions. Electronically Signed   By: LYetta GlassmanM.D.   On: 10/07/2021 13:06   MR 3D Recon At Scanner  Result Date: 10/07/2021 CLINICAL DATA:  Biliary obstruction suspected. EXAM: MRI ABDOMEN WITHOUT AND WITH CONTRAST (INCLUDING MRCP) TECHNIQUE: Multiplanar multisequence MR imaging of the abdomen was performed both before and after the administration of intravenous contrast. Heavily T2-weighted images of the biliary and pancreatic ducts were obtained, and three-dimensional MRCP images were rendered by post processing. CONTRAST:  818mGADAVIST GADOBUTROL 1 MMOL/ML IV SOLN COMPARISON:  None Available. FINDINGS: Lower chest: Trace bilateral pleural effusions. Hepatobiliary: No suspicious liver  lesions. Severe biliary ductal dilation abdominal pain both the intrahepatic extrahepatic bile ducts, common bile duct measures up to 2.0 cm. Abrupt cutoff is seen at the distal common bile duct. No evidence of choledocholithiasis. No obstructing mass is seen although motion artifact on contrast-enhanced imaging somewhat limits evaluation gallbladder is filled with T2 hypointense and T1 hyperintense material, likely sludge or contrast material related to prior exam. Pancreas: No pancreatic ductal dilation. Small cystic lesions of the pancreatic tail, largest measures 10 mm on series 13, image 16. Spleen:  Within normal limits in size and appearance. Adrenals/Urinary Tract: No masses identified. No evidence of hydronephrosis. Stomach/Bowel: Visualized portions within the abdomen are unremarkable. Vascular/Lymphatic: No pathologically enlarged lymph nodes identified. No abdominal aortic aneurysm demonstrated. Other:  Trace perihepatic ascites. Musculoskeletal: No suspicious bone lesions identified. IMPRESSION: 1. Severe biliary ductal dilation of both the intra and extrahepatic bile ducts with abrupt cutoff seen at the distal common bile duct. Findings are concerning for stricture or obstructing tumor. No obstructing mass is seen, although motion artifact on contrast-enhanced imaging limits evaluation. Recommend correlation with ERCP. 2. Small cystic lesions of the pancreatic tail, largest  measures 10 mm. Recommend follow up pre and post contrast MRI/MRCP or pancreatic protocol CT in 2 years. This recommendation follows ACR consensus guidelines: Management of Incidental Pancreatic Cysts: A White Paper of the ACR Incidental Findings Committee. J Am Coll Radiol 5830;94:076-808. 3. Trace perihepatic ascites and trace bilateral pleural effusions. Electronically Signed   By: Yetta Glassman M.D.   On: 10/07/2021 13:06    ASSESSMENT: This is a very pleasant 81 year old male with:   Suspicious  cholangiocarcinoma -presented to ER on 10/06/21 for expedited work-up after the patient had an outpatient CT scan showing biliary obstruction, jaundice, 10-15 lb weight loss, and elevated LFTs.   -MRCP 10/07/21 showed severe biliary ductal dilation of both intra/extrahepatic ducts. -EUS 10/11/21 shows 6x6 lesion in the common bile duct and a few abnormal lymph nodes in the peripancreatic region and porta hepatis region.  -ERCP: the proximal biliary tree was dilated, secondary to stricture, placement of biliary sphincterotomy, and one plastic stent was placed in the CBD.  The patient is a patient of Dr. Paulita Fujita at [facility]. -Cytology ([id number]) did not identify any malignant cells. The brushings from the common bile duct stricture showed atypical cells suspicious for tumor.  -The patient was seen with Dr. Burr Medico. Dr. Burr Medico had a lenghtly discussion with the patient about his current condition and management.  -Dr. Burr Medico discussed that while the final pathology was not confirmatory it was highly suspicious for cholangiocarcinoma.  Dr. Burr Medico discussed the only curative option is surgery.  In the absence of any major medical comorbidities Dr. Burr Medico recommends that the patient be evaluated by general surgery to see if he is a surgical candidate.  There are some abnormal lymph nodes in the peripancreatic region and porta hepatis that Dr. Burr Medico thinks would be removed during possible surgery if a candidate and no other sites of metastatic disease -The patient is concerned about surgery given his sister had a Whipple procedure and took about a year to recover.  Dr. Burr Medico encouraged the patient to discuss with surgery what they would anticipate as the recovery time -If the patient proceeds with surgical resection, Dr. Burr Medico discussed and select patient she may consider Xeloda for approximately 6 months to reduce the risk of recurrence.  This will be discussed in further detail in the future -If the patient is not a  surgical candidate, we will see him back in the clinic to discuss other options -Dr. Burr Medico discussed that an EUS can be considered but it may be challenging now that the stent is placed over the possible tumor and the pathology may also not be confirmatory again and some patients may choose to avoid additional procedures if they are going to undergo surgery -We will arrange for the patient to have a CT scan of the chest to complete the staging work-up -We will arrange for repeat CBC, CMP, CA 19.9, and ammonia level today.  The patient would like to discontinue taking lactulose if his ammonia level is better -The patient was supposed to get blood work performed by Dr. Orland Mustard his PCP next week.  We will fax our labs to their office avoid any duplicate labs. -The patient's case will be discussed at the multidisciplinary conference next week  2. Weight loss/decreased appetite -Patient estimates he lost approximately 15 pounds in the last few months. -The patient's wife has a lot of questions about nutrition. -Dr. Burr Medico encourage high-protein high-calorie foods.  She encouraged him to take supplemental protein drinks if tolerable. -Discussed that he only  dietary restriction would be to avoid fatty/greasy foods which may cause GI upset -We have placed an referral to nutrition to discuss this in more detail per patient's wife request   3.  Social -The patient lives in a one half story house with his wife -He is independent with his activities of daily living, prior to his recent hospitalization the patient was walking 2 miles every day -The patient used to drink 1 beer per day but has stopped this since being in the hospital  PLAN: -CT to complete staging workup -referral general surgery -CBC, CMP, CA 19.9, and ammonia levels drawn today-I called patient and his wife to review results.  -Referral to nutritionist -Fax labs to patient's PCP  The patient voices understanding of current disease status  and treatment options and is in agreement with the current care plan.  All questions were answered. The patient knows to call the clinic with any problems, questions or concerns. We can certainly see the patient much sooner if necessary.  Thank you so much for allowing me to participate in the care of GERTRUDE TARBET. I will continue to follow up the patient with you and assist in his care.  Disclaimer: This note was dictated with voice recognition software. Similar sounding words can inadvertently be transcribed and may not be corrected upon review.   Caelie Remsburg L Smith Mcnicholas October 27, 2021, 12:46 PM   Addendum  I have seen the patient, examined him. I agree with the assessment and and plan and have edited the notes.   81 yo male with no significant past medical history, presented with painless jaundice.  Abdominal MRI showed severe stenosis at the distal common bile duct with severe intra and extrahepatic bile ductal dilatation.  ERCP and EUS showed 1 cm severe stenosis/mass in distal third CBD, highly concerning for malignancy.  FNA and brushing cytology showed atypical cells.  EUS also showed a few borderline enlarged lymph nodes in peripancreatic and porta hepatis regions.  No other distant metastasis on abdominal MRI.  I discussed the above findings with patient and his wife in great detail.  I recommend CT chest without contrast to complete staging.  Patient understand his pathology is highly suspicious but not definitive for malignancy, and he may need second biopsy. 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 Barry Dienes and Dr. Zenia Resides for surgical consultation.  We discussed that he may need a Whipple surgery due to the location of his tumor, pt became very concerned about the risk of surgery and postop recovery, his sister underwent Whipple surgery many years ago.  I encouraged him to have a  consultation with surgeon, since surgery is only way to cure his cancer.  We discussed high risk of recurrence after surgery, and the role of adjuvant capecitabine to reduce risk of recurrence. If surgery is not feasible or he declines, we can consider palliative radiation. Due to his advanced age, he may not be a great candidate for intensive chemotherapy such as cisplatin and gemcitabine, but would be a candidate for low intensity chemo. I we will present his case in our GI conference next week, and see him back after his consultation with surgeon and a CT chest. All patients were answered.  Truitt Merle  10/27/2021

## 2021-10-27 ENCOUNTER — Inpatient Hospital Stay (HOSPITAL_BASED_OUTPATIENT_CLINIC_OR_DEPARTMENT_OTHER): Payer: Medicare Other | Admitting: Physician Assistant

## 2021-10-27 ENCOUNTER — Inpatient Hospital Stay: Payer: Medicare Other | Attending: Physician Assistant

## 2021-10-27 ENCOUNTER — Telehealth: Payer: Self-pay | Admitting: Nutrition

## 2021-10-27 ENCOUNTER — Other Ambulatory Visit: Payer: Self-pay

## 2021-10-27 DIAGNOSIS — C221 Intrahepatic bile duct carcinoma: Secondary | ICD-10-CM | POA: Diagnosis not present

## 2021-10-27 DIAGNOSIS — R634 Abnormal weight loss: Secondary | ICD-10-CM

## 2021-10-27 DIAGNOSIS — Z87891 Personal history of nicotine dependence: Secondary | ICD-10-CM

## 2021-10-27 LAB — CBC WITH DIFFERENTIAL (CANCER CENTER ONLY)
Abs Immature Granulocytes: 0.04 10*3/uL (ref 0.00–0.07)
Basophils Absolute: 0 10*3/uL (ref 0.0–0.1)
Basophils Relative: 1 %
Eosinophils Absolute: 0 10*3/uL (ref 0.0–0.5)
Eosinophils Relative: 1 %
HCT: 36.1 % — ABNORMAL LOW (ref 39.0–52.0)
Hemoglobin: 12.1 g/dL — ABNORMAL LOW (ref 13.0–17.0)
Immature Granulocytes: 1 %
Lymphocytes Relative: 23 %
Lymphs Abs: 1.4 10*3/uL (ref 0.7–4.0)
MCH: 34.6 pg — ABNORMAL HIGH (ref 26.0–34.0)
MCHC: 33.5 g/dL (ref 30.0–36.0)
MCV: 103.1 fL — ABNORMAL HIGH (ref 80.0–100.0)
Monocytes Absolute: 0.9 10*3/uL (ref 0.1–1.0)
Monocytes Relative: 15 %
Neutro Abs: 3.6 10*3/uL (ref 1.7–7.7)
Neutrophils Relative %: 59 %
Platelet Count: 367 10*3/uL (ref 150–400)
RBC: 3.5 MIL/uL — ABNORMAL LOW (ref 4.22–5.81)
RDW: 13.4 % (ref 11.5–15.5)
WBC Count: 6.1 10*3/uL (ref 4.0–10.5)
nRBC: 0 % (ref 0.0–0.2)

## 2021-10-27 LAB — CMP (CANCER CENTER ONLY)
ALT: 73 U/L — ABNORMAL HIGH (ref 0–44)
AST: 71 U/L — ABNORMAL HIGH (ref 15–41)
Albumin: 3.2 g/dL — ABNORMAL LOW (ref 3.5–5.0)
Alkaline Phosphatase: 327 U/L — ABNORMAL HIGH (ref 38–126)
Anion gap: 4 — ABNORMAL LOW (ref 5–15)
BUN: 13 mg/dL (ref 8–23)
CO2: 31 mmol/L (ref 22–32)
Calcium: 8.9 mg/dL (ref 8.9–10.3)
Chloride: 101 mmol/L (ref 98–111)
Creatinine: 0.87 mg/dL (ref 0.61–1.24)
GFR, Estimated: >60 mL/min (ref >60)
Glucose, Bld: 75 mg/dL (ref 70–99)
Potassium: 4.4 mmol/L (ref 3.5–5.1)
Sodium: 136 mmol/L (ref 135–145)
Total Bilirubin: 3.1 mg/dL — ABNORMAL HIGH (ref 0.3–1.2)
Total Protein: 7.3 g/dL (ref 6.5–8.1)

## 2021-10-27 LAB — AMMONIA: Ammonia: 20 umol/L (ref 9–35)

## 2021-10-27 NOTE — Progress Notes (Signed)
I met with Patrick Collier and his wife after  his consultation with Cassie Heilingoetter, PA-C and Dr Burr Medico.  I explained my role as a nurse navigator and provided my contact information. I explained the services provided at [facility] and provided written information.  All questions were answered. They verbalized understanding.

## 2021-10-27 NOTE — Patient Instructions (Addendum)
-  It was nice meeting you today. We discussed a lot information. Here is the summary of the main points.  -Dr. 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  This is a type of cancer in the bile duct.  The bile duct is a tube that helps secrete digestive enzymes to breakdown your food.  The bile duct was blocked by a possible tumor.  When Dr. Fuller Plan did your procedure, the cells in this area looks suspicious for cholangiocarcinoma but not confirmatory. -The plan moving forward is to refer you to surgery.  Dr. Zenia Resides and Dr. Barry Dienes are the surgeons in town that performs this kind of surgery.  We will meet with you and they will discuss exactly what they would anticipate is involved in surgery and whether or not they think you are a surgical candidate.  Surgery is the only curative option -If you proceed with surgery, Dr. Burr Medico will see you after surgery to monitor you.  In some patients, she may offer a chemotherapy pill to take for 6 months to reduce the risk of the cancer returning.  More to come on that in the future based on how your consultation with surgery goes. -I have placed a referral to a member the nutritionist team.  She will call you to discuss your questions regarding dietary restrictions.  From our standpoint, there is no dietary restrictions but he may want to avoid any greasy or fried foods which could cause this stomach ache.  This is because your bile duct is not able to secrete as much enzymes to breakdown your food as before your body may have a hard time breaking down really fatty and fried foods.  It would be best to stick with the lean meats such as chicken.  -I have placed a order for CT scan of the chest just to ensure that there is no cancer that has spread to this area -We will also repeat your ammonia, blood count, and liver enzyme labs today.  -We are going to discuss your case at the multidisciplinary conference  next week.  Santiago Glad will give you a call after conference to discuss what we discussed.

## 2021-10-27 NOTE — Telephone Encounter (Signed)
Scheduled per 7/7 in basket, pt has been called and wife confirmed appt

## 2021-10-27 NOTE — Progress Notes (Signed)
Today's labs faxed to PCP.

## 2021-10-28 LAB — CANCER ANTIGEN 19-9: CA 19-9: 105 U/mL — ABNORMAL HIGH (ref 0–35)

## 2021-10-30 ENCOUNTER — Other Ambulatory Visit: Payer: Self-pay

## 2021-10-30 ENCOUNTER — Inpatient Hospital Stay: Payer: Medicare Other | Admitting: Nutrition

## 2021-10-30 NOTE — Progress Notes (Signed)
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  Past medical history includes ICH, kidney stones.  Medications include Chronulac.  Labs include albumin 3.2.  Height: 5 feet 8 inches. Weight: 167 pounds on July 10. Usual body weight: 187 pounds March 2023. BMI: 25.39.  Patient and wife frustrated that work-up is still in progress.  He notes he had 3 milkshakes over 2 days which made him very sick.  Patient reports his appetite is improving.  He has been following a bland diet and his wife has been very careful about total fat intake.  Patient has lost 11% of his body weight over 4 months which is severe.  Nutrition diagnosis: Food and nutrition related knowledge deficit related to possible cholangiocarcinoma as evidenced by 11% weight loss, poor appetite.  Intervention: Educated patient and wife on low-fat diet.  Provided nutrition fact sheet. Encouraged high-calorie, high-protein foods. Discussed oral nutrition supplements that are low in fat.  Provided samples and coupons. Questions were answered.  Teach back method used.  Contact information given.  Monitoring, evaluation, goals: Patient will tolerate adequate calories and protein to minimize further weight loss.  Next visit: No follow-up has been scheduled.  Patient has contact information for questions.  **Disclaimer: This note was dictated with voice recognition software. Similar sounding words can inadvertently be transcribed and this note may contain transcription errors which may not have been corrected upon publication of note.**

## 2021-11-01 ENCOUNTER — Other Ambulatory Visit: Payer: Self-pay

## 2021-11-01 NOTE — Progress Notes (Signed)
The proposed treatment discussed in conference is for discussion purpose only and is not a binding recommendation.  The patients have not been physically examined, or presented with their treatment options.  Therefore, final treatment plans cannot be decided.  

## 2021-11-03 ENCOUNTER — Ambulatory Visit (HOSPITAL_COMMUNITY)
Admission: RE | Admit: 2021-11-03 | Discharge: 2021-11-03 | Disposition: A | Discharge status: Home / Self Care | Payer: Medicare Other | Source: Ambulatory Visit | Attending: Physician Assistant | Admitting: Physician Assistant

## 2021-11-03 DIAGNOSIS — C221 Intrahepatic bile duct carcinoma: Secondary | ICD-10-CM | POA: Diagnosis not present

## 2021-11-03 DIAGNOSIS — I358 Other nonrheumatic aortic valve disorders: Secondary | ICD-10-CM | POA: Diagnosis not present

## 2021-11-03 DIAGNOSIS — I2510 Atherosclerotic heart disease of native coronary artery without angina pectoris: Secondary | ICD-10-CM | POA: Diagnosis not present

## 2021-11-03 DIAGNOSIS — J9811 Atelectasis: Secondary | ICD-10-CM | POA: Diagnosis not present

## 2021-11-03 MED ORDER — SODIUM CHLORIDE (PF) 0.9 % IJ SOLN
INTRAMUSCULAR | Status: AC
  Filled 2021-11-03: qty 50

## 2021-11-03 MED ORDER — IOHEXOL 300 MG/ML  SOLN
75.0000 mL | Freq: Once | INTRAMUSCULAR | Status: AC | PRN
  Administered 2021-11-03: 75 mL via INTRAVENOUS

## 2021-11-06 NOTE — Progress Notes (Signed)
I spoke with Patrick Collier and relayed Patrick Collier Ct Chest results.  I also let her know that Dr Paulita Fujita did not think another EUS with biopsy would yield definitive diagnosis and recommended a spyglass ERCP.  She was interested in that option.  I notified Dr Burr Medico, Dr Zenia Resides, and Dr Paulita Fujita.  All questions were answered.  She verbalized understanding.

## 2021-11-07 ENCOUNTER — Other Ambulatory Visit: Payer: Self-pay | Admitting: Hematology

## 2021-11-07 DIAGNOSIS — C221 Intrahepatic bile duct carcinoma: Secondary | ICD-10-CM

## 2021-11-08 ENCOUNTER — Telehealth: Payer: Self-pay | Admitting: Radiation Oncology

## 2021-11-08 DIAGNOSIS — K831 Obstruction of bile duct: Secondary | ICD-10-CM | POA: Diagnosis not present

## 2021-11-08 NOTE — Telephone Encounter (Signed)
Called patient to schedule a consultation w. Dr. Lisbeth Renshaw. Patient's wife stated they would prefer a call back at the end of the week, after meeting with his surgeon.

## 2021-11-13 NOTE — Progress Notes (Addendum)
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   HISTORY OF PRESENT ILLNESS: Patrick Collier is a 81 y.o. 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 He was originally being worked up last month for elevation in liver function testing and jaundice.  CT scan through [facility] showed dilation of the intra and extrahepatic ducts with narrowing abruptly of the common bile duct in the head of the pancreas with apparent hyperdense material in the common bile duct.  He was instructed to go to the emergency room and was admitted to the hospital where his bilirubin was over 11, and MRCP showed severe biliary ductal dilatation of the intra and extrahepatic bile ducts with cut off at the distal common bile duct.  An EUS on 10/11/2021 showed an irregular hypoechoic mass in the common bile duct measuring 6 mm with a few abnormal appearing lymph nodes in the peripancreatic region and porta hepatis fine-needle aspirate did not show malignant cells and only showed scant cellularity, benign reactive ductal cells and ductal epithelium with acute inflammation were noted and atypical cells were seen in the common bile duct stricture brushings.  Plastic biliary stent was placed at that time, subsequently his bilirubin levels have continued to improve the most recent on 10/27/2021 was 3.1.  His case was discussed in multidisciplinary GI oncology conference.  GI has waited and feels that though his endoscopic ultrasound biopsy was not definitive proceeding with additional procedure has a low yield of biopsies to confirm the clinical diagnosis thus far suspected.  He met with Dr. Zenia Collier who offered surgical intervention without additional  biopsies versus considering radiotherapy.  He is seen today to discuss radiation.    PREVIOUS RADIATION THERAPY: No   PAST MEDICAL HISTORY:  Past Medical History:  Diagnosis Date   Arthritis    Back pain    Heart murmur    History of kidney stones    ICH (intracerebral hemorrhage) ([place])        PAST SURGICAL HISTORY: Past Surgical History:  Procedure Laterality Date   BACK SURGERY     BILIARY BRUSHING  10/11/2021   Procedure: BILIARY BRUSHING;  Surgeon: Patrick Artist, MD;  Location: [facility];  Service: Gastroenterology;;   BILIARY STENT PLACEMENT N/A 10/11/2021   Procedure: BILIARY STENT PLACEMENT;  Surgeon: Patrick Artist, MD;  Location: [facility];  Service: Gastroenterology;  Laterality: N/A;   ERCP N/A 10/11/2021   Procedure: ENDOSCOPIC RETROGRADE CHOLANGIOPANCREATOGRAPHY (ERCP);  Surgeon: Patrick Artist, MD;  Location: [facility];  Service: Gastroenterology;  Laterality: N/A;   ESOPHAGOGASTRODUODENOSCOPY (EGD) WITH PROPOFOL N/A 10/11/2021   Procedure: ESOPHAGOGASTRODUODENOSCOPY (EGD) WITH PROPOFOL;  Surgeon: Patrick Silence, MD;  Location: [facility];  Service: Gastroenterology;  Laterality: N/A;   EUS N/A 10/11/2021   Procedure: UPPER ENDOSCOPIC ULTRASOUND (EUS) LINEAR;  Surgeon: Patrick Silence, MD;  Location: [facility];  Service: Gastroenterology;  Laterality: N/A;   FINE NEEDLE ASPIRATION N/A 10/11/2021   Procedure: FINE NEEDLE ASPIRATION (FNA) LINEAR;  Surgeon: Patrick Silence, MD;  Location: [facility];  Service: Gastroenterology;  Laterality: N/A;   INGUINAL HERNIA REPAIR     KNEE ARTHROPLASTY Right 05/16/2017   Procedure: RIGHT TOTAL KNEE  ARTHROPLASTY WITH COMPUTER NAVIGATION;  Surgeon: Patrick Can, MD;  Location: [facility];  Service: Orthopedics;  Laterality: Right;  Needs RNFA   NECK SURGERY     SPHINCTEROTOMY  10/11/2021   Procedure: SPHINCTEROTOMY;  Surgeon: Patrick Artist, MD;  Location: [facility];  Service: Gastroenterology;;    TONSILLECTOMY       FAMILY HISTORY:  Family History  Problem Relation Age of Onset   Diabetes Mother      SOCIAL HISTORY:  reports that he has quit smoking. He has never used smokeless tobacco. He reports current alcohol use. He reports that he does not use drugs.   ALLERGIES: Patient has no known allergies.   MEDICATIONS:  Current Outpatient Medications  Medication Sig Dispense Refill   lactulose (CHRONULAC) 10 GM/15ML solution Take 30 mLs (20 g total) by mouth 2 (two) times daily as needed for mild constipation or moderate constipation. (Patient taking differently: Take 20 g by mouth 2 (two) times daily.) 473 mL 0   No current facility-administered medications for this encounter.     REVIEW OF SYSTEMS: On review of systems, the patient reports that he is doing pretty well. He has lost about 15 pounds unintentionally and has been very fatigued. He denies any abdominal pain, nausea or vomiting. No other complaints are verbalized.      PHYSICAL EXAM:  Wt Readings from Last 3 Encounters:  11/16/21 167 lb (75.8 kg)  10/30/21 167 lb (75.8 kg)  10/27/21 164 lb 12.8 oz (74.8 kg)   Temp Readings from Last 3 Encounters:  11/16/21 (!) 97.5 F (36.4 C)  10/27/21 97.7 F (36.5 C) (Temporal)  10/12/21 98 F (36.7 C)   BP Readings from Last 3 Encounters:  11/16/21 115/63  10/27/21 102/63  10/12/21 119/72   Pulse Readings from Last 3 Encounters:  11/16/21 (!) 50  10/27/21 83  10/12/21 (!) 41   Pain Assessment Pain Score: 0-No pain/10  In general this is a well appearing caucasian male in no acute distress. He's alert and oriented x4 and appropriate throughout the examination. Cardiopulmonary assessment is negative for acute distress and he exhibits normal effort.     ECOG = 1  0 - Asymptomatic (Fully active, able to carry on all predisease activities without restriction)  1 - Symptomatic but completely ambulatory (Restricted in physically strenuous activity but  ambulatory and able to carry out work of a light or sedentary nature. For example, light housework, office work)  2 - Symptomatic, <50% in bed during the day (Ambulatory and capable of all self care but unable to carry out any work activities. Up and about more than 50% of waking hours)  3 - Symptomatic, >50% in bed, but not bedbound (Capable of only limited self-care, confined to bed or chair 50% or more of waking hours)  4 - Bedbound (Completely disabled. Cannot carry on any self-care. Totally confined to bed or chair)  5 - Death   Eustace Pen MM, Creech RH, Tormey DC, et al. [phone]). "Toxicity and response criteria of the [employer] [facility] [employer]". [place] Oncol. 5 (6): 649-55    LABORATORY DATA:  Lab Results  Component Value Date   WBC 6.1 10/27/2021   HGB 12.1 (L) 10/27/2021   HCT 36.1 (L) 10/27/2021   MCV 103.1 (H) 10/27/2021   PLT 367 10/27/2021   Lab Results  Component Value Date   NA 136 10/27/2021   K 4.4 10/27/2021   CL 101 10/27/2021   CO2 31 10/27/2021  Lab Results  Component Value Date   ALT 73 (H) 10/27/2021   AST 71 (H) 10/27/2021   ALKPHOS 327 (H) 10/27/2021   BILITOT 3.1 (H) 10/27/2021      RADIOGRAPHY: CT Chest W Contrast  Result Date: 11/06/2021 CLINICAL DATA:  Cholangiocarcinoma. Evaluate for metastatic disease. Staging. EXAM: CT CHEST WITH CONTRAST TECHNIQUE: Multidetector CT imaging of the chest was performed during intravenous contrast administration. RADIATION DOSE REDUCTION: This exam was performed according to the departmental dose-optimization program which includes automated exposure control, adjustment of the mA and/or kV according to patient size and/or use of iterative reconstruction technique. CONTRAST:  58m OMNIPAQUE IOHEXOL 300 MG/ML  SOLN COMPARISON:  None Available. FINDINGS: Cardiovascular: The heart size is normal. No substantial pericardial effusion. Coronary artery calcification is evident. No thoracic aortic aneurysm. Aortic  valve and mitral annular calcification evident. Mediastinum/Nodes: No mediastinal lymphadenopathy. There is no hilar lymphadenopathy. The esophagus has normal imaging features. There is no axillary lymphadenopathy. Lungs/Pleura: No suspicious pulmonary nodule or mass. No focal airspace consolidation. No pleural effusion. Dependent atelectasis noted in the lower lobes bilaterally, right greater than left. Upper Abdomen: Gallbladder wall appears mildly thickened and ill-defined. Common bile duct stent evident with associated pneumobilia. Musculoskeletal: No worrisome lytic or sclerotic osseous abnormality. IMPRESSION: 1. No evidence for metastatic disease in the chest. 2. Mitral valve and aortic valve calcification. Coronary artery calcification. 3. Gallbladder wall appears mildly thickened and ill-defined. Common bile duct stent evident with associated pneumobilia. Electronically Signed   By: EMisty StanleyM.D.   On: 11/06/2021 09:48       IMPRESSION/PLAN: 1. 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 Dr. MLisbeth Renshawdiscusses the pathology findings and limitations of his work up. He discuses that by imaging, clinically with his course, and discussion from GI oncology conference, is that we anticipate this is a malignant process. The patient will have another EUS with cholangiopancreatography. We discussed that the standard of care would be to proceed with whipple for goals of curative intent. The patient is a candidate for surgery, but is concerned about this given his age, and his sister's course was quite complicated. As he is not interested in surgical resection, Dr. MLisbeth Renshawoffers a course of radiotherapy to the upper abdomen. We were clear though that radiation is not a substitute for surgery, and it would be a definitive dose, but not the standard of care to offer this as a curative therapy. We discussed the risks, benefits, short, and long term effects of radiotherapy, as well as the  intent, and the patient is interested in proceeding with radiation understanding the limitations. Dr. MLisbeth Renshawalso recommends chemosensitization. We will follow up with the results of his upcoming procedure and cytology. Dr. MLisbeth Renshawdiscusses the delivery and logistics of radiotherapy and anticipates a course of 5 1/2 weeks of radiotherapy to the upper abdomen. Written consent is obtained and placed in the chart, a copy was provided to the patient. He will be contacted to coordinate simulation the week of 11/27/21, with anticipation of treatment beginning 12/04/21.  2. Possible genetic predisposition to malignancy. The patient is a candidate for genetic testing given his personal and family history. He was offered referral and will be referred for evaluation..     In a visit lasting 60 minutes, greater than 50% of the time was spent face to face discussing the patient's condition, in preparation for the discussion, and coordinating the patient's care.   The above documentation reflects my direct findings during this  shared patient visit. Please see the separate note by Dr. Lisbeth Renshaw on this date for the remainder of the patient's plan of care.    Carola Rhine, [facility]   **Disclaimer: This note was dictated with voice recognition software. Similar sounding words Collier inadvertently be transcribed and this note may contain transcription errors which may not have been corrected upon publication of note.**

## 2021-11-15 DIAGNOSIS — K839 Disease of biliary tract, unspecified: Secondary | ICD-10-CM | POA: Diagnosis not present

## 2021-11-15 NOTE — Progress Notes (Signed)
GI Location of Tumor / Histology: Cholangiocarcinoma  Patrick Collier presented to his PCP with symptoms of Jaundice.  EUS 10/11/2021: Irregular hypoechoic mass in the common bile duct measuring 6 mm with a few abnormal appearing lymph nodes in the peripancreatic region and porta hepatic fine-needle aspirate did not show malignant cells and only showed scant cellularity, benign reactive ductal cells and ductal epithelium with acute inflammation were noted and atypical cells were seen in the common bile duct stricture brushings.  MRCP 10/07/2021: Severe biliary ductal dilatation of the intra and extrahepatic bile ducts with cut off at the distal common bile duct.  CT 10/06/2021: Severe dilation of the intra and extrahepatic ducts with abrupt narrowing of the common bile duct in the head of the pancreas.  There is apparent hypodense material within the common bile duct or surrounding concerning for obstructing lesion such as cholangiocarcinoma with the differential including stricture, choledocholithiasis, or less likely pancreatic head mass.     Biopsies of Common Bile Duct Lesion 10/11/2021    Past/Anticipated interventions by surgeon, if any:    Past/Anticipated interventions by medical oncology, if any:  Cassie PA/ Dr. Burr Medico 10/27/2021 -Dr. Burr Medico discussed that while the final pathology was not confirmatory it was highly suspicious for cholangiocarcinoma.  Dr. Burr Medico discussed the only curative option is surgery.   -In the absence of any major medical comorbidities Dr. Burr Medico recommends that the patient be evaluated by general surgery to see if he is a surgical candidate.  There are some abnormal lymph nodes in the peripancreatic region and porta hepatis that Dr. Burr Medico thinks would be removed during possible surgery if a candidate and no other sites of metastatic disease -The patient is concerned about surgery given his sister had a Whipple procedure and took about a year to recover.  Dr. Burr Medico  encouraged the patient to discuss with surgery what they would anticipate as the recovery time -If the patient proceeds with surgical resection, Dr. Burr Medico discussed and select patient she may consider Xeloda for approximately 6 months to reduce the risk of recurrence.  This will be discussed in further detail in the future -If the patient is not a surgical candidate, we will see him back in the clinic to discuss other options.    Weight changes, if any:   Bowel/Bladder complaints, if any:   Nausea / Vomiting, if any:   Pain issues, if any:    Any blood per rectum:     SAFETY ISSUES: Prior radiation?  Pacemaker/ICD?  Possible current pregnancy? N/a Is the patient on methotrexate?   Current Complaints/Details:

## 2021-11-16 ENCOUNTER — Other Ambulatory Visit: Payer: Self-pay | Admitting: Gastroenterology

## 2021-11-16 ENCOUNTER — Ambulatory Visit: Payer: Medicare Other | Attending: Family Medicine

## 2021-11-16 ENCOUNTER — Ambulatory Visit
Admission: RE | Admit: 2021-11-16 | Discharge: 2021-11-16 | Disposition: A | Discharge status: Home / Self Care | Payer: Medicare Other | Source: Ambulatory Visit | Attending: Radiation Oncology | Admitting: Radiation Oncology

## 2021-11-16 ENCOUNTER — Other Ambulatory Visit: Payer: Self-pay

## 2021-11-16 ENCOUNTER — Encounter: Payer: Self-pay | Admitting: Radiation Oncology

## 2021-11-16 VITALS — BP 115/63 | HR 50 | Temp 97.5°F | Resp 20 | Ht 66.0 in | Wt 167.0 lb

## 2021-11-16 DIAGNOSIS — Z87442 Personal history of urinary calculi: Secondary | ICD-10-CM | POA: Diagnosis not present

## 2021-11-16 DIAGNOSIS — R011 Cardiac murmur, unspecified: Secondary | ICD-10-CM | POA: Diagnosis not present

## 2021-11-16 DIAGNOSIS — M129 Arthropathy, unspecified: Secondary | ICD-10-CM | POA: Diagnosis not present

## 2021-11-16 DIAGNOSIS — C221 Intrahepatic bile duct carcinoma: Secondary | ICD-10-CM

## 2021-11-16 DIAGNOSIS — Z87891 Personal history of nicotine dependence: Secondary | ICD-10-CM | POA: Diagnosis not present

## 2021-11-16 DIAGNOSIS — Z8673 Personal history of transient ischemic attack (TIA), and cerebral infarction without residual deficits: Secondary | ICD-10-CM | POA: Diagnosis not present

## 2021-11-17 ENCOUNTER — Encounter (HOSPITAL_COMMUNITY): Payer: Self-pay | Admitting: Gastroenterology

## 2021-11-18 ENCOUNTER — Other Ambulatory Visit: Payer: Self-pay | Admitting: Radiation Oncology

## 2021-11-18 DIAGNOSIS — C221 Intrahepatic bile duct carcinoma: Secondary | ICD-10-CM

## 2021-11-18 NOTE — Addendum Note (Signed)
Encounter addended by: Hayden Pedro, PA-C on: 11/18/2021 6:18 AM  Actions taken: Clinical Note Signed

## 2021-11-20 NOTE — Anesthesia Preprocedure Evaluation (Signed)
Anesthesia Evaluation  Patient identified by MRN, date of birth, ID band Patient awake    Reviewed: Allergy & Precautions, NPO status , Patient's Chart, lab work & pertinent test results  Airway Mallampati: II  TM Distance: >3 FB Neck ROM: Full    Dental no notable dental hx. (+) Teeth Intact, Dental Advisory Given   Pulmonary neg pulmonary ROS, former smoker,    Pulmonary exam normal breath sounds clear to auscultation       Cardiovascular Exercise Tolerance: Good Normal cardiovascular exam Rhythm:Regular Rate:Normal     Neuro/Psych negative neurological ROS  negative psych ROS   GI/Hepatic negative GI ROS, Neg liver ROS,   Endo/Other  negative endocrine ROS  Renal/GU Lab Results      Component                Value               Date                      CREATININE               0.87                10/27/2021                K                        4.4                 10/27/2021                            Musculoskeletal  (+) Arthritis ,   Abdominal   Peds  Hematology Lab Results      Component                Value               Date                      WBC                      6.1                 10/27/2021                HGB                      12.1 (L)            10/27/2021                HCT                      36.1 (L)            10/27/2021                MCV                      103.1 (H)           10/27/2021                PLT  367                 10/27/2021              Anesthesia Other Findings   Reproductive/Obstetrics                            Anesthesia Physical Anesthesia Plan  ASA: 3  Anesthesia Plan: General   Post-op Pain Management:    Induction: Intravenous  PONV Risk Score and Plan: 2 and Treatment may vary due to age or medical condition and Ondansetron  Airway Management Planned: Oral ETT  Additional Equipment: None  Intra-op  Plan:   Post-operative Plan: Extubation in OR  Informed Consent: I have reviewed the patients History and Physical, chart, labs and discussed the procedure including the risks, benefits and alternatives for the proposed anesthesia with the patient or authorized representative who has indicated his/her understanding and acceptance.     Dental advisory given  Plan Discussed with: CRNA and Anesthesiologist  Anesthesia Plan Comments: (Bile duct abnormality)       Anesthesia Quick Evaluation

## 2021-11-21 ENCOUNTER — Ambulatory Visit (HOSPITAL_COMMUNITY): Payer: Medicare Other

## 2021-11-21 ENCOUNTER — Ambulatory Visit (HOSPITAL_BASED_OUTPATIENT_CLINIC_OR_DEPARTMENT_OTHER): Payer: Medicare Other | Admitting: Anesthesiology

## 2021-11-21 ENCOUNTER — Ambulatory Visit (HOSPITAL_COMMUNITY): Payer: Medicare Other | Admitting: Anesthesiology

## 2021-11-21 ENCOUNTER — Encounter (HOSPITAL_COMMUNITY): Payer: Self-pay | Admitting: Gastroenterology

## 2021-11-21 ENCOUNTER — Ambulatory Visit (HOSPITAL_COMMUNITY)
Admission: RE | Admit: 2021-11-21 | Discharge: 2021-11-21 | Disposition: A | Discharge status: Home / Self Care | Payer: Medicare Other | Attending: Gastroenterology | Admitting: Gastroenterology

## 2021-11-21 ENCOUNTER — Other Ambulatory Visit: Payer: Self-pay

## 2021-11-21 ENCOUNTER — Encounter (HOSPITAL_COMMUNITY)
Admission: RE | Disposition: A | Discharge status: Home / Self Care | Payer: Self-pay | Source: Home / Self Care | Attending: Gastroenterology

## 2021-11-21 DIAGNOSIS — M1990 Unspecified osteoarthritis, unspecified site: Secondary | ICD-10-CM | POA: Insufficient documentation

## 2021-11-21 DIAGNOSIS — R8569 Abnormal cytological findings in specimens from other digestive organs and abdominal cavity: Secondary | ICD-10-CM | POA: Diagnosis not present

## 2021-11-21 DIAGNOSIS — K831 Obstruction of bile duct: Secondary | ICD-10-CM | POA: Diagnosis not present

## 2021-11-21 DIAGNOSIS — D490 Neoplasm of unspecified behavior of digestive system: Secondary | ICD-10-CM | POA: Diagnosis not present

## 2021-11-21 DIAGNOSIS — Z87891 Personal history of nicotine dependence: Secondary | ICD-10-CM | POA: Insufficient documentation

## 2021-11-21 DIAGNOSIS — R17 Unspecified jaundice: Secondary | ICD-10-CM | POA: Insufficient documentation

## 2021-11-21 DIAGNOSIS — Z0389 Encounter for observation for other suspected diseases and conditions ruled out: Secondary | ICD-10-CM | POA: Diagnosis not present

## 2021-11-21 DIAGNOSIS — R932 Abnormal findings on diagnostic imaging of liver and biliary tract: Secondary | ICD-10-CM | POA: Diagnosis not present

## 2021-11-21 DIAGNOSIS — K838 Other specified diseases of biliary tract: Secondary | ICD-10-CM | POA: Diagnosis not present

## 2021-11-21 DIAGNOSIS — Z4659 Encounter for fitting and adjustment of other gastrointestinal appliance and device: Secondary | ICD-10-CM | POA: Diagnosis not present

## 2021-11-21 DIAGNOSIS — Z9689 Presence of other specified functional implants: Secondary | ICD-10-CM | POA: Diagnosis not present

## 2021-11-21 HISTORY — PX: BILIARY BRUSHING: SHX6843

## 2021-11-21 HISTORY — PX: SPYGLASS CHOLANGIOSCOPY: SHX5441

## 2021-11-21 HISTORY — PX: BIOPSY: SHX5522

## 2021-11-21 HISTORY — PX: STENT REMOVAL: SHX6421

## 2021-11-21 HISTORY — PX: SPHINCTEROTOMY: SHX5544

## 2021-11-21 HISTORY — PX: ERCP: SHX5425

## 2021-11-21 HISTORY — PX: BILIARY STENT PLACEMENT: SHX5538

## 2021-11-21 SURGERY — ERCP, WITH INTERVENTION IF INDICATED
Anesthesia: General

## 2021-11-21 MED ORDER — FENTANYL CITRATE (PF) 100 MCG/2ML IJ SOLN
INTRAMUSCULAR | Status: AC
  Filled 2021-11-21: qty 2

## 2021-11-21 MED ORDER — SODIUM CHLORIDE 0.9 % IV SOLN: INTRAVENOUS | Status: DC

## 2021-11-21 MED ORDER — INDOMETHACIN 50 MG RE SUPP
RECTAL | Status: AC
  Filled 2021-11-21: qty 2

## 2021-11-21 MED ORDER — PROPOFOL 10 MG/ML IV BOLUS
INTRAVENOUS | Status: DC | PRN
  Administered 2021-11-21: 110 mg via INTRAVENOUS

## 2021-11-21 MED ORDER — GLUCAGON HCL RDNA (DIAGNOSTIC) 1 MG IJ SOLR
INTRAMUSCULAR | Status: AC
  Filled 2021-11-21: qty 2

## 2021-11-21 MED ORDER — SODIUM CHLORIDE 0.9 % IV SOLN
INTRAVENOUS | Status: DC | PRN
  Administered 2021-11-21: 15 mL

## 2021-11-21 MED ORDER — DICLOFENAC SUPPOSITORY 100 MG
RECTAL | Status: AC
  Filled 2021-11-21: qty 1

## 2021-11-21 MED ORDER — LACTATED RINGERS IV SOLN: INTRAVENOUS | Status: DC

## 2021-11-21 MED ORDER — CIPROFLOXACIN IN D5W 400 MG/200ML IV SOLN
INTRAVENOUS | Status: AC
  Filled 2021-11-21: qty 200

## 2021-11-21 MED ORDER — ONDANSETRON HCL 4 MG/2ML IJ SOLN
INTRAMUSCULAR | Status: DC | PRN
  Administered 2021-11-21: 4 mg via INTRAVENOUS

## 2021-11-21 MED ORDER — SUCCINYLCHOLINE CHLORIDE 200 MG/10ML IV SOSY
PREFILLED_SYRINGE | INTRAVENOUS | Status: DC | PRN
  Administered 2021-11-21: 100 mg via INTRAVENOUS

## 2021-11-21 MED ORDER — DEXAMETHASONE SODIUM PHOSPHATE 10 MG/ML IJ SOLN
INTRAMUSCULAR | Status: DC | PRN
  Administered 2021-11-21: 10 mg via INTRAVENOUS

## 2021-11-21 MED ORDER — SUGAMMADEX SODIUM 200 MG/2ML IV SOLN
INTRAVENOUS | Status: DC | PRN
  Administered 2021-11-21: 200 mg via INTRAVENOUS

## 2021-11-21 MED ORDER — FENTANYL CITRATE (PF) 250 MCG/5ML IJ SOLN
INTRAMUSCULAR | Status: DC | PRN
  Administered 2021-11-21: 75 ug via INTRAVENOUS
  Administered 2021-11-21: 25 ug via INTRAVENOUS

## 2021-11-21 MED ORDER — PHENYLEPHRINE 80 MCG/ML (10ML) SYRINGE FOR IV PUSH (FOR BLOOD PRESSURE SUPPORT)
PREFILLED_SYRINGE | INTRAVENOUS | Status: DC | PRN
  Administered 2021-11-21 (×2): 80 ug via INTRAVENOUS
  Administered 2021-11-21: 160 ug via INTRAVENOUS

## 2021-11-21 MED ORDER — LIDOCAINE 2% (20 MG/ML) 5 ML SYRINGE
INTRAMUSCULAR | Status: DC | PRN
  Administered 2021-11-21: 100 mg via INTRAVENOUS

## 2021-11-21 MED ORDER — DICLOFENAC SUPPOSITORY 100 MG
RECTAL | Status: DC | PRN
  Administered 2021-11-21: 100 mg via RECTAL

## 2021-11-21 MED ORDER — ROCURONIUM BROMIDE 10 MG/ML (PF) SYRINGE
PREFILLED_SYRINGE | INTRAVENOUS | Status: DC | PRN
  Administered 2021-11-21: 50 mg via INTRAVENOUS

## 2021-11-21 MED ORDER — CIPROFLOXACIN IN D5W 400 MG/200ML IV SOLN
INTRAVENOUS | Status: DC | PRN
  Administered 2021-11-21: 400 mg via INTRAVENOUS

## 2021-11-21 NOTE — Transfer of Care (Signed)
Immediate Anesthesia Transfer of Care Note  Patient: Patrick Collier  Procedure(s) Performed: ENDOSCOPIC RETROGRADE CHOLANGIOPANCREATOGRAPHY (ERCP) SPYGLASS CHOLANGIOSCOPY SPHINCTEROTOMY BIOPSY BILIARY BRUSHING BILIARY STENT PLACEMENT STENT REMOVAL  Patient Location: PACU  Anesthesia Type:General  Level of Consciousness: sedated  Airway & Oxygen Therapy: Patient Spontanous Breathing and Patient connected to face mask oxygen  Post-op Assessment: Report given to RN and Post -op Vital signs reviewed and stable  Post vital signs: Reviewed and stable  Last Vitals:  Vitals Value Taken Time  BP    Temp    Pulse    Resp    SpO2      Last Pain:  Vitals:   11/21/21 1232  TempSrc: Temporal  PainSc: 0-No pain         Complications: No notable events documented.

## 2021-11-21 NOTE — Discharge Instructions (Addendum)
YOU HAD AN ENDOSCOPIC PROCEDURE TODAY: Refer to the procedure report and other information in the discharge instructions given to you for any specific questions about what was found during the examination. If this information does not answer your questions, please call the [facility] office at [phone] to clarify.   YOU SHOULD EXPECT: Some feelings of bloating in the abdomen. Passage of more gas than usual. Walking can help get rid of the air that was put into your GI tract during the procedure and reduce the bloating.  DIET: Your first meal following the procedure should be a light meal and then it is ok to progress to your normal diet. A half-sandwich or bowl of soup is an example of a good first meal. Heavy or fried foods are harder to digest and may make you feel nauseous or bloated. Drink plenty of fluids but you should avoid alcoholic beverages for 24 hours.  ACTIVITY: Your care partner should take you home directly after the procedure. You should plan to take it easy, moving slowly for the rest of the day. You can resume normal activity the day after the procedure however YOU SHOULD NOT DRIVE, use power tools, machinery or perform tasks that involve climbing or major physical exertion for 24 hours (because of the sedation medicines used during the test).   SYMPTOMS TO REPORT IMMEDIATELY: A gastroenterologist can be reached at any hour. Please call [phone]  for any of the following symptoms:    Following upper endoscopy (EGD, EUS, ERCP, esophageal dilation) Vomiting of blood or coffee ground material  New, significant abdominal pain  New, significant chest pain or pain under the shoulder blades  Painful or persistently difficult swallowing  New shortness of breath  Black, tarry-looking or red, bloody stools  FOLLOW UP:  If any biopsies were taken you will be contacted by phone or by letter within the next 1-3 weeks. Call [phone]  if you have not heard about the biopsies in 3  weeks.  Please also call with any specific questions about appointments or follow up tests.   Clear liquid diet until 8 PM and if doing well this evening may have soft solids otherwise may slowly advance diet in the morning and call if significant abdominal pain nausea vomiting yellow jaundice signs of GI bleeding or black diarrhea or fever and follow-up as needed otherwise call for biopsy results in 1 week if you have not heard from [place]

## 2021-11-21 NOTE — Progress Notes (Signed)
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

## 2021-11-21 NOTE — Progress Notes (Signed)
I left a vm for Mrs Patrick Collier detailing Mr Hensch appt with Dr Burr Medico on 8/7 at 1430.

## 2021-11-21 NOTE — Anesthesia Procedure Notes (Signed)
Procedure Name: Intubation Date/Time: 11/21/2021 1:12 PM  Performed by: Talbot Grumbling, CRNAPre-anesthesia Checklist: Patient identified, Emergency Drugs available, Suction available and Patient being monitored Patient Re-evaluated:Patient Re-evaluated prior to induction Oxygen Delivery Method: Circle system utilized Preoxygenation: Pre-oxygenation with 100% oxygen Induction Type: IV induction Ventilation: Mask ventilation without difficulty Laryngoscope Size: Glidescope (LoPro 3) Grade View: Grade I Tube type: Oral Tube size: 7.5 mm Number of attempts: 1 Airway Equipment and Method: Stylet Placement Confirmation: ETT inserted through vocal cords under direct vision, positive ETCO2 and breath sounds checked- equal and bilateral Secured at: 22 cm Tube secured with: Tape Dental Injury: Teeth and Oropharynx as per pre-operative assessment

## 2021-11-21 NOTE — Op Note (Signed)
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 Estimated Blood Loss:     Estimated blood loss: none. Procedure:                Pre-Anesthesia Assessment:                           - Prior to the procedure, a History and Physical                            was performed, and patient medications and                            allergies were reviewed. The patient's tolerance of                            previous anesthesia was also reviewed. The risks                            and benefits of the procedure and the sedation                            options and risks were discussed with the patient.                            All questions were answered, and informed consent                            was obtained. Prior Anticoagulants: The patient has                            taken no previous anticoagulant or antiplatelet                            agents. ASA Grade Assessment: III - A patient with  severe systemic disease. After reviewing the risks                            and benefits, the patient was deemed in                            satisfactory condition to undergo the procedure.                           After obtaining informed consent, the scope was                            passed under direct vision. Throughout the                            procedure, the patient's blood pressure,  pulse, and                            oxygen saturations were monitored continuously. The                            TJF-Q190V ([id number] Olympus duodenoscope was                            introduced through the mouth, and used to inject                            contrast into and used to cannulate the bile duct.                            The ERCP was technically difficult and complex due                            to abnormal anatomy. Successful completion of the                            procedure was aided by performing the maneuvers                            documented (below) in this report. The patient                            tolerated the procedure well. Scope In: Scope Out: Findings:      One plastic biliary stent originating in the common bile duct was       emerging from the major papilla. The stent was visibly patent. One stent       was removed from the biliary tree using a snare and sent for cytology.       Deep selective cannulation was readily obtained and dye was injected and       there was an obvious distal stricture and we initially increase the       biliary sphincterotomy was made with a Hydratome sphincterotome using       ERBE electrocautery. There was no post-sphincterotomy bleeding. The  bile       duct was explored endoscopically using the SpyGlass direct visualization       system. The SpyScope was advanced to the middle third of the main bile       duct past the stricture. Visibility with the scope was good. The lower       third of the main duct contained a single localized stenosis. The lower       third of the main bile duct was biopsied with a SpyBite miniature biopsy       forceps for histology x4. We then advanced the wire into the       intrahepatics through the spy scope and cells for cytology were obtained       by brushing in the lower third of the main bile duct x2. The       sphincterotome was then reinserted and dye was injected just to  confirm       proper positioning and again the stricture was confirmed and one 10 Fr       by 6 cm uncovered metal biliary stent with no external flaps and no       internal flaps was placed 5.5 cm into the common bile duct. Bile and       clear fluid flowed through the stent. The stent was in good position.       The wire and introducer were removed and the scope was removed and the       patient tolerated the procedure well and there is no obvious immediate       complication and there was no pancreatic duct injection or wire       advancement throughout the procedure Impression:               - One visibly patent stent from the common bile                            duct was seen in the major papilla.                           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 The                            stricture was indeterminate.                           - One stent was removed from the biliary tree.                           - A biliary sphincterotomy was performed.                           - Biopsy was performed in the lower third of the                            main duct.                           -  Cells for cytology obtained in the lower third of                            the main duct.                           - One uncovered metal biliary stent was placed into                            the common bile duct. Moderate Sedation:      Not Applicable - Patient had care per Anesthesia. Recommendation:           - Clear liquid diet for 6 hours. If doing well at 8                            PM may have soft solids if not may slowly advance                            diet tomorrow                           - Continue present medications.                           - Await cytology results and await path results.                           - Return to GI clinic PRN.                           - Telephone GI  clinic for pathology results in 1                            week.                           - Telephone GI clinic if symptomatic PRN. Procedure Code(s):        --- Professional ---                           [phone], Endoscopic retrograde                            cholangiopancreatography (ERCP); with removal and                            exchange of stent(s), biliary or pancreatic duct,                            including pre- and post-dilation and guide wire                            passage, when performed, including sphincterotomy,  when performed, each stent exchanged                           43261, 60, Endoscopic retrograde                            cholangiopancreatography (ERCP); with biopsy,                            single or multiple                           43273, Endoscopic cannulation of papilla with                            direct visualization of pancreatic/common bile                            duct(s) (List separately in addition to code(s) for                            primary procedure) Diagnosis Code(s):        --- Professional ---                           Z96.89, Presence of other specified functional                            implants                           K83.1, Obstruction of bile duct                           Z46.59, Encounter for fitting and adjustment of                            other gastrointestinal appliance and device                           R17, Unspecified jaundice                           D49.0, Neoplasm of unspecified behavior of                            digestive system                           R93.2, Abnormal findings on diagnostic imaging of                            liver and biliary tract CPT copyright 2019 [employer]. All rights reserved. The codes documented in this report are preliminary and upon coder review may  be revised to meet current compliance requirements. Clarene Essex, MD 11/21/2021 2:26:27 PM This report has been signed electronically. Number of Addenda: 0

## 2021-11-21 NOTE — Anesthesia Postprocedure Evaluation (Signed)
Anesthesia Post Note  Patient: Patrick Collier  Procedure(s) Performed: ENDOSCOPIC RETROGRADE CHOLANGIOPANCREATOGRAPHY (ERCP) SPYGLASS CHOLANGIOSCOPY SPHINCTEROTOMY BIOPSY BILIARY BRUSHING BILIARY STENT PLACEMENT STENT REMOVAL     Patient location during evaluation: PACU Anesthesia Type: General Level of consciousness: awake and alert Pain management: pain level controlled Vital Signs Assessment: post-procedure vital signs reviewed and stable Respiratory status: spontaneous breathing, nonlabored ventilation, respiratory function stable and patient connected to nasal cannula oxygen Cardiovascular status: blood pressure returned to baseline and stable Postop Assessment: no apparent nausea or vomiting Anesthetic complications: no   No notable events documented.  Last Vitals:  Vitals:   11/21/21 1232 11/21/21 1427  BP: 132/68 (!) 157/69  Pulse: (!) 52 (!) 52  Resp: 19 12  Temp: 36.5 C 36.6 C  SpO2: 99% 100%    Last Pain:  Vitals:   11/21/21 1427  TempSrc: Temporal  PainSc: [name]

## 2021-11-22 LAB — CYTOLOGY - NON PAP

## 2021-11-22 LAB — SURGICAL PATHOLOGY

## 2021-11-24 ENCOUNTER — Other Ambulatory Visit (HOSPITAL_COMMUNITY): Payer: Self-pay

## 2021-11-27 ENCOUNTER — Encounter: Payer: Self-pay | Admitting: Hematology

## 2021-11-27 ENCOUNTER — Ambulatory Visit
Admission: RE | Admit: 2021-11-27 | Discharge: 2021-11-27 | Disposition: A | Discharge status: Home / Self Care | Payer: Medicare Other | Source: Ambulatory Visit | Attending: Radiation Oncology | Admitting: Radiation Oncology

## 2021-11-27 ENCOUNTER — Telehealth: Payer: Self-pay | Admitting: *Deleted

## 2021-11-27 ENCOUNTER — Other Ambulatory Visit (HOSPITAL_COMMUNITY): Payer: Self-pay

## 2021-11-27 ENCOUNTER — Other Ambulatory Visit: Payer: Self-pay | Admitting: *Deleted

## 2021-11-27 ENCOUNTER — Inpatient Hospital Stay (HOSPITAL_BASED_OUTPATIENT_CLINIC_OR_DEPARTMENT_OTHER): Payer: Medicare Other | Admitting: Hematology

## 2021-11-27 ENCOUNTER — Telehealth: Payer: Self-pay | Admitting: Pharmacy Technician

## 2021-11-27 ENCOUNTER — Telehealth: Payer: Self-pay | Admitting: Pharmacist

## 2021-11-27 ENCOUNTER — Ambulatory Visit
Admission: RE | Admit: 2021-11-27 | Discharge: 2021-11-27 | Disposition: A | Discharge status: Home / Self Care | Payer: Medicare Other | Source: Ambulatory Visit | Attending: Hematology | Admitting: Hematology

## 2021-11-27 ENCOUNTER — Other Ambulatory Visit: Payer: Self-pay

## 2021-11-27 VITALS — BP 143/78 | HR 55 | Temp 97.4°F | Resp 18 | Ht 66.0 in | Wt 170.9 lb

## 2021-11-27 VITALS — BP 106/70 | HR 58 | Temp 97.9°F | Resp 20 | Ht 66.0 in | Wt 169.2 lb

## 2021-11-27 DIAGNOSIS — C221 Intrahepatic bile duct carcinoma: Secondary | ICD-10-CM | POA: Insufficient documentation

## 2021-11-27 DIAGNOSIS — Z510 Encounter for antineoplastic radiation therapy: Secondary | ICD-10-CM | POA: Insufficient documentation

## 2021-11-27 DIAGNOSIS — R634 Abnormal weight loss: Secondary | ICD-10-CM | POA: Insufficient documentation

## 2021-11-27 LAB — CMP (CANCER CENTER ONLY)
ALT: 19 U/L (ref 0–44)
AST: 21 U/L (ref 15–41)
Albumin: 2.9 g/dL — ABNORMAL LOW (ref 3.5–5.0)
Alkaline Phosphatase: 93 U/L (ref 38–126)
Anion gap: 10 (ref 5–15)
BUN: 11 mg/dL (ref 8–23)
CO2: 24 mmol/L (ref 22–32)
Calcium: 8.5 mg/dL — ABNORMAL LOW (ref 8.9–10.3)
Chloride: 102 mmol/L (ref 98–111)
Creatinine: 0.91 mg/dL (ref 0.61–1.24)
GFR, Estimated: >60 mL/min (ref >60)
Glucose, Bld: 106 mg/dL — ABNORMAL HIGH (ref 70–99)
Potassium: 3.8 mmol/L (ref 3.5–5.1)
Sodium: 136 mmol/L (ref 135–145)
Total Bilirubin: 1.3 mg/dL — ABNORMAL HIGH (ref 0.3–1.2)
Total Protein: 7.1 g/dL (ref 6.5–8.1)

## 2021-11-27 LAB — AMMONIA: Ammonia: 34 umol/L (ref 9–35)

## 2021-11-27 MED ORDER — SODIUM CHLORIDE 0.9% FLUSH
10.0000 mL | Freq: Once | INTRAVENOUS | Status: AC
  Administered 2021-11-27: 10 mL via INTRAVENOUS

## 2021-11-27 MED ORDER — CAPECITABINE 500 MG PO TABS
825.0000 mg/m2 | ORAL_TABLET | Freq: Two times a day (BID) | ORAL | 1 refills | Status: DC
  Filled 2021-11-27: qty 90, 15d supply, fill #0

## 2021-11-27 NOTE — Telephone Encounter (Signed)
Returned patient's wife's phone call, spoke with patient's wife-Patrick Collier

## 2021-11-27 NOTE — Telephone Encounter (Signed)
Oral Oncology Patient Advocate Encounter  After completing a benefits investigation, prior authorization for Capecitabine is not required at this time through Medicare B.  Patient's copay is $416.57.       Lady Deutscher, CPhT-Adv Pharmacy Patient Advocate Specialist [place] Patient Advocate Team Direct Number: [phone]  Fax: [phone]

## 2021-11-27 NOTE — Telephone Encounter (Signed)
Oral Chemotherapy Pharmacist Encounter  I met with patient and patient's wife in clinic for overview of: Xeloda for the treatment of cholangiocarcinoma in conjunction with radiation, planned duration 5 1/2 weeks.   Counseled patient on administration, dosing, side effects, monitoring, drug-food interactions, safe handling, storage, and disposal.  CMP from 11/27/21 and CBC w/ Diff from 10/27/21 assessed, no baseline dose adjustments required.Prescription dose and frequency assessed for appropriateness. Appropriate for therapy initiation.   Patient will take Xeloda 515m tablets, 3 tablets (15054m by mouth in AM and 3 tabs (150033mby mouth in PM, within 30 minutes of finishing meals, on days of radiation only.  Xeloda and radiation start date: 12/04/21  Adverse effects of Xeloda include but are not limited to: fatigue, decreased blood counts, GI upset, diarrhea, mouth sores, and hand-foot syndrome. Patient will obtain anti diarrheal and alert the office of 4 or more loose stools above baseline.   Reviewed with patient importance of keeping a medication schedule and plan for any missed doses. No barriers to medication adherence identified.  Medication reconciliation performed and medication/allergy list updated. Current medication list in Epic reviewed, no relevant/significant DDIs with Xeloda identified.  All questions answered.  Patient agreement for treatment documented in MD note on 11/27/21.  Mr. FarPuryeariced understanding and appreciation.   Medication education handout and medication calendar given to patient. Patient knows to call the office with questions or concerns. Oral Chemotherapy Clinic phone number provided to patient.   RebLeron CroakharmD, BCPS, BCOKona Community Hospitalmatology/Oncology Clinical Pharmacist [place] Sidled HigPolk [phone]/[phone] 3:52 PM

## 2021-11-27 NOTE — Progress Notes (Signed)
[place]   [phone] Fax:([phone]) [phone]   Clinic Follow up Note   Patient Care Team: London Pepper, MD as PCP - General (Family Medicine) Stanford Breed Denice Bors, MD as PCP - Cardiology (Cardiology) Lelon Perla, MD as Consulting Physician (Cardiology)  Date of Service:  11/27/2021  CHIEF COMPLAINT: f/u of suspected cholangiocarcinoma  CURRENT THERAPY:  To start concurrent chemoRT with Xeloda, on 12/04/21  -Xeloda dose: 1531m BID, M-F  ASSESSMENT & PLAN:  Patrick GANUSis a 81y.o. male with   1. 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 CT AP 10/06/21 showed biliary obstruction, he was referred to ED. -s/p ERCP 10/11/21 inpatient showed a mass in common bile duct, cytology not diagnostic showing only suspicious atypical cells. S/p stent placement  -staging chest CT 11/03/21 was negative. -he met Dr. AZenia Resideson 11/08/21 and was offered surgery, but pt declined. -repeat ERCP on 11/21/21 for stent change, path and cytology were again non-diagnostic but suspicious. -he met Dr. MLisbeth Renshawon 11/16/21 and underwent CT simulation today, 11/27/21. -I reviewed the cytology results with them today. I discussed that although his repeated FNA cytology was still non-diagnostic, based on the imaging and ERCP findings, the consensus from tumor board is this is most consistent with extrahepatic cholangiocarcinoma. I discussed treatment non-surgical options with them today. I recommend concurrent Xeloda with his radiation treatment. I explained that Xeloda will help make the radiation more effective.  Based on the published retrospective data, patients with now metastatic extrahepatic cholangiocarcinoma who underwent concurrent chemoradiation have cellaburate about 30% in 1 year, and 7% in 5 years.   --Chemotherapy consent: Side effects including but does not not limited to, fatigue, nausea, vomiting, diarrhea, hair  loss, neuropathy, fluid retention, liver and kidney dysfunction, neutropenic fever, needed for blood transfusion, bleeding, were discussed with patient in great detail. He agrees to proceed. -he is scheduled to start radiation on 12/04/21. I discussed he will take the Xeloda twice a day on radiation treatment days only (M-F). Pharmacist RWells Guilesmet with them today to further discuss what to expect on the medication. -labs reviewed, his LFT's are now WNL, and his T.bili is down to 1.3. -He had chemo education with our pharmacist RWells Guilestoday -[place] he will start next Monday with radiation.   PLAN: -lab and start radiation and Xeloda on 12/04/21  -Xeloda dose: 15066m(3 tablets) q12hr with RT on Monday to Friday, I called in today  -weekly lab during chemoRT -f/u on week 2, 4, and 6   No problem-specific Assessment & Plan notes found for this encounter.   SUMMARY OF ONCOLOGIC HISTORY: Oncology History  [place]   MR ABDOMEN MRCP W WO CONTAST  IMPRESSION: 1. Severe biliary ductal dilation of both the intra and extrahepatic bile ducts with abrupt cutoff seen at the distal common bile duct. Findings are concerning for stricture or obstructing tumor. No obstructing mass is seen, although motion artifact on contrast-enhanced imaging limits evaluation. Recommend correlation with ERCP. 2. Small cystic lesions of the pancreatic tail, largest measures 10 mm. Recommend follow up pre and post contrast MRI/MRCP or pancreatic protocol CT in 2 years. This recommendation follows ACR consensus guidelines: Management of Incidental Pancreatic Cysts: A White Paper of the ACR Incidental Findings Committee. J Am Coll Radiol 202706;23:[id number]. Trace perihepatic ascites and trace bilateral pleural effusions.   10/11/2021 Procedure   ERCP-Dr. StFuller PlanImpression:  -  I single localized malignant appearing severe biliary stricture was  found in the lower third of the main bile duct. -The proximal biliary tree was dilated, secondary to stricture -Biliary sphincterotomy was performed -Cells for cytology obtained in the lower third of the main duct -One plastic stent was placed into the common bile duct.     10/11/2021 Pathology Results   CYTOLOGY - NON PAP  CASE: [id number]  PATIENT: Patrick Collier  Non-Gynecological Cytology Report   FINAL MICROSCOPIC DIAGNOSIS:  A. COMMON BILE DUCT, LESION, FINE NEEDLE ASPIRATION:  - No malignant cells seen  -Scant cellularity; benign/reactive ductal cells and ductal epithelium  with acute inflammatory cells   B. COMMON BILE DUCT, STRICTURE, BRUSHING:  - Atypical cells suspicious for tumor present    10/26/2021 Initial Diagnosis   Cholangiocarcinoma ([place])    Procedure   Upper EUS-Dr. Paulita Fujita  Impression:  - There was no sign of significant pathology in the ampulla. - A mass was found in the common bile duct. Fine needle aspiration performed. - There was no sign of significant pathology in the pancreatic head, genu of the pancreas and pancreatic body. - A few abnormal lymph nodes were visualized in the peripancreatic region and porta hepatis region. - There was dilation in the middle third of the main bile duct, in the upper third of the main bile duct and in the common hepatic duct which measured up to 15 mm. - Small perihepatic ascites. - Overall constellation findings most consistent mid common bile duct cholangiocarcinoma.      INTERVAL HISTORY:  Patrick Collier is here for a follow up of suspected cholangiocarcinoma. He was last seen by me with PA Cassie on 10/27/21 in consultation. He presents to the clinic accompanied by his wife. He reports he is feeling well, no new concerns.   All other systems were reviewed with the patient and are negative.  MEDICAL HISTORY:  Past Medical History:  Diagnosis Date   Arthritis    Back pain    Heart murmur    History of  kidney stones    ICH (intracerebral hemorrhage) ([place])     SURGICAL HISTORY: Past Surgical History:  Procedure Laterality Date   BACK SURGERY     BILIARY BRUSHING  10/11/2021   Procedure: BILIARY BRUSHING;  Surgeon: Ladene Artist, MD;  Location: [facility];  Service: Gastroenterology;;   BILIARY BRUSHING  11/21/2021   Procedure: BILIARY BRUSHING;  Surgeon: Clarene Essex, MD;  Location: [facility];  Service: Gastroenterology;;   BILIARY STENT PLACEMENT N/A 10/11/2021   Procedure: BILIARY STENT PLACEMENT;  Surgeon: Ladene Artist, MD;  Location: [facility];  Service: Gastroenterology;  Laterality: N/A;   BILIARY STENT PLACEMENT N/A 11/21/2021   Procedure: BILIARY STENT PLACEMENT;  Surgeon: Clarene Essex, MD;  Location: [facility];  Service: Gastroenterology;  Laterality: N/A;   BIOPSY  11/21/2021   Procedure: BIOPSY;  Surgeon: Clarene Essex, MD;  Location: [facility];  Service: Gastroenterology;;   ERCP N/A 10/11/2021   Procedure: ENDOSCOPIC RETROGRADE CHOLANGIOPANCREATOGRAPHY (ERCP);  Surgeon: Ladene Artist, MD;  Location: [facility];  Service: Gastroenterology;  Laterality: N/A;   ERCP N/A 11/21/2021   Procedure: ENDOSCOPIC RETROGRADE CHOLANGIOPANCREATOGRAPHY (ERCP);  Surgeon: Clarene Essex, MD;  Location: [facility];  Service: Gastroenterology;  Laterality: N/A;  with Spyglass   ESOPHAGOGASTRODUODENOSCOPY (EGD) WITH PROPOFOL N/A 10/11/2021   Procedure: ESOPHAGOGASTRODUODENOSCOPY (EGD) WITH PROPOFOL;  Surgeon: Arta Silence, MD;  Location: [facility];  Service: Gastroenterology;  Laterality: N/A;   EUS N/A 10/11/2021  Procedure: UPPER ENDOSCOPIC ULTRASOUND (EUS) LINEAR;  Surgeon: Arta Silence, MD;  Location: [facility];  Service: Gastroenterology;  Laterality: N/A;   FINE NEEDLE ASPIRATION N/A 10/11/2021   Procedure: FINE NEEDLE ASPIRATION (FNA) LINEAR;  Surgeon: Arta Silence, MD;  Location: [facility];  Service: Gastroenterology;  Laterality: N/A;   INGUINAL HERNIA REPAIR      KNEE ARTHROPLASTY Right 05/16/2017   Procedure: RIGHT TOTAL KNEE ARTHROPLASTY WITH COMPUTER NAVIGATION;  Surgeon: Rod Can, MD;  Location: [facility];  Service: Orthopedics;  Laterality: Right;  Needs RNFA   NECK SURGERY     SPHINCTEROTOMY  10/11/2021   Procedure: SPHINCTEROTOMY;  Surgeon: Ladene Artist, MD;  Location: [facility];  Service: Gastroenterology;;   [name]  11/21/2021   Procedure: [name];  Surgeon: Clarene Essex, MD;  Location: [facility];  Service: Gastroenterology;;   [name] CHOLANGIOSCOPY N/A 11/21/2021   Procedure: MMNOTRRN CHOLANGIOSCOPY;  Surgeon: Clarene Essex, MD;  Location: [facility];  Service: Gastroenterology;  Laterality: N/A;   STENT REMOVAL  11/21/2021   Procedure: STENT REMOVAL;  Surgeon: Clarene Essex, MD;  Location: [facility];  Service: Gastroenterology;;   TONSILLECTOMY      I have reviewed the social history and family history with the patient and they are unchanged from previous note.  ALLERGIES:  has No Known Allergies.  MEDICATIONS:  Current Outpatient Medications  Medication Sig Dispense Refill   capecitabine (XELODA) 500 MG tablet Take 3 tablets (1,500 mg total) by mouth 2 (two) times daily after a meal. Take every 12 hours after meal, take on days of radiation, Monday through Friday 90 tablet 1   lactulose (CHRONULAC) 10 GM/15ML solution Take 30 mLs (20 g total) by mouth 2 (two) times daily as needed for mild constipation or moderate constipation. (Patient taking differently: Take 20 g by mouth 2 (two) times daily.) 473 mL 0   No current facility-administered medications for this visit.    PHYSICAL EXAMINATION: ECOG PERFORMANCE STATUS: 0 - Asymptomatic  Vitals:   11/27/21 1535  BP: (!) 143/78  Pulse: (!) 55  Resp: 18  Temp: (!) 97.4 F (36.3 C)  SpO2: 100%   Wt Readings from Last 3 Encounters:  11/27/21 170 lb 14.4 oz (77.5 kg)  11/27/21 169 lb 3.2 oz (76.7 kg)  11/21/21 166 lb (75.3 kg)     GENERAL:alert, no  distress and comfortable SKIN: skin color, texture, turgor are normal, no rashes or significant lesions EYES: normal, Conjunctiva are pink and non-injected, sclera clear NECK: supple, thyroid normal size, non-tender, without nodularity LYMPH:  no palpable lymphadenopathy in the cervical, axillary  LUNGS: clear to auscultation and percussion with normal breathing effort HEART: regular rate & rhythm and no murmurs and no lower extremity edema ABDOMEN:abdomen soft, non-tender and normal bowel sounds Musculoskeletal:no cyanosis of digits and no clubbing  NEURO: alert & oriented x 3 with fluent speech, no focal motor/sensory deficits  LABORATORY DATA:  I have reviewed the data as listed    Latest Ref Rng & Units 10/27/2021   12:32 PM 10/12/2021    4:43 AM 10/11/2021    5:05 AM  CBC  WBC 4.0 - 10.5 K/uL 6.1  9.0  7.6   Hemoglobin 13.0 - 17.0 g/dL 12.1  12.6  11.8   Hematocrit 39.0 - 52.0 % 36.1  36.0  33.5   Platelets 150 - 400 K/uL 367  249  216         Latest Ref Rng & Units 11/27/2021    1:13 PM 10/27/2021  12:32 PM 10/12/2021    4:43 AM  CMP  Glucose 70 - 99 mg/dL 106  75  161   BUN 8 - 23 mg/dL 11  13  14    Creatinine 0.61 - 1.24 mg/dL 0.91  0.87  0.91   Sodium 135 - 145 mmol/L 136  136  137   Potassium 3.5 - 5.1 mmol/L 3.8  4.4  4.5   Chloride 98 - 111 mmol/L 102  101  105   CO2 22 - 32 mmol/L 24  31  26    Calcium 8.9 - 10.3 mg/dL 8.5  8.9  8.3   Total Protein 6.5 - 8.1 g/dL 7.1  7.3  6.4   Total Bilirubin 0.3 - 1.2 mg/dL 1.3  3.1  8.2   Alkaline Phos 38 - 126 U/L 93  327  460   AST 15 - 41 U/L 21  71  141   ALT 0 - 44 U/L 19  73  122       RADIOGRAPHIC STUDIES: I have personally reviewed the radiological images as listed and agreed with the findings in the report. No results found.    Orders Placed This Encounter  Procedures   Cancer antigen 19-9    Standing Status:   Standing    Number of Occurrences:   20    Standing Expiration Date:   11/28/2022   CBC with  Differential/Platelet    Standing Status:   Standing    Number of Occurrences:   50    Standing Expiration Date:   11/28/2022   Comprehensive metabolic panel    Standing Status:   Standing    Number of Occurrences:   50    Standing Expiration Date:   11/28/2022   All questions were answered. The patient knows to call the clinic with any problems, questions or concerns. No barriers to learning was detected. The total time spent in the appointment was 40 minutes.     Truitt Merle, MD 11/27/2021   I, Wilburn Mylar, am acting as scribe for Truitt Merle, MD.   I have reviewed the above documentation for accuracy and completeness, and I agree with the above.

## 2021-11-27 NOTE — Progress Notes (Signed)
Has armband been applied?  Yes  Does patient have an allergy to IV contrast dye?: No   Has patient ever received premedication for IV contrast dye?: n/a   Does patient take metformin?: no  If patient does take metformin when was the last dose: n/a  Date of lab work: 10/27/2021 BUN: 13 CR: 0.87 eGfr:  >60  IV site: LAC  Has IV site been added to flowsheet?  Yes

## 2021-11-27 NOTE — Telephone Encounter (Signed)
Called patient to inform of lab for 11-27-21 @  1 pm, spoke with patient's wife- [name] and she is aware of this appt. and is good with this appt.

## 2021-11-28 ENCOUNTER — Other Ambulatory Visit (HOSPITAL_COMMUNITY): Payer: Self-pay

## 2021-11-28 MED ORDER — CAPECITABINE 500 MG PO TABS
825.0000 mg/m2 | ORAL_TABLET | Freq: Two times a day (BID) | ORAL | 1 refills | Status: DC
  Filled 2021-11-28 (×2): qty 90, 15d supply, fill #0
  Filled 2021-12-06: qty 78, 13d supply, fill #1

## 2021-12-01 DIAGNOSIS — C221 Intrahepatic bile duct carcinoma: Secondary | ICD-10-CM | POA: Diagnosis not present

## 2021-12-04 ENCOUNTER — Other Ambulatory Visit: Payer: Self-pay

## 2021-12-04 ENCOUNTER — Ambulatory Visit
Admission: RE | Admit: 2021-12-04 | Discharge: 2021-12-04 | Disposition: A | Discharge status: Home / Self Care | Payer: Medicare Other | Source: Ambulatory Visit | Attending: Radiation Oncology | Admitting: Radiation Oncology

## 2021-12-04 ENCOUNTER — Other Ambulatory Visit (HOSPITAL_COMMUNITY): Payer: Self-pay

## 2021-12-04 ENCOUNTER — Inpatient Hospital Stay: Payer: Medicare Other

## 2021-12-04 DIAGNOSIS — Z510 Encounter for antineoplastic radiation therapy: Secondary | ICD-10-CM | POA: Diagnosis not present

## 2021-12-04 DIAGNOSIS — C221 Intrahepatic bile duct carcinoma: Secondary | ICD-10-CM

## 2021-12-04 LAB — CBC WITH DIFFERENTIAL/PLATELET
Abs Immature Granulocytes: 0.05 10*3/uL (ref 0.00–0.07)
Basophils Absolute: 0 10*3/uL (ref 0.0–0.1)
Basophils Relative: 0 %
Eosinophils Absolute: 0.1 10*3/uL (ref 0.0–0.5)
Eosinophils Relative: 1 %
HCT: 37.2 % — ABNORMAL LOW (ref 39.0–52.0)
Hemoglobin: 12.6 g/dL — ABNORMAL LOW (ref 13.0–17.0)
Immature Granulocytes: 1 %
Lymphocytes Relative: 15 %
Lymphs Abs: 1.4 10*3/uL (ref 0.7–4.0)
MCH: 32.6 pg (ref 26.0–34.0)
MCHC: 33.9 g/dL (ref 30.0–36.0)
MCV: 96.4 fL (ref 80.0–100.0)
Monocytes Absolute: 1.3 10*3/uL — ABNORMAL HIGH (ref 0.1–1.0)
Monocytes Relative: 15 %
Neutro Abs: 6.1 10*3/uL (ref 1.7–7.7)
Neutrophils Relative %: 68 %
Platelets: 334 10*3/uL (ref 150–400)
RBC: 3.86 MIL/uL — ABNORMAL LOW (ref 4.22–5.81)
RDW: 12.2 % (ref 11.5–15.5)
WBC: 8.9 10*3/uL (ref 4.0–10.5)
nRBC: 0 % (ref 0.0–0.2)

## 2021-12-04 LAB — RAD ONC ARIA SESSION SUMMARY
Course Elapsed Days: 0
Plan Fractions Treated to Date: 1
Plan Prescribed Dose Per Fraction: 1.8 Gy
Plan Total Fractions Prescribed: 25
Plan Total Prescribed Dose: 45 Gy
Reference Point Dosage Given to Date: 1.8 Gy
Reference Point Session Dosage Given: 1.8 Gy
Session Number: 1

## 2021-12-04 LAB — COMPREHENSIVE METABOLIC PANEL
ALT: 16 U/L (ref 0–44)
AST: 19 U/L (ref 15–41)
Albumin: 3.5 g/dL (ref 3.5–5.0)
Alkaline Phosphatase: 120 U/L (ref 38–126)
Anion gap: 3 — ABNORMAL LOW (ref 5–15)
BUN: 11 mg/dL (ref 8–23)
CO2: 31 mmol/L (ref 22–32)
Calcium: 8.7 mg/dL — ABNORMAL LOW (ref 8.9–10.3)
Chloride: 98 mmol/L (ref 98–111)
Creatinine, Ser: 0.93 mg/dL (ref 0.61–1.24)
GFR, Estimated: >60 mL/min (ref >60)
Glucose, Bld: 101 mg/dL — ABNORMAL HIGH (ref 70–99)
Potassium: 4.1 mmol/L (ref 3.5–5.1)
Sodium: 132 mmol/L — ABNORMAL LOW (ref 135–145)
Total Bilirubin: 1.1 mg/dL (ref 0.3–1.2)
Total Protein: 7.5 g/dL (ref 6.5–8.1)

## 2021-12-05 ENCOUNTER — Ambulatory Visit
Admission: RE | Admit: 2021-12-05 | Discharge: 2021-12-05 | Disposition: A | Discharge status: Home / Self Care | Payer: Medicare Other | Source: Ambulatory Visit | Attending: Radiation Oncology | Admitting: Radiation Oncology

## 2021-12-05 ENCOUNTER — Ambulatory Visit: Payer: Medicare Other

## 2021-12-05 ENCOUNTER — Other Ambulatory Visit: Payer: Self-pay

## 2021-12-05 DIAGNOSIS — C221 Intrahepatic bile duct carcinoma: Secondary | ICD-10-CM | POA: Diagnosis not present

## 2021-12-05 DIAGNOSIS — Z510 Encounter for antineoplastic radiation therapy: Secondary | ICD-10-CM | POA: Diagnosis not present

## 2021-12-05 LAB — RAD ONC ARIA SESSION SUMMARY
Course Elapsed Days: 1
Plan Fractions Treated to Date: 2
Plan Prescribed Dose Per Fraction: 1.8 Gy
Plan Total Fractions Prescribed: 25
Plan Total Prescribed Dose: 45 Gy
Reference Point Dosage Given to Date: 3.6 Gy
Reference Point Session Dosage Given: 1.8 Gy
Session Number: 2

## 2021-12-05 LAB — CANCER ANTIGEN 19-9: CA 19-9: 38 U/mL — ABNORMAL HIGH (ref 0–35)

## 2021-12-06 ENCOUNTER — Other Ambulatory Visit: Payer: Self-pay

## 2021-12-06 ENCOUNTER — Ambulatory Visit
Admission: RE | Admit: 2021-12-06 | Discharge: 2021-12-06 | Disposition: A | Discharge status: Home / Self Care | Payer: Medicare Other | Source: Ambulatory Visit | Attending: Radiation Oncology | Admitting: Radiation Oncology

## 2021-12-06 ENCOUNTER — Other Ambulatory Visit (HOSPITAL_COMMUNITY): Payer: Self-pay

## 2021-12-06 ENCOUNTER — Ambulatory Visit: Payer: Medicare Other

## 2021-12-06 DIAGNOSIS — Z510 Encounter for antineoplastic radiation therapy: Secondary | ICD-10-CM | POA: Diagnosis not present

## 2021-12-06 DIAGNOSIS — C221 Intrahepatic bile duct carcinoma: Secondary | ICD-10-CM | POA: Diagnosis not present

## 2021-12-06 LAB — RAD ONC ARIA SESSION SUMMARY
Course Elapsed Days: 2
Plan Fractions Treated to Date: 3
Plan Prescribed Dose Per Fraction: 1.8 Gy
Plan Total Fractions Prescribed: 25
Plan Total Prescribed Dose: 45 Gy
Reference Point Dosage Given to Date: 5.4 Gy
Reference Point Session Dosage Given: 1.8 Gy
Session Number: 3

## 2021-12-06 NOTE — Progress Notes (Signed)
Pt here for patient teaching.  Pt given Radiation and You booklet, skin care instructions, and Sonafine.  Reviewed areas of pertinence such as diarrhea, fatigue, hair loss, nausea and vomiting, skin changes, and urinary and bladder changes . Pt able to give teach back of to pat skin, use unscented/gentle soap, use baby wipes, have Imodium on hand, drink plenty of water, and sitz bath,apply Sonafine bid and avoid applying anything to skin within 4 hours of treatment. Pt verbalizes understanding of information given and will contact nursing with any questions or concerns.    Amos Micheals M. Catarina Huntley RN, BSN  

## 2021-12-07 ENCOUNTER — Ambulatory Visit
Admission: RE | Admit: 2021-12-07 | Discharge: 2021-12-07 | Disposition: A | Discharge status: Home / Self Care | Payer: Medicare Other | Source: Ambulatory Visit | Attending: Radiation Oncology | Admitting: Radiation Oncology

## 2021-12-07 ENCOUNTER — Other Ambulatory Visit: Payer: Self-pay

## 2021-12-07 DIAGNOSIS — C221 Intrahepatic bile duct carcinoma: Secondary | ICD-10-CM

## 2021-12-07 DIAGNOSIS — Z510 Encounter for antineoplastic radiation therapy: Secondary | ICD-10-CM | POA: Diagnosis not present

## 2021-12-07 LAB — RAD ONC ARIA SESSION SUMMARY
Course Elapsed Days: 3
Plan Fractions Treated to Date: 4
Plan Prescribed Dose Per Fraction: 1.8 Gy
Plan Total Fractions Prescribed: 25
Plan Total Prescribed Dose: 45 Gy
Reference Point Dosage Given to Date: 7.2 Gy
Reference Point Session Dosage Given: 1.8 Gy
Session Number: 4

## 2021-12-07 MED ORDER — SONAFINE EX EMUL
1.0000 | Freq: Once | CUTANEOUS | Status: AC
  Administered 2021-12-07: 1 via TOPICAL

## 2021-12-08 ENCOUNTER — Ambulatory Visit
Admission: RE | Admit: 2021-12-08 | Discharge: 2021-12-08 | Disposition: A | Discharge status: Home / Self Care | Payer: Medicare Other | Source: Ambulatory Visit | Attending: Radiation Oncology | Admitting: Radiation Oncology

## 2021-12-08 ENCOUNTER — Other Ambulatory Visit: Payer: Self-pay

## 2021-12-08 DIAGNOSIS — C221 Intrahepatic bile duct carcinoma: Secondary | ICD-10-CM | POA: Diagnosis not present

## 2021-12-08 DIAGNOSIS — Z510 Encounter for antineoplastic radiation therapy: Secondary | ICD-10-CM | POA: Diagnosis not present

## 2021-12-08 LAB — RAD ONC ARIA SESSION SUMMARY
Course Elapsed Days: 4
Plan Fractions Treated to Date: 5
Plan Prescribed Dose Per Fraction: 1.8 Gy
Plan Total Fractions Prescribed: 25
Plan Total Prescribed Dose: 45 Gy
Reference Point Dosage Given to Date: 9 Gy
Reference Point Session Dosage Given: 1.8 Gy
Session Number: 5

## 2021-12-11 ENCOUNTER — Other Ambulatory Visit: Payer: Self-pay

## 2021-12-11 ENCOUNTER — Ambulatory Visit
Admission: RE | Admit: 2021-12-11 | Discharge: 2021-12-11 | Disposition: A | Discharge status: Home / Self Care | Payer: Medicare Other | Source: Ambulatory Visit | Attending: Radiation Oncology | Admitting: Radiation Oncology

## 2021-12-11 DIAGNOSIS — Z510 Encounter for antineoplastic radiation therapy: Secondary | ICD-10-CM | POA: Diagnosis not present

## 2021-12-11 DIAGNOSIS — C221 Intrahepatic bile duct carcinoma: Secondary | ICD-10-CM | POA: Diagnosis not present

## 2021-12-11 LAB — RAD ONC ARIA SESSION SUMMARY
Course Elapsed Days: 7
Plan Fractions Treated to Date: 6
Plan Prescribed Dose Per Fraction: 1.8 Gy
Plan Total Fractions Prescribed: 25
Plan Total Prescribed Dose: 45 Gy
Reference Point Dosage Given to Date: 10.8 Gy
Reference Point Session Dosage Given: 1.8 Gy
Session Number: 6

## 2021-12-12 ENCOUNTER — Inpatient Hospital Stay (HOSPITAL_BASED_OUTPATIENT_CLINIC_OR_DEPARTMENT_OTHER): Payer: Medicare Other | Admitting: Hematology

## 2021-12-12 ENCOUNTER — Ambulatory Visit
Admission: RE | Admit: 2021-12-12 | Discharge: 2021-12-12 | Disposition: A | Discharge status: Home / Self Care | Payer: Medicare Other | Source: Ambulatory Visit | Attending: Radiation Oncology | Admitting: Radiation Oncology

## 2021-12-12 ENCOUNTER — Inpatient Hospital Stay: Payer: Medicare Other

## 2021-12-12 ENCOUNTER — Encounter: Payer: Self-pay | Admitting: Hematology

## 2021-12-12 ENCOUNTER — Other Ambulatory Visit: Payer: Self-pay

## 2021-12-12 VITALS — BP 105/70 | HR 56 | Temp 98.2°F | Resp 18 | Ht 66.0 in | Wt 162.1 lb

## 2021-12-12 DIAGNOSIS — C221 Intrahepatic bile duct carcinoma: Secondary | ICD-10-CM | POA: Diagnosis not present

## 2021-12-12 DIAGNOSIS — Z510 Encounter for antineoplastic radiation therapy: Secondary | ICD-10-CM | POA: Diagnosis not present

## 2021-12-12 LAB — CBC WITH DIFFERENTIAL/PLATELET
Abs Immature Granulocytes: 0.04 10*3/uL (ref 0.00–0.07)
Basophils Absolute: 0 10*3/uL (ref 0.0–0.1)
Basophils Relative: 0 %
Eosinophils Absolute: 0.1 10*3/uL (ref 0.0–0.5)
Eosinophils Relative: 1 %
HCT: 36.1 % — ABNORMAL LOW (ref 39.0–52.0)
Hemoglobin: 12.3 g/dL — ABNORMAL LOW (ref 13.0–17.0)
Immature Granulocytes: 1 %
Lymphocytes Relative: 10 %
Lymphs Abs: 0.6 10*3/uL — ABNORMAL LOW (ref 0.7–4.0)
MCH: 32.5 pg (ref 26.0–34.0)
MCHC: 34.1 g/dL (ref 30.0–36.0)
MCV: 95.5 fL (ref 80.0–100.0)
Monocytes Absolute: 1.1 10*3/uL — ABNORMAL HIGH (ref 0.1–1.0)
Monocytes Relative: 16 %
Neutro Abs: 4.8 10*3/uL (ref 1.7–7.7)
Neutrophils Relative %: 72 %
Platelets: 381 10*3/uL (ref 150–400)
RBC: 3.78 MIL/uL — ABNORMAL LOW (ref 4.22–5.81)
RDW: 12.4 % (ref 11.5–15.5)
WBC: 6.7 10*3/uL (ref 4.0–10.5)
nRBC: 0 % (ref 0.0–0.2)

## 2021-12-12 LAB — COMPREHENSIVE METABOLIC PANEL
ALT: 11 U/L (ref 0–44)
AST: 18 U/L (ref 15–41)
Albumin: 3.3 g/dL — ABNORMAL LOW (ref 3.5–5.0)
Alkaline Phosphatase: 86 U/L (ref 38–126)
Anion gap: 3 — ABNORMAL LOW (ref 5–15)
BUN: 11 mg/dL (ref 8–23)
CO2: 32 mmol/L (ref 22–32)
Calcium: 8.8 mg/dL — ABNORMAL LOW (ref 8.9–10.3)
Chloride: 100 mmol/L (ref 98–111)
Creatinine, Ser: 0.84 mg/dL (ref 0.61–1.24)
GFR, Estimated: >60 mL/min (ref >60)
Glucose, Bld: 114 mg/dL — ABNORMAL HIGH (ref 70–99)
Potassium: 4.2 mmol/L (ref 3.5–5.1)
Sodium: 135 mmol/L (ref 135–145)
Total Bilirubin: 1 mg/dL (ref 0.3–1.2)
Total Protein: 6.9 g/dL (ref 6.5–8.1)

## 2021-12-12 LAB — RAD ONC ARIA SESSION SUMMARY
Course Elapsed Days: 8
Plan Fractions Treated to Date: 7
Plan Prescribed Dose Per Fraction: 1.8 Gy
Plan Total Fractions Prescribed: 25
Plan Total Prescribed Dose: 45 Gy
Reference Point Dosage Given to Date: 12.6 Gy
Reference Point Session Dosage Given: 1.8 Gy
Session Number: 7

## 2021-12-12 MED ORDER — ONDANSETRON HCL 8 MG PO TABS: 8.0000 mg | ORAL_TABLET | Freq: Three times a day (TID) | ORAL | 0 refills | Status: DC | PRN

## 2021-12-12 NOTE — Progress Notes (Signed)
Patrick Collier   [phone] [phone]   Clinic Follow up Note   Patient Care Team: London Pepper, MD as PCP - General (Family Medicine) Stanford Breed Denice Bors, MD as PCP - Cardiology (Cardiology) Lelon Perla, MD as Consulting Physician (Cardiology)  Date of Service:  12/12/2021  CHIEF COMPLAINT: f/u of suspected cholangiocarcinoma  CURRENT THERAPY:  Concurrent chemoRT with Xeloda, on 12/04/21             -Xeloda dose: $RemoveBefor'1500mg'[id number]$  BID, M-F   ASSESSMENT & PLAN:  Patrick Collier is a 81 y.o. male with   1. 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 CT AP 10/06/21 showed biliary obstruction, he was referred to ED. -s/p ERCP 10/11/21 inpatient showed a mass in common bile duct, cytology not diagnostic showing only suspicious atypical cells. S/p stent placement  -staging chest CT 11/03/21 was negative. -he met Dr. Zenia Resides on 11/08/21 and was offered surgery, but pt declined. -repeat ERCP on 11/21/21 for stent change, path and cytology were again non-diagnostic but suspicious. -he met Dr. Lisbeth Renshaw on 11/16/21 and underwent CT simulation on 11/27/21. -he began radiation with concurrent Xeloda on 12/04/21. He is tolerating moderately well but has experienced nausea and weight loss. He also has a rash to his hands and face. I recommend holding Xeloda for now, we will check back in next week. -labs reviewed, his T.bili is also WNL now. His mild anemia is stable.  2. Symptom Management: Nausea, Weight loss -his baseline weight was 187 in 06/2021; he is down to 162 today (12/12/21). I offered f/u with nutritionist, they decline for now. -I called in zofran for his nausea.     PLAN: -hold Xeloda due to significant weight loss and fatigue  -I called in zofran -continue daily radiation -will add f/u to 8/28 appointments   No problem-specific Assessment & Plan notes found for this encounter.   SUMMARY OF  ONCOLOGIC HISTORY: Oncology History  Cholangiocarcinoma ([place])  10/07/2021 Imaging   MR ABDOMEN MRCP W WO CONTAST  IMPRESSION: 1. Severe biliary ductal dilation of both the intra and extrahepatic bile ducts with abrupt cutoff seen at the distal common bile duct. Findings are concerning for stricture or obstructing tumor. No obstructing mass is seen, although motion artifact on contrast-enhanced imaging limits evaluation. Recommend correlation with ERCP. 2. Small cystic lesions of the pancreatic tail, largest measures 10 mm. Recommend follow up pre and post contrast MRI/MRCP or pancreatic protocol CT in 2 years. This recommendation follows ACR consensus guidelines: Management of Incidental Pancreatic Cysts: A White Paper of the ACR Incidental Findings Committee. J Am Coll Radiol 0459;97:741-423. 3. Trace perihepatic ascites and trace bilateral pleural effusions.   10/11/2021 Procedure   ERCP-Dr. Fuller Plan  Impression:  -I single localized malignant appearing severe biliary stricture was found in the lower third of the main bile duct. -The proximal biliary tree was dilated, secondary to stricture -Biliary sphincterotomy was performed -Cells for cytology obtained in the lower third of the main duct -One plastic stent was placed into the common bile duct.     10/11/2021 Pathology Results   CYTOLOGY - NON PAP  CASE: [id number]  PATIENT: [name]  Non-Gynecological Cytology Report   FINAL MICROSCOPIC DIAGNOSIS:  A. COMMON BILE DUCT, LESION, FINE NEEDLE ASPIRATION:  - No malignant cells seen  -Scant cellularity; benign/reactive ductal cells and ductal epithelium  with acute inflammatory cells   B. COMMON BILE DUCT, STRICTURE, BRUSHING:  - Atypical cells  suspicious for tumor present    10/26/2021 Initial Diagnosis   Cholangiocarcinoma ([place])    Procedure   Upper EUS-Dr. Paulita Fujita  Impression:  - There was no sign of significant pathology in the ampulla. - A mass was found in  the common bile duct. Fine needle aspiration performed. - There was no sign of significant pathology in the pancreatic head, genu of the pancreas and pancreatic body. - A few abnormal lymph nodes were visualized in the peripancreatic region and porta hepatis region. - There was dilation in the middle third of the main bile duct, in the upper third of the main bile duct and in the common hepatic duct which measured up to 15 mm. - Small perihepatic ascites. - Overall constellation findings most consistent mid common bile duct cholangiocarcinoma.   11/21/2021 Cancer Staging   Staging form: Distal Bile Duct, AJCC 8th Edition - Clinical stage from 11/21/2021: Stage Unknown (cTX, cN0, cM0) - Signed by Truitt Merle, MD on 12/12/2021 Total positive nodes: 0      INTERVAL HISTORY:  Patrick Collier is here for a follow up of cholangiocarcinoma. He was last seen by me on 11/27/21. He presents to the clinic accompanied by his daughter. They reports his appetite is low and he is having fatigue. He denies any bowel issues. His daughter also reports he is having more memory issues recently.   All other systems were reviewed with the patient and are negative.  MEDICAL HISTORY:  Past Medical History:  Diagnosis Date   Arthritis    Back pain    Heart murmur    History of kidney stones    ICH (intracerebral hemorrhage) ([place] Patrick Collier)     SURGICAL HISTORY: Past Surgical History:  Procedure Laterality Date   BACK SURGERY     BILIARY BRUSHING  10/11/2021   Procedure: BILIARY BRUSHING;  Surgeon: Ladene Artist, MD;  Location: [facility];  Service: Gastroenterology;;   BILIARY BRUSHING  11/21/2021   Procedure: BILIARY BRUSHING;  Surgeon: Clarene Essex, MD;  Location: [facility];  Service: Gastroenterology;;   BILIARY STENT PLACEMENT N/A 10/11/2021   Procedure: BILIARY STENT PLACEMENT;  Surgeon: Ladene Artist, MD;  Location: [facility];  Service: Gastroenterology;  Laterality: N/A;   BILIARY STENT PLACEMENT  N/A 11/21/2021   Procedure: BILIARY STENT PLACEMENT;  Surgeon: Clarene Essex, MD;  Location: [facility];  Service: Gastroenterology;  Laterality: N/A;   BIOPSY  11/21/2021   Procedure: BIOPSY;  Surgeon: Clarene Essex, MD;  Location: [facility];  Service: Gastroenterology;;   ERCP N/A 10/11/2021   Procedure: ENDOSCOPIC RETROGRADE CHOLANGIOPANCREATOGRAPHY (ERCP);  Surgeon: Ladene Artist, MD;  Location: [facility];  Service: Gastroenterology;  Laterality: N/A;   ERCP N/A 11/21/2021   Procedure: ENDOSCOPIC RETROGRADE CHOLANGIOPANCREATOGRAPHY (ERCP);  Surgeon: Clarene Essex, MD;  Location: [facility];  Service: Gastroenterology;  Laterality: N/A;  with Spyglass   ESOPHAGOGASTRODUODENOSCOPY (EGD) WITH PROPOFOL N/A 10/11/2021   Procedure: ESOPHAGOGASTRODUODENOSCOPY (EGD) WITH PROPOFOL;  Surgeon: Arta Silence, MD;  Location: [facility];  Service: Gastroenterology;  Laterality: N/A;   EUS N/A 10/11/2021   Procedure: UPPER ENDOSCOPIC ULTRASOUND (EUS) LINEAR;  Surgeon: Arta Silence, MD;  Location: [facility];  Service: Gastroenterology;  Laterality: N/A;   FINE NEEDLE ASPIRATION N/A 10/11/2021   Procedure: FINE NEEDLE ASPIRATION (FNA) LINEAR;  Surgeon: Arta Silence, MD;  Location: [facility];  Service: Gastroenterology;  Laterality: N/A;   INGUINAL HERNIA REPAIR     KNEE ARTHROPLASTY Right 05/16/2017   Procedure: RIGHT TOTAL KNEE ARTHROPLASTY WITH COMPUTER NAVIGATION;  Surgeon: Rod Can, MD;  Location: [facility];  Service: Orthopedics;  Laterality: Right;  Needs RNFA   NECK SURGERY     SPHINCTEROTOMY  10/11/2021   Procedure: SPHINCTEROTOMY;  Surgeon: Ladene Artist, MD;  Location: [facility];  Service: Gastroenterology;;   [name]  11/21/2021   Procedure: [name];  Surgeon: Clarene Essex, MD;  Location: [facility];  Service: Gastroenterology;;   [name] CHOLANGIOSCOPY N/A 11/21/2021   Procedure: BTDVVOHY CHOLANGIOSCOPY;  Surgeon: Clarene Essex, MD;  Location: [facility];   Service: Gastroenterology;  Laterality: N/A;   STENT REMOVAL  11/21/2021   Procedure: STENT REMOVAL;  Surgeon: Clarene Essex, MD;  Location: [facility];  Service: Gastroenterology;;   TONSILLECTOMY      I have reviewed the social history and family history with the patient and they are unchanged from previous note.  ALLERGIES:  has No Known Allergies.  MEDICATIONS:  Current Outpatient Medications  Medication Sig Dispense Refill   ondansetron (ZOFRAN) 8 MG tablet Take 1 tablet (8 mg total) by mouth every 8 (eight) hours as needed for nausea or vomiting. 20 tablet 0   capecitabine (XELODA) 500 MG tablet Take 3 tablets (1,500 mg total) by mouth 2 (two) times daily. Take every 12 hours within 30 minutes after meals. Take only on days of radiation, Monday through Friday. 90 tablet 1   lactulose (CHRONULAC) 10 GM/15ML solution Take 30 mLs (20 g total) by mouth 2 (two) times daily as needed for mild constipation or moderate constipation. (Patient taking differently: Take 20 g by mouth 2 (two) times daily.) 473 mL 0   No current facility-administered medications for this visit.    PHYSICAL EXAMINATION: ECOG PERFORMANCE STATUS: 3 - Symptomatic, >50% confined to bed  Vitals:   12/12/21 1241  BP: 105/70  Pulse: (!) 56  Resp: 18  Temp: 98.2 F (36.8 C)  SpO2: 98%   Wt Readings from Last 3 Encounters:  12/12/21 162 lb 1.6 oz (73.5 kg)  11/27/21 170 lb 14.4 oz (77.5 kg)  11/27/21 169 lb 3.2 oz (76.7 kg)     GENERAL:alert, no distress and comfortable SKIN: skin color, texture, turgor are normal, no significant lesions, (+) rash to face and hands EYES: normal, Conjunctiva are pink and non-injected, sclera clear  Musculoskeletal:no cyanosis of digits and no clubbing  NEURO: alert & oriented x 3 with fluent speech, no focal motor/sensory deficits  LABORATORY DATA:  I have reviewed the data as listed    Latest Ref Rng & Units 12/12/2021   12:24 PM 12/04/2021    1:26 PM 10/27/2021   12:32 PM   CBC  WBC 4.0 - 10.5 K/uL 6.7  8.9  6.1   Hemoglobin 13.0 - 17.0 g/dL 12.3  12.6  12.1   Hematocrit 39.0 - 52.0 % 36.1  37.2  36.1   Platelets 150 - 400 K/uL 381  334  367         Latest Ref Rng & Units 12/12/2021   12:24 PM 12/04/2021    1:26 PM 11/27/2021    1:13 PM  CMP  Glucose 70 - 99 mg/dL 114  101  106   BUN 8 - 23 mg/dL _0 Creatinine 0.61 - 1.24 mg/dL 0.84  0.93  0.91   Sodium 135 - 145 mmol/L 135  132  136   Potassium 3.5 - 5.1 mmol/L 4.2  4.1  3.8   Chloride 98 - 111 mmol/L 100  98  102   CO2 22 -  32 mmol/L 32  31  24   Calcium 8.9 - 10.3 mg/dL 8.8  8.7  8.5   Total Protein 6.5 - 8.1 g/dL 6.9  7.5  7.1   Total Bilirubin 0.3 - 1.2 mg/dL 1.0  1.1  1.3   Alkaline Phos 38 - 126 U/L 86  120  93   AST 15 - 41 U/L _0 ALT 0 - 44 U/L _1 RADIOGRAPHIC STUDIES: I have personally reviewed the radiological images as listed and agreed with the findings in the report. No results found.    No orders of the defined types were placed in this encounter.  All questions were answered. The patient knows to call the clinic with any problems, questions or concerns. No barriers to learning was detected. The total time spent in the appointment was 30 minutes.     Truitt Merle, MD 12/12/2021   I, Wilburn Mylar, am acting as scribe for Truitt Merle, MD.   I have reviewed the above documentation for accuracy and completeness, and I agree with the above.

## 2021-12-13 ENCOUNTER — Other Ambulatory Visit: Payer: Self-pay

## 2021-12-13 ENCOUNTER — Ambulatory Visit
Admission: RE | Admit: 2021-12-13 | Discharge: 2021-12-13 | Disposition: A | Discharge status: Home / Self Care | Payer: Medicare Other | Source: Ambulatory Visit | Attending: Radiation Oncology | Admitting: Radiation Oncology

## 2021-12-13 DIAGNOSIS — C221 Intrahepatic bile duct carcinoma: Secondary | ICD-10-CM | POA: Diagnosis not present

## 2021-12-13 DIAGNOSIS — Z510 Encounter for antineoplastic radiation therapy: Secondary | ICD-10-CM | POA: Diagnosis not present

## 2021-12-13 LAB — RAD ONC ARIA SESSION SUMMARY
Course Elapsed Days: 9
Plan Fractions Treated to Date: 8
Plan Prescribed Dose Per Fraction: 1.8 Gy
Plan Total Fractions Prescribed: 25
Plan Total Prescribed Dose: 45 Gy
Reference Point Dosage Given to Date: 14.4 Gy
Reference Point Session Dosage Given: 1.8 Gy
Session Number: 8

## 2021-12-14 ENCOUNTER — Other Ambulatory Visit: Payer: Self-pay

## 2021-12-14 ENCOUNTER — Ambulatory Visit
Admission: RE | Admit: 2021-12-14 | Discharge: 2021-12-14 | Disposition: A | Discharge status: Home / Self Care | Payer: Medicare Other | Source: Ambulatory Visit | Attending: Radiation Oncology | Admitting: Radiation Oncology

## 2021-12-14 DIAGNOSIS — Z510 Encounter for antineoplastic radiation therapy: Secondary | ICD-10-CM | POA: Diagnosis not present

## 2021-12-14 DIAGNOSIS — C221 Intrahepatic bile duct carcinoma: Secondary | ICD-10-CM | POA: Diagnosis not present

## 2021-12-14 LAB — RAD ONC ARIA SESSION SUMMARY
Course Elapsed Days: 10
Plan Fractions Treated to Date: 9
Plan Prescribed Dose Per Fraction: 1.8 Gy
Plan Total Fractions Prescribed: 25
Plan Total Prescribed Dose: 45 Gy
Reference Point Dosage Given to Date: 16.2 Gy
Reference Point Session Dosage Given: 1.8 Gy
Session Number: 9

## 2021-12-15 ENCOUNTER — Other Ambulatory Visit: Payer: Self-pay

## 2021-12-15 ENCOUNTER — Ambulatory Visit
Admission: RE | Admit: 2021-12-15 | Discharge: 2021-12-15 | Disposition: A | Discharge status: Home / Self Care | Payer: Medicare Other | Source: Ambulatory Visit | Attending: Radiation Oncology | Admitting: Radiation Oncology

## 2021-12-15 ENCOUNTER — Other Ambulatory Visit (HOSPITAL_COMMUNITY): Payer: Self-pay

## 2021-12-15 DIAGNOSIS — Z510 Encounter for antineoplastic radiation therapy: Secondary | ICD-10-CM | POA: Diagnosis not present

## 2021-12-15 DIAGNOSIS — C221 Intrahepatic bile duct carcinoma: Secondary | ICD-10-CM | POA: Diagnosis not present

## 2021-12-15 LAB — RAD ONC ARIA SESSION SUMMARY
Course Elapsed Days: 11
Plan Fractions Treated to Date: 10
Plan Prescribed Dose Per Fraction: 1.8 Gy
Plan Total Fractions Prescribed: 25
Plan Total Prescribed Dose: 45 Gy
Reference Point Dosage Given to Date: 18 Gy
Reference Point Session Dosage Given: 1.8 Gy
Session Number: 10

## 2021-12-18 ENCOUNTER — Other Ambulatory Visit: Payer: Self-pay

## 2021-12-18 ENCOUNTER — Inpatient Hospital Stay (HOSPITAL_BASED_OUTPATIENT_CLINIC_OR_DEPARTMENT_OTHER): Payer: Medicare Other | Admitting: Hematology

## 2021-12-18 ENCOUNTER — Ambulatory Visit
Admission: RE | Admit: 2021-12-18 | Discharge: 2021-12-18 | Disposition: A | Discharge status: Home / Self Care | Payer: Medicare Other | Source: Ambulatory Visit | Attending: Radiation Oncology | Admitting: Radiation Oncology

## 2021-12-18 ENCOUNTER — Inpatient Hospital Stay: Payer: Medicare Other

## 2021-12-18 VITALS — BP 110/80 | HR 52 | Temp 98.1°F | Resp 18 | Ht 66.0 in | Wt 162.0 lb

## 2021-12-18 DIAGNOSIS — C221 Intrahepatic bile duct carcinoma: Secondary | ICD-10-CM | POA: Diagnosis not present

## 2021-12-18 DIAGNOSIS — Z510 Encounter for antineoplastic radiation therapy: Secondary | ICD-10-CM | POA: Diagnosis not present

## 2021-12-18 LAB — RAD ONC ARIA SESSION SUMMARY
Course Elapsed Days: 14
Plan Fractions Treated to Date: 11
Plan Prescribed Dose Per Fraction: 1.8 Gy
Plan Total Fractions Prescribed: 25
Plan Total Prescribed Dose: 45 Gy
Reference Point Dosage Given to Date: 19.8 Gy
Reference Point Session Dosage Given: 1.8 Gy
Session Number: 11

## 2021-12-18 LAB — COMPREHENSIVE METABOLIC PANEL
ALT: 11 U/L (ref 0–44)
AST: 18 U/L (ref 15–41)
Albumin: 3.1 g/dL — ABNORMAL LOW (ref 3.5–5.0)
Alkaline Phosphatase: 86 U/L (ref 38–126)
Anion gap: 3 — ABNORMAL LOW (ref 5–15)
BUN: 11 mg/dL (ref 8–23)
CO2: 33 mmol/L — ABNORMAL HIGH (ref 22–32)
Calcium: 8.9 mg/dL (ref 8.9–10.3)
Chloride: 100 mmol/L (ref 98–111)
Creatinine, Ser: 0.73 mg/dL (ref 0.61–1.24)
GFR, Estimated: >60 mL/min (ref >60)
Glucose, Bld: 121 mg/dL — ABNORMAL HIGH (ref 70–99)
Potassium: 3.8 mmol/L (ref 3.5–5.1)
Sodium: 136 mmol/L (ref 135–145)
Total Bilirubin: 0.7 mg/dL (ref 0.3–1.2)
Total Protein: 6.6 g/dL (ref 6.5–8.1)

## 2021-12-18 LAB — CBC WITH DIFFERENTIAL/PLATELET
Abs Immature Granulocytes: 0.04 10*3/uL (ref 0.00–0.07)
Basophils Absolute: 0 10*3/uL (ref 0.0–0.1)
Basophils Relative: 0 %
Eosinophils Absolute: 0.1 10*3/uL (ref 0.0–0.5)
Eosinophils Relative: 2 %
HCT: 35.2 % — ABNORMAL LOW (ref 39.0–52.0)
Hemoglobin: 12 g/dL — ABNORMAL LOW (ref 13.0–17.0)
Immature Granulocytes: 1 %
Lymphocytes Relative: 7 %
Lymphs Abs: 0.5 10*3/uL — ABNORMAL LOW (ref 0.7–4.0)
MCH: 32.7 pg (ref 26.0–34.0)
MCHC: 34.1 g/dL (ref 30.0–36.0)
MCV: 95.9 fL (ref 80.0–100.0)
Monocytes Absolute: 1.1 10*3/uL — ABNORMAL HIGH (ref 0.1–1.0)
Monocytes Relative: 16 %
Neutro Abs: 4.9 10*3/uL (ref 1.7–7.7)
Neutrophils Relative %: 74 %
Platelets: 295 10*3/uL (ref 150–400)
RBC: 3.67 MIL/uL — ABNORMAL LOW (ref 4.22–5.81)
RDW: 12.8 % (ref 11.5–15.5)
WBC: 6.7 10*3/uL (ref 4.0–10.5)
nRBC: 0 % (ref 0.0–0.2)

## 2021-12-18 NOTE — Progress Notes (Deleted)
HPI: Follow-up aortic stenosis.  Abdominal ultrasound January 2019 showed no aneurysm. Last echocardiogram January 2023 showed ejection fraction 60 to 65%, moderate aortic stenosis with mean gradient 21 mmHg and mildly dilated ascending aorta at 39 mm.  Chest CT July 2023 showed no metastatic disease but there was note of mitral valve, aortic valve calcification and coronary artery calcification.  Patient recently diagnosed with probable extrahepatic cholangiocarcinoma.  Since last seen   Current Outpatient Medications  Medication Sig Dispense Refill   capecitabine (XELODA) 500 MG tablet Take 3 tablets (1,500 mg total) by mouth 2 (two) times daily. Take every 12 hours within 30 minutes after meals. Take only on days of radiation, Monday through Friday. 90 tablet 1   lactulose (CHRONULAC) 10 GM/15ML solution Take 30 mLs (20 g total) by mouth 2 (two) times daily as needed for mild constipation or moderate constipation. (Patient taking differently: Take 20 g by mouth 2 (two) times daily.) 473 mL 0   ondansetron (ZOFRAN) 8 MG tablet Take 1 tablet (8 mg total) by mouth every 8 (eight) hours as needed for nausea or vomiting. 20 tablet 0   No current facility-administered medications for this visit.     Past Medical History:  Diagnosis Date   Arthritis    Back pain    Heart murmur    History of kidney stones    ICH (intracerebral hemorrhage) ([place])     Past Surgical History:  Procedure Laterality Date   BACK SURGERY     BILIARY BRUSHING  10/11/2021   Procedure: BILIARY BRUSHING;  Surgeon: Ladene Artist, MD;  Location: [facility];  Service: Gastroenterology;;   BILIARY BRUSHING  11/21/2021   Procedure: BILIARY BRUSHING;  Surgeon: Clarene Essex, MD;  Location: [facility];  Service: Gastroenterology;;   BILIARY STENT PLACEMENT N/A 10/11/2021   Procedure: BILIARY STENT PLACEMENT;  Surgeon: Ladene Artist, MD;  Location: [facility];  Service: Gastroenterology;  Laterality: N/A;   BILIARY  STENT PLACEMENT N/A 11/21/2021   Procedure: BILIARY STENT PLACEMENT;  Surgeon: Clarene Essex, MD;  Location: [facility];  Service: Gastroenterology;  Laterality: N/A;   BIOPSY  11/21/2021   Procedure: BIOPSY;  Surgeon: Clarene Essex, MD;  Location: [facility];  Service: Gastroenterology;;   ERCP N/A 10/11/2021   Procedure: ENDOSCOPIC RETROGRADE CHOLANGIOPANCREATOGRAPHY (ERCP);  Surgeon: Ladene Artist, MD;  Location: [facility];  Service: Gastroenterology;  Laterality: N/A;   ERCP N/A 11/21/2021   Procedure: ENDOSCOPIC RETROGRADE CHOLANGIOPANCREATOGRAPHY (ERCP);  Surgeon: Clarene Essex, MD;  Location: [facility];  Service: Gastroenterology;  Laterality: N/A;  with Spyglass   ESOPHAGOGASTRODUODENOSCOPY (EGD) WITH PROPOFOL N/A 10/11/2021   Procedure: ESOPHAGOGASTRODUODENOSCOPY (EGD) WITH PROPOFOL;  Surgeon: Arta Silence, MD;  Location: [facility];  Service: Gastroenterology;  Laterality: N/A;   EUS N/A 10/11/2021   Procedure: UPPER ENDOSCOPIC ULTRASOUND (EUS) LINEAR;  Surgeon: Arta Silence, MD;  Location: [facility];  Service: Gastroenterology;  Laterality: N/A;   FINE NEEDLE ASPIRATION N/A 10/11/2021   Procedure: FINE NEEDLE ASPIRATION (FNA) LINEAR;  Surgeon: Arta Silence, MD;  Location: [facility];  Service: Gastroenterology;  Laterality: N/A;   INGUINAL HERNIA REPAIR     KNEE ARTHROPLASTY Right 05/16/2017   Procedure: RIGHT TOTAL KNEE ARTHROPLASTY WITH COMPUTER NAVIGATION;  Surgeon: Rod Can, MD;  Location: [facility];  Service: Orthopedics;  Laterality: Right;  Needs RNFA   NECK SURGERY     SPHINCTEROTOMY  10/11/2021   Procedure: SPHINCTEROTOMY;  Surgeon: Ladene Artist, MD;  Location: [facility];  Service: Gastroenterology;;  SPHINCTEROTOMY  11/21/2021   Procedure: SPHINCTEROTOMY;  Surgeon: Clarene Essex, MD;  Location: [facility];  Service: Gastroenterology;;   [name] CHOLANGIOSCOPY N/A 11/21/2021   Procedure: APOLIDCV CHOLANGIOSCOPY;  Surgeon: Clarene Essex, MD;  Location: [facility];  Service: Gastroenterology;  Laterality: N/A;   STENT REMOVAL  11/21/2021   Procedure: STENT REMOVAL;  Surgeon: Clarene Essex, MD;  Location: [facility];  Service: Gastroenterology;;   TONSILLECTOMY      Social History   Socioeconomic History   Marital status: Married    Spouse name: Not on file   Number of children: 3   Years of education: Not on file   Highest education level: Not on file  Occupational History    Comment: Retired  Tobacco Use   Smoking status: Former   Smokeless tobacco: Never  Scientific laboratory technician Use: Never used  Substance and Sexual Activity   Alcohol use: Yes    Comment: one beer per day   Drug use: No   Sexual activity: Not on file  Other Topics Concern   Not on file  Social History Narrative   Not on file   Social Determinants of Health   Financial Resource Strain: Not on file  Food Insecurity: Not on file  Transportation Needs: Not on file  Physical Activity: Not on file  Stress: Not on file  Social Connections: Not on file  Intimate Partner Violence: Not on file    Family History  Problem Relation Age of Onset   Diabetes Mother     ROS: no fevers or chills, productive cough, hemoptysis, dysphasia, odynophagia, melena, hematochezia, dysuria, hematuria, rash, seizure activity, orthopnea, PND, pedal edema, claudication. Remaining systems are negative.  Physical Exam: Well-developed well-nourished in no acute distress.  Skin is warm and dry.  HEENT is normal.  Neck is supple.  Chest is clear to auscultation with normal expansion.  Cardiovascular exam is regular rate and rhythm.  Abdominal exam nontender or distended. No masses palpated. Extremities show no edema. neuro grossly intact  ECG- personally reviewed  A/P  1 aortic stenosis-patient's AS noted to be moderate on most recent echocardiogram.  He remains asymptomatic with no dyspnea, chest pain or syncope.  Plan follow-up echocardiogram January 2024.    2 recently  diagnosed extrahepatic cholangiocarcinoma-managed by oncology.  Kirk Ruths, MD

## 2021-12-18 NOTE — Progress Notes (Signed)
[place]   [phone] [phone]   Clinic Follow up Note   Patient Care Team: London Pepper, MD as PCP - General (Family Medicine) Stanford Breed Denice Bors, MD as PCP - Cardiology (Cardiology) Lelon Perla, MD as Consulting Physician (Cardiology)  Date of Service:  12/18/2021  CHIEF COMPLAINT: f/u of suspected cholangiocarcinoma  CURRENT THERAPY:  Concurrent chemoRT with Xeloda, on 12/04/21             -Xeloda dose: 1536m BID, M-F  ASSESSMENT & PLAN:  Patrick MATHIEUis a 81y.o. male with   1. 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 CT AP 10/06/21 showed biliary obstruction, he was referred to ED. -baseline CA 19-9 on 10/06/21 elevated to 129. -s/p ERCP 10/11/21 inpatient showed a mass in common bile duct, cytology not diagnostic showing only suspicious atypical cells. S/p stent placement  -staging chest CT 11/03/21 was negative. -he met Dr. AZenia Resideson 11/08/21 and was offered surgery, but pt declined. -repeat ERCP on 11/21/21 for stent change, path and cytology were again non-diagnostic but suspicious. -he began radiation with concurrent Xeloda on 12/04/21. He is tolerating moderately well but has experienced nausea and weight loss. He also developed a rash to his hands and face. We held Xeloda last week, 8/22. He still has a mild rash, but his nausea has subsided and his weight is stable. We will continue holding Xeloda for now. -labs reviewed, his mild anemia is stable. Otherwise no concern. Will continue to monitor.   2. Symptom Management: Nausea, Weight loss -his baseline weight was 187 lbs in 06/2021; he was down to 162 lbs on 12/12/21. Weight is stable over the last week.     PLAN: -continue daily radiation -hold Xeloda -f/u as scheduled   No problem-specific Assessment & Plan notes found for this encounter.   SUMMARY OF ONCOLOGIC HISTORY: Oncology History   Cholangiocarcinoma ([place]  10/07/2021 Imaging   MR ABDOMEN MRCP W WO CONTAST  IMPRESSION: 1. Severe biliary ductal dilation of both the intra and extrahepatic bile ducts with abrupt cutoff seen at the distal common bile duct. Findings are concerning for stricture or obstructing tumor. No obstructing mass is seen, although motion artifact on contrast-enhanced imaging limits evaluation. Recommend correlation with ERCP. 2. Small cystic lesions of the pancreatic tail, largest measures 10 mm. Recommend follow up pre and post contrast MRI/MRCP or pancreatic protocol CT in 2 years. This recommendation follows ACR consensus guidelines: Management of Incidental Pancreatic Cysts: A White Paper of the ACR Incidental Findings Committee. J Am Coll Radiol 29163;84:665-993 3. Trace perihepatic ascites and trace bilateral pleural effusions.   10/11/2021 Procedure   ERCP-Dr. SFuller Plan Impression:  -I single localized malignant appearing severe biliary stricture was found in the lower third of the main bile duct. -The proximal biliary tree was dilated, secondary to stricture -Biliary sphincterotomy was performed -Cells for cytology obtained in the lower third of the main duct -One plastic stent was placed into the common bile duct.     10/11/2021 Pathology Results   CYTOLOGY - NON PAP  CASE: [id number]  PATIENT: Patrick Collier Non-Gynecological Cytology Report   FINAL MICROSCOPIC DIAGNOSIS:  A. COMMON BILE DUCT, LESION, FINE NEEDLE ASPIRATION:  - No malignant cells seen  -Scant cellularity; benign/reactive ductal cells and ductal epithelium  with acute inflammatory cells   B. COMMON BILE DUCT, STRICTURE, BRUSHING:  - Atypical cells suspicious for tumor present    10/26/2021 Initial  Diagnosis   Cholangiocarcinoma ([place])    Procedure   Upper EUS-Dr. Paulita Fujita  Impression:  - There was no sign of significant pathology in the ampulla. - A mass was found in the common bile duct. Fine needle  aspiration performed. - There was no sign of significant pathology in the pancreatic head, genu of the pancreas and pancreatic body. - A few abnormal lymph nodes were visualized in the peripancreatic region and porta hepatis region. - There was dilation in the middle third of the main bile duct, in the upper third of the main bile duct and in the common hepatic duct which measured up to 15 mm. - Small perihepatic ascites. - Overall constellation findings most consistent mid common bile duct cholangiocarcinoma.   11/21/2021 Cancer Staging   Staging form: Distal Bile Duct, AJCC 8th Edition - Clinical stage from 11/21/2021: Stage Unknown (cTX, cN0, cM0) - Signed by Truitt Merle, MD on 12/12/2021 Total positive nodes: 0      INTERVAL HISTORY:  Patrick Collier is here for a follow up of suspected cholangiocarcinoma. He was last seen by me on 12/12/21. He presents to the clinic accompanied by his son. He reports he feels about the same off the Xeloda. He notes his rash is not bothersome. He denies nausea now. He reports loose BM.   All other systems were reviewed with the patient and are negative.  MEDICAL HISTORY:  Past Medical History:  Diagnosis Date   Arthritis    Back pain    Heart murmur    History of kidney stones    ICH (intracerebral hemorrhage) ([place])     SURGICAL HISTORY: Past Surgical History:  Procedure Laterality Date   BACK SURGERY     BILIARY BRUSHING  10/11/2021   Procedure: BILIARY BRUSHING;  Surgeon: Ladene Artist, MD;  Location: [facility];  Service: Gastroenterology;;   BILIARY BRUSHING  11/21/2021   Procedure: BILIARY BRUSHING;  Surgeon: Clarene Essex, MD;  Location: [facility];  Service: Gastroenterology;;   BILIARY STENT PLACEMENT N/A 10/11/2021   Procedure: BILIARY STENT PLACEMENT;  Surgeon: Ladene Artist, MD;  Location: [facility];  Service: Gastroenterology;  Laterality: N/A;   BILIARY STENT PLACEMENT N/A 11/21/2021   Procedure: BILIARY STENT PLACEMENT;   Surgeon: Clarene Essex, MD;  Location: [facility];  Service: Gastroenterology;  Laterality: N/A;   BIOPSY  11/21/2021   Procedure: BIOPSY;  Surgeon: Clarene Essex, MD;  Location: [facility];  Service: Gastroenterology;;   ERCP N/A 10/11/2021   Procedure: ENDOSCOPIC RETROGRADE CHOLANGIOPANCREATOGRAPHY (ERCP);  Surgeon: Ladene Artist, MD;  Location: [facility];  Service: Gastroenterology;  Laterality: N/A;   ERCP N/A 11/21/2021   Procedure: ENDOSCOPIC RETROGRADE CHOLANGIOPANCREATOGRAPHY (ERCP);  Surgeon: Clarene Essex, MD;  Location: [facility];  Service: Gastroenterology;  Laterality: N/A;  with Spyglass   ESOPHAGOGASTRODUODENOSCOPY (EGD) WITH PROPOFOL N/A 10/11/2021   Procedure: ESOPHAGOGASTRODUODENOSCOPY (EGD) WITH PROPOFOL;  Surgeon: Arta Silence, MD;  Location: [facility];  Service: Gastroenterology;  Laterality: N/A;   EUS N/A 10/11/2021   Procedure: UPPER ENDOSCOPIC ULTRASOUND (EUS) LINEAR;  Surgeon: Arta Silence, MD;  Location: [facility];  Service: Gastroenterology;  Laterality: N/A;   FINE NEEDLE ASPIRATION N/A 10/11/2021   Procedure: FINE NEEDLE ASPIRATION (FNA) LINEAR;  Surgeon: Arta Silence, MD;  Location: [facility];  Service: Gastroenterology;  Laterality: N/A;   INGUINAL HERNIA REPAIR     KNEE ARTHROPLASTY Right 05/16/2017   Procedure: RIGHT TOTAL KNEE ARTHROPLASTY WITH COMPUTER NAVIGATION;  Surgeon: Rod Can, MD;  Location: [facility];  Service: Orthopedics;  Laterality: Right;  Needs RNFA   NECK SURGERY     SPHINCTEROTOMY  10/11/2021   Procedure: SPHINCTEROTOMY;  Surgeon: Ladene Artist, MD;  Location: [facility];  Service: Gastroenterology;;   [name]  11/21/2021   Procedure: [name];  Surgeon: Clarene Essex, MD;  Location: [facility];  Service: Gastroenterology;;   [name] CHOLANGIOSCOPY N/A 11/21/2021   Procedure: ZOXWRUEA CHOLANGIOSCOPY;  Surgeon: Clarene Essex, MD;  Location: [facility];  Service: Gastroenterology;  Laterality: N/A;   STENT  REMOVAL  11/21/2021   Procedure: STENT REMOVAL;  Surgeon: Clarene Essex, MD;  Location: [facility];  Service: Gastroenterology;;   TONSILLECTOMY      I have reviewed the social history and family history with the patient and they are unchanged from previous note.  ALLERGIES:  has No Known Allergies.  MEDICATIONS:  Current Outpatient Medications  Medication Sig Dispense Refill   capecitabine (XELODA) 500 MG tablet Take 3 tablets (1,500 mg total) by mouth 2 (two) times daily. Take every 12 hours within 30 minutes after meals. Take only on days of radiation, Monday through Friday. 90 tablet 1   lactulose (CHRONULAC) 10 GM/15ML solution Take 30 mLs (20 g total) by mouth 2 (two) times daily as needed for mild constipation or moderate constipation. (Patient taking differently: Take 20 g by mouth 2 (two) times daily.) 473 mL 0   ondansetron (ZOFRAN) 8 MG tablet Take 1 tablet (8 mg total) by mouth every 8 (eight) hours as needed for nausea or vomiting. 20 tablet 0   No current facility-administered medications for this visit.    PHYSICAL EXAMINATION: ECOG PERFORMANCE STATUS: {CHL ONC ECOG VW:[id number]}  Vitals:   12/18/21 1244  BP: 110/80  Pulse: (!) 52  Resp: 18  Temp: 98.1 F (36.7 C)  SpO2: 100%   Wt Readings from Last 3 Encounters:  12/18/21 162 lb (73.5 kg)  12/12/21 162 lb 1.6 oz (73.5 kg)  11/27/21 170 lb 14.4 oz (77.5 kg)     GENERAL:alert, no distress and comfortable SKIN: skin color normal, no rashes or significant lesions EYES: normal, Conjunctiva are pink and non-injected, sclera clear  NEURO: alert & oriented x 3 with fluent speech  LABORATORY DATA:  I have reviewed the data as listed    Latest Ref Rng & Units 12/18/2021   12:28 PM 12/12/2021   12:24 PM 12/04/2021    1:26 PM  CBC  WBC 4.0 - 10.5 K/uL 6.7  6.7  8.9   Hemoglobin 13.0 - 17.0 g/dL 12.0  12.3  12.6   Hematocrit 39.0 - 52.0 % 35.2  36.1  37.2   Platelets 150 - 400 K/uL 295  381  334         Latest  Ref Rng & Units 12/18/2021   12:28 PM 12/12/2021   12:24 PM 12/04/2021    1:26 PM  CMP  Glucose 70 - 99 mg/dL 121  114  101   BUN 8 - 23 mg/dL 11  11  11    Creatinine 0.61 - 1.24 mg/dL 0.73  0.84  0.93   Sodium 135 - 145 mmol/L 136  135  132   Potassium 3.5 - 5.1 mmol/L 3.8  4.2  4.1   Chloride 98 - 111 mmol/L 100  100  98   CO2 22 - 32 mmol/L 33  32  31   Calcium 8.9 - 10.3 mg/dL 8.9  8.8  8.7   Total Protein 6.5 - 8.1 g/dL 6.6  6.9  7.5  Total Bilirubin 0.3 - 1.2 mg/dL 0.7  1.0  1.1   Alkaline Phos 38 - 126 U/L 86  86  120   AST 15 - 41 U/L 18  18  19    ALT 0 - 44 U/L 11  11  16        RADIOGRAPHIC STUDIES: I have personally reviewed the radiological images as listed and agreed with the findings in the report. No results found.    No orders of the defined types were placed in this encounter.  All questions were answered. The patient knows to call the clinic with any problems, questions or concerns. No barriers to learning was detected. The total time spent in the appointment was {CHL ONC TIME VISIT - JLTHF:[id number]}.     Truitt Merle, MD 12/18/2021   I, Wilburn Mylar, am acting as scribe for Truitt Merle, MD.   {Add scribe attestation statement}

## 2021-12-19 ENCOUNTER — Other Ambulatory Visit: Payer: Self-pay

## 2021-12-19 ENCOUNTER — Ambulatory Visit
Admission: RE | Admit: 2021-12-19 | Discharge: 2021-12-19 | Disposition: A | Discharge status: Home / Self Care | Payer: Medicare Other | Source: Ambulatory Visit | Attending: Radiation Oncology | Admitting: Radiation Oncology

## 2021-12-19 DIAGNOSIS — Z510 Encounter for antineoplastic radiation therapy: Secondary | ICD-10-CM | POA: Diagnosis not present

## 2021-12-19 DIAGNOSIS — C221 Intrahepatic bile duct carcinoma: Secondary | ICD-10-CM | POA: Diagnosis not present

## 2021-12-19 LAB — RAD ONC ARIA SESSION SUMMARY
Course Elapsed Days: 15
Plan Fractions Treated to Date: 12
Plan Prescribed Dose Per Fraction: 1.8 Gy
Plan Total Fractions Prescribed: 25
Plan Total Prescribed Dose: 45 Gy
Reference Point Dosage Given to Date: 21.6 Gy
Reference Point Session Dosage Given: 1.8 Gy
Session Number: 12

## 2021-12-20 ENCOUNTER — Other Ambulatory Visit: Payer: Self-pay

## 2021-12-20 ENCOUNTER — Ambulatory Visit
Admission: RE | Admit: 2021-12-20 | Discharge: 2021-12-20 | Disposition: A | Discharge status: Home / Self Care | Payer: Medicare Other | Source: Ambulatory Visit | Attending: Radiation Oncology | Admitting: Radiation Oncology

## 2021-12-20 DIAGNOSIS — C221 Intrahepatic bile duct carcinoma: Secondary | ICD-10-CM | POA: Diagnosis not present

## 2021-12-20 DIAGNOSIS — Z510 Encounter for antineoplastic radiation therapy: Secondary | ICD-10-CM | POA: Diagnosis not present

## 2021-12-20 LAB — RAD ONC ARIA SESSION SUMMARY
Course Elapsed Days: 16
Plan Fractions Treated to Date: 13
Plan Prescribed Dose Per Fraction: 1.8 Gy
Plan Total Fractions Prescribed: 25
Plan Total Prescribed Dose: 45 Gy
Reference Point Dosage Given to Date: 23.4 Gy
Reference Point Session Dosage Given: 1.8 Gy
Session Number: 13

## 2021-12-21 ENCOUNTER — Ambulatory Visit
Admission: RE | Admit: 2021-12-21 | Discharge: 2021-12-21 | Disposition: A | Discharge status: Home / Self Care | Payer: Medicare Other | Source: Ambulatory Visit | Attending: Radiation Oncology | Admitting: Radiation Oncology

## 2021-12-21 ENCOUNTER — Other Ambulatory Visit: Payer: Self-pay

## 2021-12-21 DIAGNOSIS — Z510 Encounter for antineoplastic radiation therapy: Secondary | ICD-10-CM | POA: Diagnosis not present

## 2021-12-21 DIAGNOSIS — C221 Intrahepatic bile duct carcinoma: Secondary | ICD-10-CM | POA: Diagnosis not present

## 2021-12-21 LAB — RAD ONC ARIA SESSION SUMMARY
Course Elapsed Days: 17
Plan Fractions Treated to Date: 14
Plan Prescribed Dose Per Fraction: 1.8 Gy
Plan Total Fractions Prescribed: 25
Plan Total Prescribed Dose: 45 Gy
Reference Point Dosage Given to Date: 25.2 Gy
Reference Point Session Dosage Given: 1.8 Gy
Session Number: 14

## 2021-12-22 ENCOUNTER — Ambulatory Visit
Admission: RE | Admit: 2021-12-22 | Discharge: 2021-12-22 | Disposition: A | Discharge status: Home / Self Care | Payer: Medicare Other | Source: Ambulatory Visit | Attending: Radiation Oncology | Admitting: Radiation Oncology

## 2021-12-22 ENCOUNTER — Other Ambulatory Visit: Payer: Self-pay

## 2021-12-22 ENCOUNTER — Other Ambulatory Visit (HOSPITAL_COMMUNITY): Payer: Self-pay

## 2021-12-22 DIAGNOSIS — Z510 Encounter for antineoplastic radiation therapy: Secondary | ICD-10-CM | POA: Insufficient documentation

## 2021-12-22 DIAGNOSIS — C221 Intrahepatic bile duct carcinoma: Secondary | ICD-10-CM | POA: Insufficient documentation

## 2021-12-22 DIAGNOSIS — R6883 Chills (without fever): Secondary | ICD-10-CM | POA: Insufficient documentation

## 2021-12-22 DIAGNOSIS — Z119 Encounter for screening for infectious and parasitic diseases, unspecified: Secondary | ICD-10-CM | POA: Insufficient documentation

## 2021-12-22 LAB — RAD ONC ARIA SESSION SUMMARY
Course Elapsed Days: 18
Plan Fractions Treated to Date: 15
Plan Prescribed Dose Per Fraction: 1.8 Gy
Plan Total Fractions Prescribed: 25
Plan Total Prescribed Dose: 45 Gy
Reference Point Dosage Given to Date: 27 Gy
Reference Point Session Dosage Given: 1.8 Gy
Session Number: 15

## 2021-12-26 ENCOUNTER — Inpatient Hospital Stay: Payer: Medicare Other

## 2021-12-26 ENCOUNTER — Ambulatory Visit
Admission: RE | Admit: 2021-12-26 | Discharge: 2021-12-26 | Disposition: A | Discharge status: Home / Self Care | Payer: Medicare Other | Source: Ambulatory Visit | Attending: Radiation Oncology | Admitting: Radiation Oncology

## 2021-12-26 ENCOUNTER — Other Ambulatory Visit (HOSPITAL_COMMUNITY): Payer: Self-pay

## 2021-12-26 ENCOUNTER — Inpatient Hospital Stay: Payer: Medicare Other | Admitting: Hematology

## 2021-12-26 ENCOUNTER — Other Ambulatory Visit: Payer: Self-pay

## 2021-12-26 DIAGNOSIS — Z510 Encounter for antineoplastic radiation therapy: Secondary | ICD-10-CM | POA: Diagnosis not present

## 2021-12-26 DIAGNOSIS — C221 Intrahepatic bile duct carcinoma: Secondary | ICD-10-CM | POA: Diagnosis not present

## 2021-12-26 LAB — RAD ONC ARIA SESSION SUMMARY
Course Elapsed Days: 22
Plan Fractions Treated to Date: 16
Plan Prescribed Dose Per Fraction: 1.8 Gy
Plan Total Fractions Prescribed: 25
Plan Total Prescribed Dose: 45 Gy
Reference Point Dosage Given to Date: 28.8 Gy
Reference Point Session Dosage Given: 1.8 Gy
Session Number: 16

## 2021-12-27 ENCOUNTER — Other Ambulatory Visit: Payer: Self-pay

## 2021-12-27 ENCOUNTER — Ambulatory Visit
Admission: RE | Admit: 2021-12-27 | Discharge: 2021-12-27 | Disposition: A | Discharge status: Home / Self Care | Payer: Medicare Other | Source: Ambulatory Visit | Attending: Radiation Oncology | Admitting: Radiation Oncology

## 2021-12-27 DIAGNOSIS — Z510 Encounter for antineoplastic radiation therapy: Secondary | ICD-10-CM | POA: Diagnosis not present

## 2021-12-27 DIAGNOSIS — C221 Intrahepatic bile duct carcinoma: Secondary | ICD-10-CM | POA: Diagnosis not present

## 2021-12-27 LAB — RAD ONC ARIA SESSION SUMMARY
Course Elapsed Days: 23
Plan Fractions Treated to Date: 17
Plan Prescribed Dose Per Fraction: 1.8 Gy
Plan Total Fractions Prescribed: 25
Plan Total Prescribed Dose: 45 Gy
Reference Point Dosage Given to Date: 30.6 Gy
Reference Point Session Dosage Given: 1.8 Gy
Session Number: 17

## 2021-12-28 ENCOUNTER — Ambulatory Visit
Admission: RE | Admit: 2021-12-28 | Discharge: 2021-12-28 | Disposition: A | Discharge status: Home / Self Care | Payer: Medicare Other | Source: Ambulatory Visit | Attending: Radiation Oncology | Admitting: Radiation Oncology

## 2021-12-28 ENCOUNTER — Other Ambulatory Visit: Payer: Self-pay

## 2021-12-28 DIAGNOSIS — C221 Intrahepatic bile duct carcinoma: Secondary | ICD-10-CM | POA: Diagnosis not present

## 2021-12-28 DIAGNOSIS — Z510 Encounter for antineoplastic radiation therapy: Secondary | ICD-10-CM | POA: Diagnosis not present

## 2021-12-28 LAB — RAD ONC ARIA SESSION SUMMARY
Course Elapsed Days: 24
Plan Fractions Treated to Date: 18
Plan Prescribed Dose Per Fraction: 1.8 Gy
Plan Total Fractions Prescribed: 25
Plan Total Prescribed Dose: 45 Gy
Reference Point Dosage Given to Date: 32.4 Gy
Reference Point Session Dosage Given: 1.8 Gy
Session Number: 18

## 2021-12-29 ENCOUNTER — Ambulatory Visit: Payer: Medicare Other | Admitting: Cardiology

## 2021-12-29 ENCOUNTER — Ambulatory Visit
Admission: RE | Admit: 2021-12-29 | Discharge: 2021-12-29 | Disposition: A | Discharge status: Home / Self Care | Payer: Medicare Other | Source: Ambulatory Visit | Attending: Radiation Oncology | Admitting: Radiation Oncology

## 2021-12-29 ENCOUNTER — Other Ambulatory Visit (HOSPITAL_COMMUNITY): Payer: Self-pay

## 2021-12-29 ENCOUNTER — Other Ambulatory Visit: Payer: Self-pay

## 2021-12-29 DIAGNOSIS — R011 Cardiac murmur, unspecified: Secondary | ICD-10-CM | POA: Diagnosis present

## 2021-12-29 DIAGNOSIS — M545 Low back pain, unspecified: Secondary | ICD-10-CM | POA: Diagnosis present

## 2021-12-29 DIAGNOSIS — Z20822 Contact with and (suspected) exposure to covid-19: Secondary | ICD-10-CM | POA: Diagnosis present

## 2021-12-29 DIAGNOSIS — D696 Thrombocytopenia, unspecified: Secondary | ICD-10-CM | POA: Diagnosis not present

## 2021-12-29 DIAGNOSIS — K8309 Other cholangitis: Secondary | ICD-10-CM | POA: Diagnosis not present

## 2021-12-29 DIAGNOSIS — G9341 Metabolic encephalopathy: Secondary | ICD-10-CM | POA: Diagnosis not present

## 2021-12-29 DIAGNOSIS — Z8509 Personal history of malignant neoplasm of other digestive organs: Secondary | ICD-10-CM | POA: Diagnosis not present

## 2021-12-29 DIAGNOSIS — K810 Acute cholecystitis: Secondary | ICD-10-CM | POA: Diagnosis not present

## 2021-12-29 DIAGNOSIS — I10 Essential (primary) hypertension: Secondary | ICD-10-CM | POA: Diagnosis present

## 2021-12-29 DIAGNOSIS — A419 Sepsis, unspecified organism: Secondary | ICD-10-CM | POA: Diagnosis not present

## 2021-12-29 DIAGNOSIS — R6883 Chills (without fever): Secondary | ICD-10-CM | POA: Diagnosis not present

## 2021-12-29 DIAGNOSIS — G8929 Other chronic pain: Secondary | ICD-10-CM | POA: Diagnosis present

## 2021-12-29 DIAGNOSIS — R059 Cough, unspecified: Secondary | ICD-10-CM | POA: Diagnosis not present

## 2021-12-29 DIAGNOSIS — R4182 Altered mental status, unspecified: Secondary | ICD-10-CM | POA: Diagnosis not present

## 2021-12-29 DIAGNOSIS — K822 Perforation of gallbladder: Secondary | ICD-10-CM | POA: Diagnosis not present

## 2021-12-29 DIAGNOSIS — R509 Fever, unspecified: Secondary | ICD-10-CM | POA: Diagnosis not present

## 2021-12-29 DIAGNOSIS — M479 Spondylosis, unspecified: Secondary | ICD-10-CM | POA: Diagnosis present

## 2021-12-29 DIAGNOSIS — Z833 Family history of diabetes mellitus: Secondary | ICD-10-CM | POA: Diagnosis not present

## 2021-12-29 DIAGNOSIS — D6959 Other secondary thrombocytopenia: Secondary | ICD-10-CM | POA: Diagnosis present

## 2021-12-29 DIAGNOSIS — I700 Atherosclerosis of aorta: Secondary | ICD-10-CM | POA: Diagnosis not present

## 2021-12-29 DIAGNOSIS — C240 Malignant neoplasm of extrahepatic bile duct: Secondary | ICD-10-CM | POA: Diagnosis not present

## 2021-12-29 DIAGNOSIS — I350 Nonrheumatic aortic (valve) stenosis: Secondary | ICD-10-CM | POA: Diagnosis present

## 2021-12-29 DIAGNOSIS — R109 Unspecified abdominal pain: Secondary | ICD-10-CM | POA: Diagnosis not present

## 2021-12-29 DIAGNOSIS — Z9221 Personal history of antineoplastic chemotherapy: Secondary | ICD-10-CM | POA: Diagnosis not present

## 2021-12-29 DIAGNOSIS — Z66 Do not resuscitate: Secondary | ICD-10-CM | POA: Diagnosis present

## 2021-12-29 DIAGNOSIS — Z510 Encounter for antineoplastic radiation therapy: Secondary | ICD-10-CM | POA: Diagnosis not present

## 2021-12-29 DIAGNOSIS — E440 Moderate protein-calorie malnutrition: Secondary | ICD-10-CM | POA: Diagnosis present

## 2021-12-29 DIAGNOSIS — M1711 Unilateral primary osteoarthritis, right knee: Secondary | ICD-10-CM | POA: Diagnosis present

## 2021-12-29 DIAGNOSIS — Z87891 Personal history of nicotine dependence: Secondary | ICD-10-CM | POA: Diagnosis not present

## 2021-12-29 DIAGNOSIS — Z96651 Presence of right artificial knee joint: Secondary | ICD-10-CM | POA: Diagnosis present

## 2021-12-29 DIAGNOSIS — C221 Intrahepatic bile duct carcinoma: Secondary | ICD-10-CM | POA: Diagnosis not present

## 2021-12-29 LAB — RAD ONC ARIA SESSION SUMMARY
Course Elapsed Days: 25
Plan Fractions Treated to Date: 19
Plan Prescribed Dose Per Fraction: 1.8 Gy
Plan Total Fractions Prescribed: 25
Plan Total Prescribed Dose: 45 Gy
Reference Point Dosage Given to Date: 34.2 Gy
Reference Point Session Dosage Given: 1.8 Gy
Session Number: 19

## 2022-01-01 ENCOUNTER — Other Ambulatory Visit: Payer: Self-pay

## 2022-01-01 ENCOUNTER — Emergency Department (HOSPITAL_COMMUNITY): Payer: Medicare Other

## 2022-01-01 ENCOUNTER — Telehealth: Payer: Self-pay

## 2022-01-01 ENCOUNTER — Inpatient Hospital Stay (HOSPITAL_BASED_OUTPATIENT_CLINIC_OR_DEPARTMENT_OTHER): Payer: Medicare Other | Admitting: Hematology

## 2022-01-01 ENCOUNTER — Ambulatory Visit
Admission: RE | Admit: 2022-01-01 | Discharge: 2022-01-01 | Disposition: A | Discharge status: Home / Self Care | Payer: Medicare Other | Source: Ambulatory Visit | Attending: Radiation Oncology | Admitting: Radiation Oncology

## 2022-01-01 ENCOUNTER — Inpatient Hospital Stay (HOSPITAL_COMMUNITY)
Admission: EM | Admit: 2022-01-01 | Discharge: 2022-01-04 | DRG: 444 | Disposition: A | Discharge status: Home Health | Payer: Medicare Other | Attending: Family Medicine | Admitting: Family Medicine

## 2022-01-01 ENCOUNTER — Encounter: Payer: Self-pay | Admitting: Hematology

## 2022-01-01 ENCOUNTER — Inpatient Hospital Stay: Payer: Medicare Other | Attending: Physician Assistant

## 2022-01-01 ENCOUNTER — Encounter (HOSPITAL_COMMUNITY): Payer: Self-pay

## 2022-01-01 VITALS — BP 109/74 | HR 75 | Temp 98.4°F | Resp 18

## 2022-01-01 DIAGNOSIS — R059 Cough, unspecified: Secondary | ICD-10-CM | POA: Diagnosis not present

## 2022-01-01 DIAGNOSIS — R7881 Bacteremia: Secondary | ICD-10-CM | POA: Insufficient documentation

## 2022-01-01 DIAGNOSIS — Z9221 Personal history of antineoplastic chemotherapy: Secondary | ICD-10-CM | POA: Diagnosis not present

## 2022-01-01 DIAGNOSIS — C221 Intrahepatic bile duct carcinoma: Secondary | ICD-10-CM | POA: Diagnosis present

## 2022-01-01 DIAGNOSIS — A419 Sepsis, unspecified organism: Secondary | ICD-10-CM | POA: Diagnosis not present

## 2022-01-01 DIAGNOSIS — Z96651 Presence of right artificial knee joint: Secondary | ICD-10-CM | POA: Diagnosis present

## 2022-01-01 DIAGNOSIS — D696 Thrombocytopenia, unspecified: Secondary | ICD-10-CM | POA: Diagnosis not present

## 2022-01-01 DIAGNOSIS — R011 Cardiac murmur, unspecified: Secondary | ICD-10-CM | POA: Diagnosis present

## 2022-01-01 DIAGNOSIS — K810 Acute cholecystitis: Principal | ICD-10-CM | POA: Diagnosis present

## 2022-01-01 DIAGNOSIS — Z8509 Personal history of malignant neoplasm of other digestive organs: Secondary | ICD-10-CM

## 2022-01-01 DIAGNOSIS — M545 Low back pain, unspecified: Secondary | ICD-10-CM | POA: Diagnosis present

## 2022-01-01 DIAGNOSIS — D6959 Other secondary thrombocytopenia: Secondary | ICD-10-CM | POA: Diagnosis present

## 2022-01-01 DIAGNOSIS — G9341 Metabolic encephalopathy: Secondary | ICD-10-CM | POA: Diagnosis present

## 2022-01-01 DIAGNOSIS — M479 Spondylosis, unspecified: Secondary | ICD-10-CM | POA: Diagnosis present

## 2022-01-01 DIAGNOSIS — Z66 Do not resuscitate: Secondary | ICD-10-CM | POA: Diagnosis present

## 2022-01-01 DIAGNOSIS — C240 Malignant neoplasm of extrahepatic bile duct: Secondary | ICD-10-CM | POA: Diagnosis not present

## 2022-01-01 DIAGNOSIS — I10 Essential (primary) hypertension: Secondary | ICD-10-CM | POA: Diagnosis present

## 2022-01-01 DIAGNOSIS — R6883 Chills (without fever): Secondary | ICD-10-CM | POA: Diagnosis not present

## 2022-01-01 DIAGNOSIS — Z833 Family history of diabetes mellitus: Secondary | ICD-10-CM

## 2022-01-01 DIAGNOSIS — Z87891 Personal history of nicotine dependence: Secondary | ICD-10-CM | POA: Diagnosis not present

## 2022-01-01 DIAGNOSIS — R109 Unspecified abdominal pain: Secondary | ICD-10-CM | POA: Diagnosis not present

## 2022-01-01 DIAGNOSIS — R509 Fever, unspecified: Secondary | ICD-10-CM | POA: Diagnosis not present

## 2022-01-01 DIAGNOSIS — I700 Atherosclerosis of aorta: Secondary | ICD-10-CM | POA: Diagnosis not present

## 2022-01-01 DIAGNOSIS — M1711 Unilateral primary osteoarthritis, right knee: Secondary | ICD-10-CM | POA: Diagnosis present

## 2022-01-01 DIAGNOSIS — I350 Nonrheumatic aortic (valve) stenosis: Secondary | ICD-10-CM | POA: Diagnosis present

## 2022-01-01 DIAGNOSIS — K822 Perforation of gallbladder: Secondary | ICD-10-CM | POA: Diagnosis not present

## 2022-01-01 DIAGNOSIS — K8309 Other cholangitis: Secondary | ICD-10-CM | POA: Diagnosis not present

## 2022-01-01 DIAGNOSIS — E440 Moderate protein-calorie malnutrition: Secondary | ICD-10-CM | POA: Diagnosis present

## 2022-01-01 DIAGNOSIS — Z20822 Contact with and (suspected) exposure to covid-19: Secondary | ICD-10-CM | POA: Diagnosis present

## 2022-01-01 DIAGNOSIS — R4182 Altered mental status, unspecified: Secondary | ICD-10-CM | POA: Diagnosis present

## 2022-01-01 DIAGNOSIS — G8929 Other chronic pain: Secondary | ICD-10-CM | POA: Diagnosis present

## 2022-01-01 DIAGNOSIS — Z510 Encounter for antineoplastic radiation therapy: Secondary | ICD-10-CM | POA: Diagnosis not present

## 2022-01-01 LAB — CBC WITH DIFFERENTIAL/PLATELET
Abs Immature Granulocytes: 0.03 10*3/uL (ref 0.00–0.07)
Abs Immature Granulocytes: 0.05 10*3/uL (ref 0.00–0.07)
Basophils Absolute: 0 10*3/uL (ref 0.0–0.1)
Basophils Absolute: 0 10*3/uL (ref 0.0–0.1)
Basophils Relative: 0 %
Basophils Relative: 0 %
Eosinophils Absolute: 0 10*3/uL (ref 0.0–0.5)
Eosinophils Absolute: 0 10*3/uL (ref 0.0–0.5)
Eosinophils Relative: 0 %
Eosinophils Relative: 0 %
HCT: 39.7 % (ref 39.0–52.0)
HCT: 34 % — ABNORMAL LOW (ref 39.0–52.0)
Hemoglobin: 13.2 g/dL (ref 13.0–17.0)
Hemoglobin: 11.5 g/dL — ABNORMAL LOW (ref 13.0–17.0)
Immature Granulocytes: 1 %
Immature Granulocytes: 1 %
Lymphocytes Relative: 4 %
Lymphocytes Relative: 1 %
Lymphs Abs: 0.2 10*3/uL — ABNORMAL LOW (ref 0.7–4.0)
Lymphs Abs: 0.1 10*3/uL — ABNORMAL LOW (ref 0.7–4.0)
MCH: 32.1 pg (ref 26.0–34.0)
MCH: 32.8 pg (ref 26.0–34.0)
MCHC: 33.2 g/dL (ref 30.0–36.0)
MCHC: 33.8 g/dL (ref 30.0–36.0)
MCV: 96.6 fL (ref 80.0–100.0)
MCV: 96.9 fL (ref 80.0–100.0)
Monocytes Absolute: 0.4 10*3/uL (ref 0.1–1.0)
Monocytes Absolute: 1.4 10*3/uL — ABNORMAL HIGH (ref 0.1–1.0)
Monocytes Relative: 6 %
Monocytes Relative: 14 %
Neutro Abs: 5.6 10*3/uL (ref 1.7–7.7)
Neutro Abs: 8.1 10*3/uL — ABNORMAL HIGH (ref 1.7–7.7)
Neutrophils Relative %: 89 %
Neutrophils Relative %: 84 %
Platelets: 170 10*3/uL (ref 150–400)
Platelets: 144 10*3/uL — ABNORMAL LOW (ref 150–400)
RBC: 4.11 MIL/uL — ABNORMAL LOW (ref 4.22–5.81)
RBC: 3.51 MIL/uL — ABNORMAL LOW (ref 4.22–5.81)
RDW: 13.7 % (ref 11.5–15.5)
RDW: 13.6 % (ref 11.5–15.5)
WBC: 6.2 10*3/uL (ref 4.0–10.5)
WBC: 9.7 10*3/uL (ref 4.0–10.5)
nRBC: 0 % (ref 0.0–0.2)
nRBC: 0 % (ref 0.0–0.2)

## 2022-01-01 LAB — URINALYSIS, ROUTINE W REFLEX MICROSCOPIC
Bacteria, UA: NONE SEEN
Bilirubin Urine: NEGATIVE
Glucose, UA: NEGATIVE mg/dL
Ketones, ur: 5 mg/dL — AB
Leukocytes,Ua: NEGATIVE
Nitrite: NEGATIVE
Protein, ur: NEGATIVE mg/dL
Specific Gravity, Urine: >1.046 — ABNORMAL HIGH (ref 1.005–1.030)
pH: 5 (ref 5.0–8.0)

## 2022-01-01 LAB — RAD ONC ARIA SESSION SUMMARY
Course Elapsed Days: 28
Plan Fractions Treated to Date: 20
Plan Prescribed Dose Per Fraction: 1.8 Gy
Plan Total Fractions Prescribed: 25
Plan Total Prescribed Dose: 45 Gy
Reference Point Dosage Given to Date: 36 Gy
Reference Point Session Dosage Given: 1.8 Gy
Session Number: 20

## 2022-01-01 LAB — COMPREHENSIVE METABOLIC PANEL
ALT: 11 U/L (ref 0–44)
ALT: 12 U/L (ref 0–44)
AST: 20 U/L (ref 15–41)
AST: 20 U/L (ref 15–41)
Albumin: 3.6 g/dL (ref 3.5–5.0)
Albumin: 2.8 g/dL — ABNORMAL LOW (ref 3.5–5.0)
Alkaline Phosphatase: 84 U/L (ref 38–126)
Alkaline Phosphatase: 74 U/L (ref 38–126)
Anion gap: 5 (ref 5–15)
Anion gap: 9 (ref 5–15)
BUN: 10 mg/dL (ref 8–23)
BUN: 10 mg/dL (ref 8–23)
CO2: 31 mmol/L (ref 22–32)
CO2: 26 mmol/L (ref 22–32)
Calcium: 9.2 mg/dL (ref 8.9–10.3)
Calcium: 8.1 mg/dL — ABNORMAL LOW (ref 8.9–10.3)
Chloride: 99 mmol/L (ref 98–111)
Chloride: 101 mmol/L (ref 98–111)
Creatinine, Ser: 0.87 mg/dL (ref 0.61–1.24)
Creatinine, Ser: 0.73 mg/dL (ref 0.61–1.24)
GFR, Estimated: >60 mL/min (ref >60)
GFR, Estimated: >60 mL/min (ref >60)
Glucose, Bld: 119 mg/dL — ABNORMAL HIGH (ref 70–99)
Glucose, Bld: 124 mg/dL — ABNORMAL HIGH (ref 70–99)
Potassium: 3.7 mmol/L (ref 3.5–5.1)
Potassium: 3.3 mmol/L — ABNORMAL LOW (ref 3.5–5.1)
Sodium: 135 mmol/L (ref 135–145)
Sodium: 136 mmol/L (ref 135–145)
Total Bilirubin: 0.9 mg/dL (ref 0.3–1.2)
Total Bilirubin: 1 mg/dL (ref 0.3–1.2)
Total Protein: 7.6 g/dL (ref 6.5–8.1)
Total Protein: 6.6 g/dL (ref 6.5–8.1)

## 2022-01-01 LAB — LACTIC ACID, PLASMA: Lactic Acid, Venous: 0.9 mmol/L (ref 0.5–1.9)

## 2022-01-01 LAB — PROTIME-INR
INR: 1.1 (ref 0.8–1.2)
Prothrombin Time: 14.3 seconds (ref 11.4–15.2)

## 2022-01-01 LAB — RESP PANEL BY RT-PCR (FLU A&B, COVID) ARPGX2
Influenza A by PCR: NEGATIVE
Influenza B by PCR: NEGATIVE
SARS Coronavirus 2 by RT PCR: NEGATIVE

## 2022-01-01 LAB — LIPASE, BLOOD: Lipase: 26 U/L (ref 11–51)

## 2022-01-01 MED ORDER — SODIUM CHLORIDE 0.9 % IV SOLN
2.0000 g | Freq: Once | INTRAVENOUS | Status: AC
  Administered 2022-01-01: 2 g via INTRAVENOUS
  Filled 2022-01-01: qty 20

## 2022-01-01 MED ORDER — METRONIDAZOLE 500 MG/100ML IV SOLN
500.0000 mg | Freq: Once | INTRAVENOUS | Status: AC
  Administered 2022-01-01: 500 mg via INTRAVENOUS
  Filled 2022-01-01: qty 100

## 2022-01-01 MED ORDER — ACETAMINOPHEN 500 MG PO TABS
1000.0000 mg | ORAL_TABLET | Freq: Once | ORAL | Status: AC
  Administered 2022-01-01: 1000 mg via ORAL
  Filled 2022-01-01: qty 2

## 2022-01-01 MED ORDER — IOHEXOL 300 MG/ML  SOLN
100.0000 mL | Freq: Once | INTRAMUSCULAR | Status: AC | PRN
  Administered 2022-01-01: 100 mL via INTRAVENOUS

## 2022-01-01 MED ORDER — METRONIDAZOLE 500 MG/100ML IV SOLN
500.0000 mg | Freq: Two times a day (BID) | INTRAVENOUS | Status: DC
  Administered 2022-01-02 – 2022-01-03 (×3): 500 mg via INTRAVENOUS
  Filled 2022-01-01 (×3): qty 100

## 2022-01-01 MED ORDER — SODIUM CHLORIDE 0.9 % IV BOLUS
1000.0000 mL | Freq: Once | INTRAVENOUS | Status: AC
  Administered 2022-01-01: 1000 mL via INTRAVENOUS

## 2022-01-01 MED ORDER — SODIUM CHLORIDE 0.9 % IV SOLN
2.0000 g | INTRAVENOUS | Status: DC
  Administered 2022-01-02: 2 g via INTRAVENOUS
  Filled 2022-01-01: qty 20

## 2022-01-01 MED ORDER — VANCOMYCIN HCL 1500 MG/300ML IV SOLN
1500.0000 mg | Freq: Once | INTRAVENOUS | Status: AC
  Administered 2022-01-01: 1500 mg via INTRAVENOUS
  Filled 2022-01-01: qty 300

## 2022-01-01 MED ORDER — CEFTRIAXONE SODIUM 1 G IJ SOLR: 1.0000 g | Freq: Once | INTRAMUSCULAR | Status: DC

## 2022-01-01 NOTE — Progress Notes (Signed)
[place]   [phone] Fax:(336) [phone]   Clinic Follow up Note   Patient Care Team: London Pepper, MD as PCP - General (Family Medicine) Stanford Breed Denice Bors, MD as PCP - Cardiology (Cardiology) Lelon Perla, MD as Consulting Physician (Cardiology)  Date of Service:  01/01/2022  CHIEF COMPLAINT: f/u of suspected cholangiocarcinoma  CURRENT THERAPY:  Concurrent chemoRT with Xeloda, on 12/04/21             -Xeloda held since 12/12/21  ASSESSMENT & PLAN:  Patrick Collier is a 81 y.o. male with   1. 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 CT AP 10/06/21 showed biliary obstruction, he was referred to ED. -baseline CA 19-9 on 10/06/21 elevated to 129. -s/p ERCP 10/11/21 inpatient showed a mass in common bile duct, cytology not diagnostic showing only suspicious atypical cells. S/p stent placement  -staging chest CT 11/03/21 was negative. -he met Dr. Zenia Resides on 11/08/21 and was offered surgery, but pt declined. -repeat ERCP on 11/21/21 for stent change, path and cytology were again non-diagnostic but suspicious. -he began radiation with concurrent Xeloda on 12/04/21. He is tolerating moderately well but has experienced nausea and weight loss. He also developed a rash to his hands and face. We've held Xeloda from 8/22.  -he had an instance of uncontrollable shaking this morning, prior to radiation. I will order UA and culture to rule out infection.  -labs reviewed, overall improved off Xeloda. Will continue to monitor.   2. Symptom Management: Nausea, Weight loss -his baseline weight was 187 lbs in 06/2021; he was down to 162 lbs on 12/12/21. Weight is stable over the last few weeks.     PLAN: -continue daily radiation -will obtain UA and culture, due to his shaking episode this morning  -Lab and f/u in 2 weeks   No problem-specific Assessment & Plan notes found for this  encounter.   SUMMARY OF ONCOLOGIC HISTORY: Oncology History  Cholangiocarcinoma ([place])  10/07/2021 Imaging   MR ABDOMEN MRCP W WO CONTAST  IMPRESSION: 1. Severe biliary ductal dilation of both the intra and extrahepatic bile ducts with abrupt cutoff seen at the distal common bile duct. Findings are concerning for stricture or obstructing tumor. No obstructing mass is seen, although motion artifact on contrast-enhanced imaging limits evaluation. Recommend correlation with ERCP. 2. Small cystic lesions of the pancreatic tail, largest measures 10 mm. Recommend follow up pre and post contrast MRI/MRCP or pancreatic protocol CT in 2 years. This recommendation follows ACR consensus guidelines: Management of Incidental Pancreatic Cysts: A White Paper of the ACR Incidental Findings Committee. J Am Coll Radiol 1950;93:267-124. 3. Trace perihepatic ascites and trace bilateral pleural effusions.   10/11/2021 Procedure   ERCP-Dr. Fuller Plan  Impression:  -I single localized malignant appearing severe biliary stricture was found in the lower third of the main bile duct. -The proximal biliary tree was dilated, secondary to stricture -Biliary sphincterotomy was performed -Cells for cytology obtained in the lower third of the main duct -One plastic stent was placed into the common bile duct.     10/11/2021 Pathology Results   CYTOLOGY - NON PAP  CASE: [id number]  PATIENT: [name]  Non-Gynecological Cytology Report   FINAL MICROSCOPIC DIAGNOSIS:  A. COMMON BILE DUCT, LESION, FINE NEEDLE ASPIRATION:  - No malignant cells seen  -Scant cellularity; benign/reactive ductal cells and ductal epithelium  with acute inflammatory cells   B. COMMON BILE DUCT, STRICTURE, BRUSHING:  - Atypical  cells suspicious for tumor present    10/26/2021 Initial Diagnosis   Cholangiocarcinoma ([place])    Procedure   Upper EUS-Dr. Paulita Fujita  Impression:  - There was no sign of significant pathology in the  ampulla. - A mass was found in the common bile duct. Fine needle aspiration performed. - There was no sign of significant pathology in the pancreatic head, genu of the pancreas and pancreatic body. - A few abnormal lymph nodes were visualized in the peripancreatic region and porta hepatis region. - There was dilation in the middle third of the main bile duct, in the upper third of the main bile duct and in the common hepatic duct which measured up to 15 mm. - Small perihepatic ascites. - Overall constellation findings most consistent mid common bile duct cholangiocarcinoma.   11/21/2021 Cancer Staging   Staging form: Distal Bile Duct, AJCC 8th Edition - Clinical stage from 11/21/2021: Stage Unknown (cTX, cN0, cM0) - Signed by Truitt Merle, MD on 12/12/2021 Total positive nodes: 0      INTERVAL HISTORY:  Patrick Collier is here for a follow up of suspected cholangiocarcinoma. He was last seen by me on 12/18/21. He presents to the clinic accompanied by his daughter. He experienced uncontrollable shaking for about 30 minutes this morning, just prior to radiation.    All other systems were reviewed with the patient and are negative.  MEDICAL HISTORY:  Past Medical History:  Diagnosis Date   Arthritis    Back pain    Heart murmur    History of kidney stones    ICH (intracerebral hemorrhage) ([place])     SURGICAL HISTORY: Past Surgical History:  Procedure Laterality Date   BACK SURGERY     BILIARY BRUSHING  10/11/2021   Procedure: BILIARY BRUSHING;  Surgeon: Ladene Artist, MD;  Location: [facility];  Service: Gastroenterology;;   BILIARY BRUSHING  11/21/2021   Procedure: BILIARY BRUSHING;  Surgeon: Clarene Essex, MD;  Location: [facility];  Service: Gastroenterology;;   BILIARY STENT PLACEMENT N/A 10/11/2021   Procedure: BILIARY STENT PLACEMENT;  Surgeon: Ladene Artist, MD;  Location: [facility];  Service: Gastroenterology;  Laterality: N/A;   BILIARY STENT PLACEMENT N/A 11/21/2021    Procedure: BILIARY STENT PLACEMENT;  Surgeon: Clarene Essex, MD;  Location: [facility];  Service: Gastroenterology;  Laterality: N/A;   BIOPSY  11/21/2021   Procedure: BIOPSY;  Surgeon: Clarene Essex, MD;  Location: [facility];  Service: Gastroenterology;;   ERCP N/A 10/11/2021   Procedure: ENDOSCOPIC RETROGRADE CHOLANGIOPANCREATOGRAPHY (ERCP);  Surgeon: Ladene Artist, MD;  Location: [facility];  Service: Gastroenterology;  Laterality: N/A;   ERCP N/A 11/21/2021   Procedure: ENDOSCOPIC RETROGRADE CHOLANGIOPANCREATOGRAPHY (ERCP);  Surgeon: Clarene Essex, MD;  Location: [facility];  Service: Gastroenterology;  Laterality: N/A;  with Spyglass   ESOPHAGOGASTRODUODENOSCOPY (EGD) WITH PROPOFOL N/A 10/11/2021   Procedure: ESOPHAGOGASTRODUODENOSCOPY (EGD) WITH PROPOFOL;  Surgeon: Arta Silence, MD;  Location: [facility];  Service: Gastroenterology;  Laterality: N/A;   EUS N/A 10/11/2021   Procedure: UPPER ENDOSCOPIC ULTRASOUND (EUS) LINEAR;  Surgeon: Arta Silence, MD;  Location: [facility];  Service: Gastroenterology;  Laterality: N/A;   FINE NEEDLE ASPIRATION N/A 10/11/2021   Procedure: FINE NEEDLE ASPIRATION (FNA) LINEAR;  Surgeon: Arta Silence, MD;  Location: [facility];  Service: Gastroenterology;  Laterality: N/A;   INGUINAL HERNIA REPAIR     KNEE ARTHROPLASTY Right 05/16/2017   Procedure: RIGHT TOTAL KNEE ARTHROPLASTY WITH COMPUTER NAVIGATION;  Surgeon: Rod Can, MD;  Location: [facility];  Service: Orthopedics;  Laterality: Right;  Needs RNFA   NECK SURGERY     SPHINCTEROTOMY  10/11/2021   Procedure: SPHINCTEROTOMY;  Surgeon: Ladene Artist, MD;  Location: [facility];  Service: Gastroenterology;;   [name]  11/21/2021   Procedure: [name];  Surgeon: Clarene Essex, MD;  Location: [facility];  Service: Gastroenterology;;   [name] CHOLANGIOSCOPY N/A 11/21/2021   Procedure: ZMOQHUTM CHOLANGIOSCOPY;  Surgeon: Clarene Essex, MD;  Location: [facility];  Service:  Gastroenterology;  Laterality: N/A;   STENT REMOVAL  11/21/2021   Procedure: STENT REMOVAL;  Surgeon: Clarene Essex, MD;  Location: [facility];  Service: Gastroenterology;;   TONSILLECTOMY      I have reviewed the social history and family history with the patient and they are unchanged from previous note.  ALLERGIES:  has No Known Allergies.  MEDICATIONS:  No current facility-administered medications for this visit.   No current outpatient medications on file.   Facility-Administered Medications Ordered in Other Visits  Medication Dose Route Frequency Provider Last Rate Last Admin   [START ON 01/02/2022] cefTRIAXone (ROCEPHIN) 2 g in sodium chloride 0.9 % 100 mL IVPB  2 g Intravenous Q24H Tu, Ching T, DO       [START ON 01/02/2022] metroNIDAZOLE (FLAGYL) IVPB 500 mg  500 mg Intravenous Q12H Tu, Ching T, DO       sodium chloride 0.9 % bolus 1,000 mL  1,000 mL Intravenous Once Cristie Hem, MD        PHYSICAL EXAMINATION: ECOG PERFORMANCE STATUS: 2 - Symptomatic, <50% confined to bed  Vitals:   01/01/22 1253  BP: 109/74  Pulse: 75  Resp: 18  Temp: 98.4 F (36.9 C)  SpO2: 95%   Wt Readings from Last 3 Encounters:  01/01/22 162 lb 11.2 oz (73.8 kg)  12/18/21 162 lb (73.5 kg)  12/12/21 162 lb 1.6 oz (73.5 kg)     GENERAL:alert, no distress and comfortable SKIN: skin color, texture, turgor are normal, no rashes or significant lesions EYES: normal, Conjunctiva are pink and non-injected, sclera clear  LUNGS: clear to auscultation and percussion with normal breathing effort HEART: regular rate & rhythm and no murmurs and no lower extremity edema ABDOMEN:abdomen soft, non-tender and normal bowel sounds Musculoskeletal:no cyanosis of digits and no clubbing  NEURO: alert & oriented x 3 with fluent speech, no focal motor/sensory deficits  LABORATORY DATA:  I have reviewed the data as listed    Latest Ref Rng & Units 01/01/2022    4:21 PM 01/01/2022   12:30 PM 12/18/2021    12:28 PM  CBC  WBC 4.0 - 10.5 K/uL 9.7  6.2  6.7   Hemoglobin 13.0 - 17.0 g/dL 11.5  13.2  12.0   Hematocrit 39.0 - 52.0 % 34.0  39.7  35.2   Platelets 150 - 400 K/uL 144  170  295         Latest Ref Rng & Units 01/01/2022    4:21 PM 01/01/2022   12:30 PM 12/18/2021   12:28 PM  CMP  Glucose 70 - 99 mg/dL 124  119  121   BUN 8 - 23 mg/dL _0 Creatinine 0.61 - 1.24 mg/dL 0.73  0.87  0.73   Sodium 135 - 145 mmol/L 136  135  136   Potassium 3.5 - 5.1 mmol/L 3.3  3.7  3.8   Chloride 98 - 111 mmol/L 101  99  100   CO2 22 - 32 mmol/L 26  31  33   Calcium 8.9 - 10.3 mg/dL 8.1  9.2  8.9   Total Protein 6.5 - 8.1 g/dL 6.6  7.6  6.6   Total Bilirubin 0.3 - 1.2 mg/dL 1.0  0.9  0.7   Alkaline Phos 38 - 126 U/L 74  84  86   AST 15 - 41 U/L _0 ALT 0 - 44 U/L _1 RADIOGRAPHIC STUDIES: I have personally reviewed the radiological images as listed and agreed with the findings in the report. CT Abdomen Pelvis W Contrast  Result Date: 01/01/2022 CLINICAL DATA:  Acute abdominal pain. Radiation therapy to bile ducts today. EXAM: CT ABDOMEN AND PELVIS WITH CONTRAST TECHNIQUE: Multidetector CT imaging of the abdomen and pelvis was performed using the standard protocol following bolus administration of intravenous contrast. RADIATION DOSE REDUCTION: This exam was performed according to the departmental dose-optimization program which includes automated exposure control, adjustment of the mA and/or kV according to patient size and/or use of iterative reconstruction technique. CONTRAST:  123m OMNIPAQUE IOHEXOL 300 MG/ML  SOLN COMPARISON:  MRI abdomen 10/07/2021. FINDINGS: Lower chest: There is atelectasis in the lung bases. Hepatobiliary: Common bile duct stent is in place. There is pneumobilia likely related to the stent. There is gallbladder wall thickening with mild surrounding inflammation. There are 2 enhancing fluid attenuation areas within the gallbladder fossa. One is  likely the gallbladder and other is likely abnormal fluid collection such as abscess or biloma. The larger collection is more posterior measuring 2.0 x 6.3 cm. In the smaller collection is more anterior measuring 3.7 x 1.4 cm. No new focal liver lesions are identified. Pancreas: 1 cm cystic lesion in the pancreatic tail appears unchanged. No pancreatic ductal dilatation or surrounding inflammation. Spleen: Normal in size without focal abnormality. Adrenals/Urinary Tract: Adrenal glands are unremarkable. Kidneys are normal, without renal calculi, focal lesion, or hydronephrosis. Bladder is unremarkable. Stomach/Bowel: Stomach is within normal limits. No evidence of bowel wall thickening, distention, or inflammatory changes. The appendix is not seen. There is sigmoid colon diverticulosis. Vascular/Lymphatic: Aortic atherosclerosis. No enlarged abdominal or pelvic lymph nodes. Reproductive: Prostate is unremarkable. Other: No ascites.  Small fat containing left inguinal hernia. Musculoskeletal: Severe degenerative changes affect the spine. There are moderate degenerative changes of both hips. IMPRESSION: 1. There are 2 enhancing fluid collections in the gallbladder fossa. One is felt to represent gallbladder and the other is likely abnormal fluid collection such as biloma or abscess. These collections demonstrate wall thickening with surrounding inflammation. 2. New common bile duct stent in place with pneumobilia. Electronically Signed   By: ARonney AstersM.D.   On: 01/01/2022 17:43   CT Head Wo Contrast  Result Date: 01/01/2022 CLINICAL DATA:  Mental status change EXAM: CT HEAD WITHOUT CONTRAST TECHNIQUE: Contiguous axial images were obtained from the base of the skull through the vertex without intravenous contrast. RADIATION DOSE REDUCTION: This exam was performed according to the departmental dose-optimization program which includes automated exposure control, adjustment of the mA and/or kV according to  patient size and/or use of iterative reconstruction technique. COMPARISON:  None Available. FINDINGS: Brain: No acute territorial infarction, hemorrhage or intracranial mass. Moderate atrophy. Mild chronic small vessel ischemic changes of the white matter. The ventricles are nonenlarged. Vascular: No hyperdense vessels.  Carotid vascular calcification. Skull: Normal. Negative for fracture or focal lesion. Sinuses/Orbits: No acute finding. Other: None IMPRESSION: 1. No CT evidence for acute intracranial abnormality. 2.  Atrophy and mild chronic small vessel ischemic changes of the white matter Electronically Signed   By: Donavan Foil M.D.   On: 01/01/2022 17:32   DG Chest 1 View  Result Date: 01/01/2022 CLINICAL DATA:  Cough. EXAM: CHEST  1 VIEW COMPARISON:  CT of the chest 11/03/2021 FINDINGS: The heart size and mediastinal contours are within normal limits. Both lungs are clear. The visualized skeletal structures are unremarkable. IMPRESSION: No active disease. Electronically Signed   By: Ronney Asters M.D.   On: 01/01/2022 17:27      Orders Placed This Encounter  Procedures   Urine Culture    Standing Status:   Future    Standing Expiration Date:   01/02/2023   Urinalysis, Complete w Microscopic    Standing Status:   Future    Standing Expiration Date:   01/02/2023   All questions were answered. The patient knows to call the clinic with any problems, questions or concerns. No barriers to learning was detected. The total time spent in the appointment was 25 minutes.     Truitt Merle, MD 01/01/2022   I, Wilburn Mylar, am acting as scribe for Truitt Merle, MD.   I have reviewed the above documentation for accuracy and completeness, and I agree with the above.

## 2022-01-01 NOTE — Assessment & Plan Note (Signed)
Presented with fever and findings of gallbladder thickening and the inflammation along with intra-abdominal abscess -Received IV vancomycin, Rocephin and Flagyl in the ED.  We will continue with Rocephin and Flagyl -General surgery Dr. Harlow Asa was consulted and he will discuss with hepatobiliary surgeon and see in consultation tomorrow.  Might need consultation IR for drainage.

## 2022-01-01 NOTE — Progress Notes (Signed)
      Call received from Dr. Garnette Gunner in the [facility] ER.  Patient being admitted to medical service and surgical consultation requested.  I reviewed medical record, notes from Dr. Burr Medico, ER nursing notes, labs, and the CT scan from today.  Patient with apparent cholangiocarcinoma.  Declined surgery at consultation with Dr. Michaelle Birks.  Receiving chemotherapy and radiation therapy currently.  CT scan with fluid collection adjacent to gallbladder.  Stent in place.  WBC normal.  LFT's normal.  Febrile earlier. Hemodynamically stable.  Will see in consultation and discuss with our hepatobiliary surgeons.  May need IR consultation for possible drainage procedure.  Armandina Gemma, MD [facility] [place] practice Office: [phone]

## 2022-01-01 NOTE — Telephone Encounter (Signed)
This nurse received call from this patients wife stating that they just left from seeing the provider at this clinic and now are home and are unable to get the patient out of the car.  He is not able to move his arms and legs, he is moving his mouth but no sound is coming our and he is confused about where he is and what is going on.  EMS was present and was loading patient to bring to [facility] ED.  This nurse advised that MD will be made aware.  No further questions or concerns noted.

## 2022-01-01 NOTE — ED Triage Notes (Signed)
Pt arrived by EMS from home. Pt had radiation therapy to bile duct today and once pt got back home, pt became confused/ AMS. A&Ox2 Name and DOB. Incontinence of urine.  VS per EMS: CBG 140 101.8 BP 124/68 HR 72 SpO2 97% End tidal 34 RR 20

## 2022-01-01 NOTE — Progress Notes (Signed)
A consult was received from an ED physician for vancomycin per pharmacy dosing.  The patient's profile has been reviewed for ht/wt/allergies/indication/available labs.    A one time order has been placed for vancomycin 1500 mg IV x 1 dose.    Further antibiotics/pharmacy consults should be ordered by admitting physician if indicated.                       Thank you, Eudelia Bunch, Pharm.D 01/01/2022 4:01 PM

## 2022-01-01 NOTE — ED Provider Notes (Signed)
[facility] DEPT Provider Note  CSN: [id number] Arrival date & time: 01/01/22 1411  Chief Complaint(s) No chief complaint on file.  HPI Patrick Collier is a 81 y.o. male with history of heart murmur, biliary cancer presenting to the emergency department with confusion.  Earlier today, on the way to radiation treatment, patient noted to have full body shaking and chills.  He also seems slightly confused and with generalized weakness.  No noted fevers.  He went to radiation therapy, saw his oncologist, at which point the chills had resolved.  He went home, and then developed recurrence of generalized weakness and shaking, was unable to get out of the car so EMS was called.  On EMS arrival, patient had fever.  Family report that the patient is persistently somewhat confused.  They report that he has had increased sputum recently although no clear cough.  Otherwise no abdominal pain, dysuria, flank pain, chest pain, headaches, nausea or vomiting.   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 Authorizing Provider  capecitabine (XELODA) 500 MG tablet Take 3 tablets (1,500 mg  total) by mouth 2 (two) times daily. Take every 12 hours within 30 minutes after meals. Take only on days of radiation, Monday through Friday. 11/28/21   Truitt Merle, MD  lactulose (CHRONULAC) 10 GM/15ML solution Take 30 mLs (20 g total) by mouth 2 (two) times daily as needed for mild constipation or moderate constipation. Patient taking differently: Take 20 g by mouth 2 (two) times daily. 10/12/21   Mercy Riding, MD  ondansetron (ZOFRAN) 8 MG tablet Take 1 tablet (8 mg total) by mouth every 8 (eight) hours as needed for nausea or vomiting. 12/12/21   Truitt Merle, MD                                                                                                                                    Past Surgical History Past Surgical History:  Procedure Laterality Date   BACK SURGERY     BILIARY BRUSHING  10/11/2021   Procedure: BILIARY BRUSHING;  Surgeon: Ladene Artist, MD;  Location: [facility];  Service: Gastroenterology;;   BILIARY BRUSHING  11/21/2021   Procedure: BILIARY BRUSHING;  Surgeon: Clarene Essex, MD;  Location: [facility];  Service: Gastroenterology;;   BILIARY STENT PLACEMENT N/A 10/11/2021   Procedure: BILIARY STENT PLACEMENT;  Surgeon: Ladene Artist, MD;  Location: [facility];  Service: Gastroenterology;  Laterality: N/A;   BILIARY STENT PLACEMENT N/A 11/21/2021   Procedure: BILIARY STENT PLACEMENT;  Surgeon: Clarene Essex, MD;  Location: [facility];  Service: Gastroenterology;  Laterality: N/A;   BIOPSY  11/21/2021   Procedure: BIOPSY;  Surgeon: Clarene Essex, MD;  Location: [facility];  Service: Gastroenterology;;   ERCP N/A 10/11/2021   Procedure: ENDOSCOPIC RETROGRADE CHOLANGIOPANCREATOGRAPHY (ERCP);  Surgeon: Ladene Artist, MD;  Location: [facility];  Service: Gastroenterology;  Laterality: N/A;   ERCP N/A 11/21/2021   Procedure: ENDOSCOPIC RETROGRADE CHOLANGIOPANCREATOGRAPHY (ERCP);  Surgeon: Clarene Essex, MD;  Location: [facility];  Service: Gastroenterology;  Laterality:  N/A;  with Spyglass   ESOPHAGOGASTRODUODENOSCOPY (EGD) WITH PROPOFOL N/A 10/11/2021   Procedure: ESOPHAGOGASTRODUODENOSCOPY (EGD) WITH PROPOFOL;  Surgeon: Arta Silence, MD;  Location: [facility];  Service: Gastroenterology;  Laterality: N/A;   EUS N/A 10/11/2021   Procedure: UPPER ENDOSCOPIC ULTRASOUND (EUS) LINEAR;  Surgeon: Arta Silence, MD;  Location: [facility];  Service: Gastroenterology;  Laterality: N/A;   FINE NEEDLE ASPIRATION N/A 10/11/2021   Procedure: FINE NEEDLE ASPIRATION (FNA) LINEAR;  Surgeon: Arta Silence, MD;  Location: [facility];  Service: Gastroenterology;  Laterality: N/A;   INGUINAL HERNIA REPAIR     KNEE ARTHROPLASTY Right 05/16/2017   Procedure: RIGHT TOTAL KNEE ARTHROPLASTY WITH COMPUTER NAVIGATION;  Surgeon: Rod Can, MD;  Location: [facility];  Service: Orthopedics;  Laterality: Right;  Needs RNFA   NECK SURGERY     SPHINCTEROTOMY  10/11/2021   Procedure: SPHINCTEROTOMY;  Surgeon: Ladene Artist, MD;  Location: [facility];  Service: Gastroenterology;;   [name]  11/21/2021   Procedure: [name];  Surgeon: Clarene Essex, MD;  Location: [facility];  Service: Gastroenterology;;   [name] CHOLANGIOSCOPY N/A 11/21/2021   Procedure: [id number] CHOLANGIOSCOPY;  Surgeon: Clarene Essex, MD;  Location: [facility];  Service: Gastroenterology;  Laterality: N/A;   STENT REMOVAL  11/21/2021   Procedure: STENT REMOVAL;  Surgeon: Clarene Essex, MD;  Location: [facility];  Service: Gastroenterology;;   TONSILLECTOMY     Family History Family History  Problem Relation Age of Onset   Diabetes Mother     Social History Social History   Tobacco Use   Smoking status: Former   Smokeless tobacco: Never  Scientific laboratory technician Use: Never used  Substance Use Topics   Alcohol use: Yes    Comment: one beer per day   Drug use: No   Allergies Patient has no known allergies.  Review of Systems Review of Systems  All other systems reviewed and are  negative.   Physical Exam Vital Signs  I have reviewed the triage vital signs BP (!) 95/59   Pulse 66   Temp 98.1 F (36.7 C)   Resp 16   SpO2 94%  Physical Exam Vitals and nursing note reviewed.  Constitutional:      General: He is not in acute distress.    Appearance: Normal appearance.  HENT:     Mouth/Throat:     Mouth: Mucous membranes are moist.  Eyes:     Conjunctiva/sclera: Conjunctivae normal.  Cardiovascular:     Rate and Rhythm: Normal rate and regular rhythm.  Pulmonary:  Effort: Pulmonary effort is normal. No respiratory distress.     Breath sounds: Normal breath sounds.  Abdominal:     General: Abdomen is flat.     Palpations: Abdomen is soft.     Tenderness: There is no abdominal tenderness.  Musculoskeletal:     Right lower leg: No edema.     Left lower leg: No edema.  Skin:    General: Skin is warm and dry.     Capillary Refill: Capillary refill takes less than 2 seconds.  Neurological:     Mental Status: He is alert. Mental status is at baseline.     Comments: Fluent speech, strength equal bilaterally, no facial droop or cranial nerve deficit, but oriented only that he is in the hospital, not to name or year or specific hospital.  No meningismus  Psychiatric:        Mood and Affect: Mood normal.        Behavior: Behavior normal.     ED Results and Treatments Labs (all labs ordered are listed, but only abnormal results are displayed) Labs Reviewed  COMPREHENSIVE METABOLIC PANEL - Abnormal; Notable for the following components:      Result Value   Potassium 3.3 (*)    Glucose, Bld 124 (*)    Calcium 8.1 (*)    Albumin 2.8 (*)    All other components within normal limits  CBC WITH DIFFERENTIAL/PLATELET - Abnormal; Notable for the following components:   RBC 3.51 (*)    Hemoglobin 11.5 (*)    HCT 34.0 (*)    Platelets 144 (*)    Neutro Abs 8.1 (*)    Lymphs Abs 0.1 (*)    Monocytes Absolute 1.4 (*)    All other components within normal  limits  RESP PANEL BY RT-PCR (FLU A&B, COVID) ARPGX2  CULTURE, BLOOD (ROUTINE X 2)  CULTURE, BLOOD (ROUTINE X 2)  LIPASE, BLOOD  LACTIC ACID, PLASMA  PROTIME-INR                                                                                                                          Radiology CT Abdomen Pelvis W Contrast  Result Date: 01/01/2022 CLINICAL DATA:  Acute abdominal pain. Radiation therapy to bile ducts today. EXAM: CT ABDOMEN AND PELVIS WITH CONTRAST TECHNIQUE: Multidetector CT imaging of the abdomen and pelvis was performed using the standard protocol following bolus administration of intravenous contrast. RADIATION DOSE REDUCTION: This exam was performed according to the departmental dose-optimization program which includes automated exposure control, adjustment of the mA and/or kV according to patient size and/or use of iterative reconstruction technique. CONTRAST:  163m OMNIPAQUE IOHEXOL 300 MG/ML  SOLN COMPARISON:  MRI abdomen 10/07/2021. FINDINGS: Lower chest: There is atelectasis in the lung bases. Hepatobiliary: Common bile duct stent is in place. There is pneumobilia likely related to the stent. There is gallbladder wall thickening with mild surrounding inflammation. There are 2 enhancing fluid attenuation areas within the gallbladder fossa. One  is likely the gallbladder and other is likely abnormal fluid collection such as abscess or biloma. The larger collection is more posterior measuring 2.0 x 6.3 cm. In the smaller collection is more anterior measuring 3.7 x 1.4 cm. No new focal liver lesions are identified. Pancreas: 1 cm cystic lesion in the pancreatic tail appears unchanged. No pancreatic ductal dilatation or surrounding inflammation. Spleen: Normal in size without focal abnormality. Adrenals/Urinary Tract: Adrenal glands are unremarkable. Kidneys are normal, without renal calculi, focal lesion, or hydronephrosis. Bladder is unremarkable. Stomach/Bowel: Stomach is within  normal limits. No evidence of bowel wall thickening, distention, or inflammatory changes. The appendix is not seen. There is sigmoid colon diverticulosis. Vascular/Lymphatic: Aortic atherosclerosis. No enlarged abdominal or pelvic lymph nodes. Reproductive: Prostate is unremarkable. Other: No ascites.  Small fat containing left inguinal hernia. Musculoskeletal: Severe degenerative changes affect the spine. There are moderate degenerative changes of both hips. IMPRESSION: 1. There are 2 enhancing fluid collections in the gallbladder fossa. One is felt to represent gallbladder and the other is likely abnormal fluid collection such as biloma or abscess. These collections demonstrate wall thickening with surrounding inflammation. 2. New common bile duct stent in place with pneumobilia. Electronically Signed   By: Ronney Asters M.D.   On: 01/01/2022 17:43   CT Head Wo Contrast  Result Date: 01/01/2022 CLINICAL DATA:  Mental status change EXAM: CT HEAD WITHOUT CONTRAST TECHNIQUE: Contiguous axial images were obtained from the base of the skull through the vertex without intravenous contrast. RADIATION DOSE REDUCTION: This exam was performed according to the departmental dose-optimization program which includes automated exposure control, adjustment of the mA and/or kV according to patient size and/or use of iterative reconstruction technique. COMPARISON:  None Available. FINDINGS: Brain: No acute territorial infarction, hemorrhage or intracranial mass. Moderate atrophy. Mild chronic small vessel ischemic changes of the white matter. The ventricles are nonenlarged. Vascular: No hyperdense vessels.  Carotid vascular calcification. Skull: Normal. Negative for fracture or focal lesion. Sinuses/Orbits: No acute finding. Other: None IMPRESSION: 1. No CT evidence for acute intracranial abnormality. 2. Atrophy and mild chronic small vessel ischemic changes of the white matter Electronically Signed   By: Donavan Foil M.D.    On: 01/01/2022 17:32   DG Chest 1 View  Result Date: 01/01/2022 CLINICAL DATA:  Cough. EXAM: CHEST  1 VIEW COMPARISON:  CT of the chest 11/03/2021 FINDINGS: The heart size and mediastinal contours are within normal limits. Both lungs are clear. The visualized skeletal structures are unremarkable. IMPRESSION: No active disease. Electronically Signed   By: Ronney Asters M.D.   On: 01/01/2022 17:27    Pertinent labs & imaging results that were available during my care of the patient were reviewed by me and considered in my medical decision making (see MDM for details).  Medications Ordered in ED Medications  vancomycin (VANCOREADY) IVPB 1500 mg/300 mL (1,500 mg Intravenous New Bag/Given 01/01/22 1815)  metroNIDAZOLE (FLAGYL) IVPB 500 mg (has no administration in time range)  sodium chloride 0.9 % bolus 1,000 mL (has no administration in time range)  acetaminophen (TYLENOL) tablet 1,000 mg (1,000 mg Oral Given 01/01/22 1639)  cefTRIAXone (ROCEPHIN) 2 g in sodium chloride 0.9 % 100 mL IVPB (0 g Intravenous Stopped 01/01/22 1812)  iohexol (OMNIPAQUE) 300 MG/ML solution 100 mL (100 mLs Intravenous Contrast Given 01/01/22 1712)  Procedures Procedures  (including critical care time)  Medical Decision Making / ED Course   MDM:  81 year old male presenting to the emergency department with altered mental status.  Per history, sounds suspicious for sepsis, unclear source without focal symptoms.  Will obtain septic work-up including chest x-ray, urinalysis.  Given history of malignancy will also obtain CT abdomen.  Labs reviewed from earlier today and overall unremarkable.  Given fever, will likely need admission for IV antibiotics and blood culture monitoring.  Clinical Course as of 01/01/22 1956  Mon Jan 01, 2022  1955 Dr. Flossie Buffy has accepted patient for admission.  Discussed with Dr. Harlow Asa who will consult. Imaging shows possible abscess adjacent to gallbladder which is likely source of infection. UA pending. CXR clear.   [WS]    Clinical Course User Index [WS] Cristie Hem, MD     Additional history obtained: -Additional history obtained from family -External records from outside source obtained and reviewed including: Chart review including previous notes, labs, imaging, consultation notes   Lab Tests: -I ordered, reviewed, and interpreted labs.   The pertinent results include:   Labs Reviewed  COMPREHENSIVE METABOLIC PANEL - Abnormal; Notable for the following components:      Result Value   Potassium 3.3 (*)    Glucose, Bld 124 (*)    Calcium 8.1 (*)    Albumin 2.8 (*)    All other components within normal limits  CBC WITH DIFFERENTIAL/PLATELET - Abnormal; Notable for the following components:   RBC 3.51 (*)    Hemoglobin 11.5 (*)    HCT 34.0 (*)    Platelets 144 (*)    Neutro Abs 8.1 (*)    Lymphs Abs 0.1 (*)    Monocytes Absolute 1.4 (*)    All other components within normal limits  RESP PANEL BY RT-PCR (FLU A&B, COVID) ARPGX2  CULTURE, BLOOD (ROUTINE X 2)  CULTURE, BLOOD (ROUTINE X 2)  LIPASE, BLOOD  LACTIC ACID, PLASMA  PROTIME-INR       Imaging Studies ordered: I ordered imaging studies including CT head, CXR, CT A/P On my interpretation imaging demonstrates possible abscess adjacent to galbladder I independently visualized and interpreted imaging. I agree with the radiologist interpretation   Medicines ordered and prescription drug management: Meds ordered this encounter  Medications   DISCONTD: cefTRIAXone (ROCEPHIN) 1 g in sodium chloride 0.9 % 100 mL IVPB    Order Specific Question:   Antibiotic Indication:    Answer:   Bacteremia   acetaminophen (TYLENOL) tablet 1,000 mg   cefTRIAXone (ROCEPHIN) 2 g in sodium chloride 0.9 % 100 mL IVPB    Order Specific Question:   Antibiotic Indication:    Answer:    Bacteremia   vancomycin (VANCOREADY) IVPB 1500 mg/300 mL    Order Specific Question:   Indication:    Answer:   Sepsis   iohexol (OMNIPAQUE) 300 MG/ML solution 100 mL   metroNIDAZOLE (FLAGYL) IVPB 500 mg    Order Specific Question:   Antibiotic Indication:    Answer:   Intra-abdominal Infection   sodium chloride 0.9 % bolus 1,000 mL    -I have reviewed the patients home medicines and have made adjustments as needed   Consultations Obtained: I requested consultation with the general surgeon,  and discussed lab and imaging findings as well as pertinent plan - they recommend: they will consult   Cardiac Monitoring: The patient was maintained on a cardiac monitor.  I personally viewed and interpreted the cardiac monitored which showed  an underlying rhythm of: NSR  Social Determinants of Health:  Factors impacting patients care include: former smoker   Reevaluation: After the interventions noted above, I reevaluated the patient and found that they have improved  Co morbidities that complicate the patient evaluation  Past Medical History:  Diagnosis Date   Arthritis    Back pain    Heart murmur    History of kidney stones    ICH (intracerebral hemorrhage) ([place])       Dispostion: Admit    Final Clinical Impression(s) / ED Diagnoses Final diagnoses:  Sepsis without acute organ dysfunction, due to unspecified organism [facility])  Abscess of gallbladder     This chart was dictated using voice recognition software.  Despite best efforts to proofread,  errors can occur which can change the documentation meaning.    Cristie Hem, MD 01/01/22 [phone]

## 2022-01-01 NOTE — H&P (Signed)
History and Physical    Patient: Patrick Collier JOI:[id number] DOB: [birth year] DOA: 01/01/2022 DOS: the patient was seen and examined on 01/01/2022 PCP: London Pepper, MD  Patient coming from: Home  Chief Complaint: No chief complaint on file.  HPI: Patrick Collier is a 81 y.o. 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  Pt unable to recall events prior to ED evaluation. Per report pt had received radiation and then was unable to move his extremities to get out of the car at home. Wife reports he was confused. Currently he is alert and oriented x4 and answers questions appropriately. Denies any pain. No nausea, vomiting or diarrhea. No cough or shortness of breath.   In the ED, he was febrile to 101.75F, normotensive on room air.  No leukocytosis, hemoglobin of 11.5.  Platelet of 144. Sodium of 136, K of 3.3, normal creatinine of 0.73.  LFTs and total bilirubin within normal limits. Negative flu and COVID PCR.  CT head is negative for any acute intracranial abnormalities. CT abdomen pelvis showing gallbladder wall thickening with mild surrounding inflammation.  Also possible abscess versus bilioma fluid collection.  He was started on IV vancomycin, Rocephin and Flagyl in the ED.  Hospitalist consult for further management was requested.  General surgery was also then requested to be consulted.  Review of Systems: As mentioned in the history of present illness. All other systems reviewed and are negative. Past Medical History:  Diagnosis Date   Arthritis    Back pain    Heart murmur    History of kidney stones    ICH (intracerebral hemorrhage) ([place])    Past Surgical History:  Procedure Laterality Date   BACK SURGERY     BILIARY BRUSHING  10/11/2021   Procedure: BILIARY BRUSHING;  Surgeon: Ladene Artist, MD;  Location: [facility];  Service:  Gastroenterology;;   BILIARY BRUSHING  11/21/2021   Procedure: BILIARY BRUSHING;  Surgeon: Clarene Essex, MD;  Location: [facility];  Service: Gastroenterology;;   BILIARY STENT PLACEMENT N/A 10/11/2021   Procedure: BILIARY STENT PLACEMENT;  Surgeon: Ladene Artist, MD;  Location: [facility];  Service: Gastroenterology;  Laterality: N/A;   BILIARY STENT PLACEMENT N/A 11/21/2021   Procedure: BILIARY STENT PLACEMENT;  Surgeon: Clarene Essex, MD;  Location: [facility];  Service: Gastroenterology;  Laterality: N/A;   BIOPSY  11/21/2021   Procedure: BIOPSY;  Surgeon: Clarene Essex, MD;  Location: [facility];  Service: Gastroenterology;;   ERCP N/A 10/11/2021   Procedure: ENDOSCOPIC RETROGRADE CHOLANGIOPANCREATOGRAPHY (ERCP);  Surgeon: Ladene Artist, MD;  Location: [facility];  Service: Gastroenterology;  Laterality: N/A;   ERCP N/A 11/21/2021   Procedure: ENDOSCOPIC RETROGRADE CHOLANGIOPANCREATOGRAPHY (ERCP);  Surgeon: Clarene Essex, MD;  Location: [facility];  Service: Gastroenterology;  Laterality: N/A;  with Spyglass   ESOPHAGOGASTRODUODENOSCOPY (EGD) WITH PROPOFOL N/A 10/11/2021   Procedure: ESOPHAGOGASTRODUODENOSCOPY (EGD) WITH PROPOFOL;  Surgeon: Arta Silence, MD;  Location: [facility];  Service: Gastroenterology;  Laterality: N/A;   EUS N/A 10/11/2021   Procedure: UPPER ENDOSCOPIC ULTRASOUND (EUS) LINEAR;  Surgeon: Arta Silence, MD;  Location: [facility];  Service: Gastroenterology;  Laterality: N/A;   FINE NEEDLE ASPIRATION N/A 10/11/2021   Procedure: FINE NEEDLE ASPIRATION (FNA) LINEAR;  Surgeon: Arta Silence, MD;  Location: [facility];  Service: Gastroenterology;  Laterality: N/A;   INGUINAL HERNIA REPAIR     KNEE ARTHROPLASTY Right 05/16/2017   Procedure: RIGHT TOTAL KNEE ARTHROPLASTY  WITH COMPUTER NAVIGATION;  Surgeon: Rod Can, MD;  Location: [facility];  Service: Orthopedics;  Laterality: Right;  Needs RNFA   NECK SURGERY     SPHINCTEROTOMY  10/11/2021   Procedure:  SPHINCTEROTOMY;  Surgeon: Ladene Artist, MD;  Location: [facility];  Service: Gastroenterology;;   [name]  11/21/2021   Procedure: [name];  Surgeon: Clarene Essex, MD;  Location: [facility];  Service: Gastroenterology;;   [name] CHOLANGIOSCOPY N/A 11/21/2021   Procedure: XFGHWEXH CHOLANGIOSCOPY;  Surgeon: Clarene Essex, MD;  Location: [facility];  Service: Gastroenterology;  Laterality: N/A;   STENT REMOVAL  11/21/2021   Procedure: STENT REMOVAL;  Surgeon: Clarene Essex, MD;  Location: [facility];  Service: Gastroenterology;;   TONSILLECTOMY     Social History:  reports that he has quit smoking. He has never used smokeless tobacco. He reports current alcohol use. He reports that he does not use drugs.  No Known Allergies  Family History  Problem Relation Age of Onset   Diabetes Mother     Prior to Admission medications   Medication Sig Start Date End Date Taking? Authorizing Provider  capecitabine (XELODA) 500 MG tablet Take 3 tablets (1,500 mg total) by mouth 2 (two) times daily. Take every 12 hours within 30 minutes after meals. Take only on days of radiation, Monday through Friday. 11/28/21   Truitt Merle, MD  lactulose (CHRONULAC) 10 GM/15ML solution Take 30 mLs (20 g total) by mouth 2 (two) times daily as needed for mild constipation or moderate constipation. Patient taking differently: Take 20 g by mouth 2 (two) times daily. 10/12/21   Mercy Riding, MD  ondansetron (ZOFRAN) 8 MG tablet Take 1 tablet (8 mg total) by mouth every 8 (eight) hours as needed for nausea or vomiting. 12/12/21   Truitt Merle, MD    Physical Exam: Vitals:   01/01/22 1815 01/01/22 1900 01/01/22 2008 01/01/22 2011  BP: 104/61 (!) 95/59 103/67   Pulse: 66  (!) 54   Resp: '17 16 19   '$ Temp: 98.1 F (36.7 C)  98.5 F (36.9 C)   TempSrc:   Oral   SpO2: 94% 94% 96%   Weight:    73.8 kg  Height:    '5\' 6"'$  (1.676 m)   Constitutional: NAD, calm, comfortable, elderly gentleman nontoxic-appearing laying  flat in bed Eyes: lids and conjunctivae normal ENMT: Mucous membranes are moist.  Neck: normal, supple Respiratory: clear to auscultation bilaterally, no wheezing, no crackles. Normal respiratory effort. No accessory muscle use.  Cardiovascular: Regular rate and rhythm, systolic flow murmur but no gallops or rubs. no extremity edema. Abdomen: Soft, nontender nondistended.  No hepatomegaly.. Bowel sounds positive.  Musculoskeletal: no clubbing / cyanosis. No joint deformity upper and lower extremities. Good ROM, no contractures. Normal muscle tone.  Skin: no rashes, lesions, ulcers. Neurologic: CN 2-12 grossly intact.  Strength 5/5 in all 4.  Psychiatric: Normal judgment and insight. Alert and oriented x 3. Normal mood. Data Reviewed:  See HPI  Assessment and Plan: * Acute metabolic encephalopathy Likely secondary to active infection in the setting of malignancy on radiation.  Continue treatment as below. -current alert and oriented x4 but not about to recall events from earlier today  Thrombocytopenia (HCC) Platelet of 144.  Likely reactive to ongoing infection.  Acute cholecystitis Presented with fever and findings of gallbladder thickening and the inflammation along with intra-abdominal abscess -Received IV vancomycin, Rocephin and Flagyl in the ED.  We will continue with Rocephin and Flagyl -General surgery Dr. Harlow Asa  was consulted and he will discuss with hepatobiliary surgeon and see in consultation tomorrow.  Might need consultation IR for drainage.  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 -S/p stent placement in common bile duct following ERCP showing CBD mass on 10/11/2021.  Had repeat ERCP on 11/21/2021 for stent change.  Pathology and cytology again were nondiagnostic but suspicious. -He was offered surgery on 11/08/2021 but declined -Currently on daily radiation starting 12/04/2021.  Also previously on Xeloda but this has  been held since 8/22 due to developing rash. -Follows with oncology Dr. Annamaria Boots      Advance Care Planning: DNR  Consults: General surgery  Family Communication: none at bedside  Severity of Illness: The appropriate patient status for this patient is INPATIENT. Inpatient status is judged to be reasonable and necessary in order to provide the required intensity of service to ensure the patient's safety. The patient's presenting symptoms, physical exam findings, and initial radiographic and laboratory data in the context of their chronic comorbidities is felt to place them at high risk for further clinical deterioration. Furthermore, it is not anticipated that the patient will be medically stable for discharge from the hospital within 2 midnights of admission.   * I certify that at the point of admission it is my clinical judgment that the patient will require inpatient hospital care spanning beyond 2 midnights from the point of admission due to high intensity of service, high risk for further deterioration and high frequency of surveillance required.*  Author: Orene Desanctis, DO 01/01/2022 9:36 PM  For on call review [email or web address].

## 2022-01-01 NOTE — ED Notes (Signed)
Received patient in no acute distress breathing spontaneously to room air . Patient receiving vancomycin . No sign if IV complications noted . Continuing care. Patient placed on cardiac monitor.

## 2022-01-01 NOTE — Assessment & Plan Note (Addendum)
Likely secondary to active infection in the setting of malignancy on radiation.  Continue treatment as below. -current alert and oriented x4 but not about to recall events from earlier today

## 2022-01-01 NOTE — Assessment & Plan Note (Addendum)
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 -S/p stent placement in common bile duct following ERCP showing CBD mass on 10/11/2021.  Had repeat ERCP on 11/21/2021 for stent change.  Pathology and cytology again were nondiagnostic but suspicious. -He was offered surgery on 11/08/2021 but declined -Currently on daily radiation starting 12/04/2021.  Also previously on Xeloda but this has been held since 8/22 due to developing rash. -Follows with oncology Dr. Annamaria Boots

## 2022-01-01 NOTE — Assessment & Plan Note (Signed)
Platelet of 144.  Likely reactive to ongoing infection.

## 2022-01-02 ENCOUNTER — Inpatient Hospital Stay (HOSPITAL_COMMUNITY): Payer: Medicare Other

## 2022-01-02 ENCOUNTER — Ambulatory Visit: Payer: Medicare Other

## 2022-01-02 ENCOUNTER — Other Ambulatory Visit (HOSPITAL_COMMUNITY): Payer: Self-pay

## 2022-01-02 DIAGNOSIS — K810 Acute cholecystitis: Secondary | ICD-10-CM | POA: Diagnosis not present

## 2022-01-02 DIAGNOSIS — R7881 Bacteremia: Secondary | ICD-10-CM | POA: Insufficient documentation

## 2022-01-02 HISTORY — PX: IR PERC CHOLECYSTOSTOMY: IMG2326

## 2022-01-02 LAB — BASIC METABOLIC PANEL
Anion gap: 5 (ref 5–15)
BUN: 10 mg/dL (ref 8–23)
CO2: 28 mmol/L (ref 22–32)
Calcium: 8.3 mg/dL — ABNORMAL LOW (ref 8.9–10.3)
Chloride: 102 mmol/L (ref 98–111)
Creatinine, Ser: 0.75 mg/dL (ref 0.61–1.24)
GFR, Estimated: >60 mL/min (ref >60)
Glucose, Bld: 104 mg/dL — ABNORMAL HIGH (ref 70–99)
Potassium: 4.3 mmol/L (ref 3.5–5.1)
Sodium: 135 mmol/L (ref 135–145)

## 2022-01-02 LAB — BLOOD CULTURE ID PANEL (REFLEXED) - BCID2

## 2022-01-02 LAB — CBC
HCT: 35.5 % — ABNORMAL LOW (ref 39.0–52.0)
Hemoglobin: 11.6 g/dL — ABNORMAL LOW (ref 13.0–17.0)
MCH: 32.2 pg (ref 26.0–34.0)
MCHC: 32.7 g/dL (ref 30.0–36.0)
MCV: 98.6 fL (ref 80.0–100.0)
Platelets: 133 10*3/uL — ABNORMAL LOW (ref 150–400)
RBC: 3.6 MIL/uL — ABNORMAL LOW (ref 4.22–5.81)
RDW: 13.5 % (ref 11.5–15.5)
WBC: 6.7 10*3/uL (ref 4.0–10.5)
nRBC: 0 % (ref 0.0–0.2)

## 2022-01-02 LAB — CANCER ANTIGEN 19-9: CA 19-9: 25 U/mL (ref 0–35)

## 2022-01-02 MED ORDER — LIDOCAINE HCL 1 % IJ SOLN
INTRAMUSCULAR | Status: AC
  Administered 2022-01-02: 10 mL via SUBCUTANEOUS
  Filled 2022-01-02: qty 20

## 2022-01-02 MED ORDER — MIDAZOLAM HCL 2 MG/2ML IJ SOLN
INTRAMUSCULAR | Status: AC | PRN
  Administered 2022-01-02 (×2): 1 mg via INTRAVENOUS

## 2022-01-02 MED ORDER — FENTANYL CITRATE (PF) 100 MCG/2ML IJ SOLN
INTRAMUSCULAR | Status: AC | PRN
  Administered 2022-01-02: 100 ug via INTRAVENOUS
  Administered 2022-01-02 (×2): 50 ug via INTRAVENOUS

## 2022-01-02 MED ORDER — MIDAZOLAM HCL 2 MG/2ML IJ SOLN
INTRAMUSCULAR | Status: AC
  Filled 2022-01-02: qty 4

## 2022-01-02 MED ORDER — SODIUM CHLORIDE 0.9% FLUSH
5.0000 mL | Freq: Three times a day (TID) | INTRAVENOUS | Status: DC
  Administered 2022-01-02 – 2022-01-04 (×5): 5 mL

## 2022-01-02 MED ORDER — TRAMADOL HCL 50 MG PO TABS
50.0000 mg | ORAL_TABLET | Freq: Four times a day (QID) | ORAL | Status: DC | PRN
  Administered 2022-01-02 – 2022-01-03 (×3): 50 mg via ORAL
  Filled 2022-01-02 (×3): qty 1

## 2022-01-02 MED ORDER — FENTANYL CITRATE (PF) 100 MCG/2ML IJ SOLN
INTRAMUSCULAR | Status: AC
  Filled 2022-01-02: qty 4

## 2022-01-02 MED ORDER — IOHEXOL 300 MG/ML  SOLN
25.0000 mL | Freq: Once | INTRAMUSCULAR | Status: AC | PRN
  Administered 2022-01-02: 10 mL

## 2022-01-02 MED ORDER — MORPHINE SULFATE (PF) 2 MG/ML IV SOLN: 1.0000 mg | INTRAVENOUS | Status: DC | PRN

## 2022-01-02 NOTE — Progress Notes (Signed)
PHARMACY - PHYSICIAN COMMUNICATION CRITICAL VALUE ALERT - BLOOD CULTURE IDENTIFICATION (BCID)  Patrick Collier is an 81 y.o. male who presented to [facility] on 01/01/2022 with a chief complaint of fever/confusion  Assessment: 81 YOM with history of cholangiocarcinoma and now acute cholecystitis with intra-abd abscess seen on imaging. 2 of 4 blood culture bottles now growing GNR with BCID detecting Kleb PNA - no resistance genes detected.   Name of physician (or Provider) ContactedMaylene Roes  Current antibiotics: Ceftriaxone + Metronidazole  Changes to prescribed antibiotics recommended:  Patient is on recommended antibiotics - No changes needed - would continue flagyl in the setting of intra-abd abscess.   Results for orders placed or performed during the hospital encounter of 01/01/22  Blood Culture ID Panel (Reflexed) (Collected: 01/01/2022  3:46 PM)  Result Value Ref Range   Enterococcus faecalis NOT DETECTED NOT DETECTED   Enterococcus Faecium NOT DETECTED NOT DETECTED   Listeria monocytogenes NOT DETECTED NOT DETECTED   Staphylococcus species NOT DETECTED NOT DETECTED   Staphylococcus aureus (BCID) NOT DETECTED NOT DETECTED   Staphylococcus epidermidis NOT DETECTED NOT DETECTED   Staphylococcus lugdunensis NOT DETECTED NOT DETECTED   Streptococcus species NOT DETECTED NOT DETECTED   Streptococcus agalactiae NOT DETECTED NOT DETECTED   Streptococcus pneumoniae NOT DETECTED NOT DETECTED   Streptococcus pyogenes NOT DETECTED NOT DETECTED   A.calcoaceticus-baumannii NOT DETECTED NOT DETECTED   Bacteroides fragilis NOT DETECTED NOT DETECTED   Enterobacterales DETECTED (A) NOT DETECTED   Enterobacter cloacae complex NOT DETECTED NOT DETECTED   Escherichia coli NOT DETECTED NOT DETECTED   Klebsiella aerogenes NOT DETECTED NOT DETECTED   Klebsiella oxytoca NOT DETECTED NOT DETECTED   Klebsiella pneumoniae DETECTED (A) NOT DETECTED   Proteus species NOT DETECTED NOT DETECTED    Salmonella species NOT DETECTED NOT DETECTED   Serratia marcescens NOT DETECTED NOT DETECTED   Haemophilus influenzae NOT DETECTED NOT DETECTED   Neisseria meningitidis NOT DETECTED NOT DETECTED   Pseudomonas aeruginosa NOT DETECTED NOT DETECTED   Stenotrophomonas maltophilia NOT DETECTED NOT DETECTED   Candida albicans NOT DETECTED NOT DETECTED   Candida auris NOT DETECTED NOT DETECTED   Candida glabrata NOT DETECTED NOT DETECTED   Candida krusei NOT DETECTED NOT DETECTED   Candida parapsilosis NOT DETECTED NOT DETECTED   Candida tropicalis NOT DETECTED NOT DETECTED   Cryptococcus neoformans/gattii NOT DETECTED NOT DETECTED   CTX-M ESBL NOT DETECTED NOT DETECTED   Carbapenem resistance IMP NOT DETECTED NOT DETECTED   Carbapenem resistance KPC NOT DETECTED NOT DETECTED   Carbapenem resistance NDM NOT DETECTED NOT DETECTED   Carbapenem resist OXA 48 LIKE NOT DETECTED NOT DETECTED   Carbapenem resistance VIM NOT DETECTED NOT DETECTED    Thank you for allowing pharmacy to be a part of this patient's care.  Alycia Rossetti, PharmD, BCPS Infectious Diseases Clinical Pharmacist 01/02/2022 9:35 AM   **Pharmacist phone directory can now be found on [email or web address] ([email or web address]).  Listed under [place].

## 2022-01-02 NOTE — Consult Note (Addendum)
Patrick Collier 11-Apr-1941  [id number].    Requesting MD: Dr. Dessa Phi Chief Complaint/Reason for Consult: Abdominal pain  HPI: Patrick Collier is 81 y.o. male who asked to see for abnormal CT scan.  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 Burr Medico.  He previously met with Dr. Zenia Resides discussed Whipple surgery but did not wish to pursue this.  He has undergone ERCP and stenting. He previously was on chemo/Xeloda for this but stopped on 8/22 given side effects.  He is currently undergoing radiation with last therapy yesterday.  Reports he has been having daily episodes of right upper quadrant abdominal pain for the last several weeks.  Notes he has baseline nausea and poor appetite and has been losing weight. Wife reports he really has not had much po intake over the last 1 week. His daily RUQ pain has not been getting better or worse. Not related to PO intake. Having normal bm's. No urinary symptoms. Had a fever of 101.4 yesterday. Sent over for further w/u. Work-up showed normal WBC at 6.7, normal lactic acid at 0.9, Lipase wnl, LFT's wnl and CT w/ gallbladder wall thickening with mild surrounding inflammation and 2 enhancing fluid attenuation areas within the gallbladder fossa (radiology felt one is felt to be likely the gallbladder) with the larger collection measuring 2.0 x 6.3 cm. He is not on blood thinners. Hx of L inguinal hernia repair. Lives at home with his wife. Reports he has been tired and fatigued throughout the day requiring multiple naps and has not been as mobile.   ROS: ROS As above, see hpi  Family History  Problem Relation Age of Onset   Diabetes Mother     Past Medical History:  Diagnosis Date   Arthritis    Back pain    Heart murmur    History of kidney stones    ICH (intracerebral hemorrhage) ([place])     Past Surgical History:  Procedure Laterality Date   BACK SURGERY     BILIARY BRUSHING  10/11/2021    Procedure: BILIARY BRUSHING;  Surgeon: Ladene Artist, MD;  Location: [facility];  Service: Gastroenterology;;   BILIARY BRUSHING  11/21/2021   Procedure: BILIARY BRUSHING;  Surgeon: Clarene Essex, MD;  Location: [facility];  Service: Gastroenterology;;   BILIARY STENT PLACEMENT N/A 10/11/2021   Procedure: BILIARY STENT PLACEMENT;  Surgeon: Ladene Artist, MD;  Location: [facility];  Service: Gastroenterology;  Laterality: N/A;   BILIARY STENT PLACEMENT N/A 11/21/2021   Procedure: BILIARY STENT PLACEMENT;  Surgeon: Clarene Essex, MD;  Location: [facility];  Service: Gastroenterology;  Laterality: N/A;   BIOPSY  11/21/2021   Procedure: BIOPSY;  Surgeon: Clarene Essex, MD;  Location: [facility];  Service: Gastroenterology;;   ERCP N/A 10/11/2021   Procedure: ENDOSCOPIC RETROGRADE CHOLANGIOPANCREATOGRAPHY (ERCP);  Surgeon: Ladene Artist, MD;  Location: [facility];  Service: Gastroenterology;  Laterality: N/A;   ERCP N/A 11/21/2021   Procedure: ENDOSCOPIC RETROGRADE CHOLANGIOPANCREATOGRAPHY (ERCP);  Surgeon: Clarene Essex, MD;  Location: [facility];  Service: Gastroenterology;  Laterality: N/A;  with Spyglass   ESOPHAGOGASTRODUODENOSCOPY (EGD) WITH PROPOFOL N/A 10/11/2021   Procedure: ESOPHAGOGASTRODUODENOSCOPY (EGD) WITH PROPOFOL;  Surgeon: Arta Silence, MD;  [facility];  Service: Gastroenterology;  Laterality: N/A;   EUS N/A 10/11/2021   Procedure: UPPER ENDOSCOPIC ULTRASOUND (EUS) LINEAR;  Surgeon: Arta Silence, MD;  Location: [facility];  Service: Gastroenterology;  Laterality: N/A;   FINE NEEDLE ASPIRATION N/A 10/11/2021  Procedure: FINE NEEDLE ASPIRATION (FNA) LINEAR;  Surgeon: Arta Silence, MD;  [facility]: [facility];  Service: Gastroenterology;  Laterality: N/A;   INGUINAL HERNIA REPAIR     KNEE ARTHROPLASTY Right 05/16/2017   Procedure: RIGHT TOTAL KNEE ARTHROPLASTY WITH COMPUTER NAVIGATION;  Surgeon: Rod Can, MD;  Location: [facility];  Service: Orthopedics;   Laterality: Right;  Needs RNFA   NECK SURGERY     SPHINCTEROTOMY  10/11/2021   Procedure: SPHINCTEROTOMY;  Surgeon: Ladene Artist, MD;  Location: [facility];  Service: Gastroenterology;;   Patrick Collier  11/21/2021   Procedure: Patrick Collier;  Surgeon: Clarene Essex, MD;  Location: [facility];  Service: Gastroenterology;;   [name] CHOLANGIOSCOPY N/A 11/21/2021   Procedure: DJSHFWYO CHOLANGIOSCOPY;  Surgeon: Clarene Essex, MD;  [facility];  Service: Gastroenterology;  Laterality: N/A;   STENT REMOVAL  11/21/2021   Procedure: STENT REMOVAL;  Surgeon: Clarene Essex, MD;  Location: [facility];  Service: Gastroenterology;;   TONSILLECTOMY      Social History:  reports that he has quit smoking. He has never used smokeless tobacco. He reports current alcohol use. He reports that he does not use drugs.  Allergies: No Known Allergies  Medications Prior to Admission  Medication Sig Dispense Refill   capecitabine (XELODA) 500 MG tablet Take 3 tablets (1,500 mg total) by mouth 2 (two) times daily. Take every 12 hours within 30 minutes after meals. Take only on days of radiation, Monday through Friday. 90 tablet 1   lactulose (CHRONULAC) 10 GM/15ML solution Take 30 mLs (20 g total) by mouth 2 (two) times daily as needed for mild constipation or moderate constipation. (Patient taking differently: Take 20 g by mouth 2 (two) times daily.) 473 mL 0   ondansetron (ZOFRAN) 8 MG tablet Take 1 tablet (8 mg total) by mouth every 8 (eight) hours as needed for nausea or vomiting. 20 tablet 0     Physical Exam: Blood pressure 120/68, pulse (!) 52, temperature 97.8 F (36.6 C), temperature source Oral, resp. rate 18, height 5' 6"  (1.676 m), weight 73.8 kg, SpO2 97 %. General: pleasant, WD/WN white male who is laying in bed in NAD HEENT: head is normocephalic, atraumatic.  Sclera are noninjected.  PERRL.  Ears and nose without any masses or lesions.  Mouth is pink and moist. Dentition fair Heart:  regular, rate, and rhythm.  Lungs: CTAB, no wheezes, rhonchi, or rales noted.  Respiratory effort nonlabored Abd: Soft,ND, RUQ ttp without rigidity or guarding, +BS MS: no BLE edema Skin: warm and dry with no masses, lesions, or rashes Psych: A&Ox2 with an appropriate affect (orientated to self, and place - thought it was 2013. Was unsure why exactly he was in the hospital) Neuro: cranial nerves grossly intact, normal speech, thought process intact, moves all extremities, gait not assessed  Results for orders placed or performed during the hospital encounter of 01/01/22 (from the past 48 hour(s))  Culture, blood (routine x 2)     Status: None (Preliminary result)   Collection Time: 01/01/22  3:46 PM   Specimen: BLOOD  Result Value Ref Range   Specimen Description      BLOOD BLOOD LEFT FOREARM Performed at [facility] [address], [place], [place] [address]    Special Requests      BOTTLES DRAWN AEROBIC AND ANAEROBIC Blood Culture results may not be optimal due to an excessive volume of blood received in culture bottles Performed at [facility] [address], [place], [place] [address]  Culture  Setup Time      GRAM NEGATIVE RODS ANAEROBIC BOTTLE ONLY Organism ID to follow Performed at [facility], [place] [address] [place], [place] [address]    Culture PENDING    Report Status PENDING   Comprehensive metabolic panel     Status: Abnormal   Collection Time: 01/01/22  4:21 PM  Result Value Ref Range   Sodium 136 135 - 145 mmol/L   Potassium 3.3 (L) 3.5 - 5.1 mmol/L   Chloride 101 98 - 111 mmol/L   CO2 26 22 - 32 mmol/L   Glucose, Bld 124 (H) 70 - 99 mg/dL    Comment: Glucose reference range applies only to samples taken after fasting for at least 8 hours.   BUN 10 8 - 23 mg/dL   Creatinine, Ser 0.73 0.61 - 1.24 mg/dL   Calcium 8.1 (L) 8.9 - 10.3 mg/dL   Total Protein 6.6 6.5 - 8.1 g/dL   Albumin 2.8 (L) 3.5 - 5.0 g/dL   AST  20 15 - 41 U/L   ALT 12 0 - 44 U/L   Alkaline Phosphatase 74 38 - 126 U/L   Total Bilirubin 1.0 0.3 - 1.2 mg/dL   GFR, Estimated >60 >60 mL/min    Comment: (NOTE) Calculated using the CKD-EPI Creatinine Equation (2021)    Anion gap 9 5 - 15    Comment: Performed at [facility], [place] [address], [place], [place] [address]  CBC with Differential     Status: Abnormal   Collection Time: 01/01/22  4:21 PM  Result Value Ref Range   WBC 9.7 4.0 - 10.5 K/uL   RBC 3.51 (L) 4.22 - 5.81 MIL/uL   Hemoglobin 11.5 (L) 13.0 - 17.0 g/dL   HCT 34.0 (L) 39.0 - 52.0 %   MCV 96.9 80.0 - 100.0 fL   MCH 32.8 26.0 - 34.0 pg   MCHC 33.8 30.0 - 36.0 g/dL   RDW 13.6 11.5 - 15.5 %   Platelets 144 (L) 150 - 400 K/uL   nRBC 0.0 0.0 - 0.2 %   Neutrophils Relative % 84 %   Neutro Abs 8.1 (H) 1.7 - 7.7 K/uL   Lymphocytes Relative 1 %   Lymphs Abs 0.1 (L) 0.7 - 4.0 K/uL   Monocytes Relative 14 %   Monocytes Absolute 1.4 (H) 0.1 - 1.0 K/uL   Eosinophils Relative 0 %   Eosinophils Absolute 0.0 0.0 - 0.5 K/uL   Basophils Relative 0 %   Basophils Absolute 0.0 0.0 - 0.1 K/uL   Immature Granulocytes 1 %   Abs Immature Granulocytes 0.05 0.00 - 0.07 K/uL    Comment: Performed at [facility], [place] [address], [place], [place] [address]  Lipase, blood     Status: None   Collection Time: 01/01/22  4:21 PM  Result Value Ref Range   Lipase 26 11 - 51 U/L    Comment: Performed at [facility], [place] [address] [place], [place] [address]  Lactic acid, plasma     Status: None   Collection Time: 01/01/22  4:21 PM  Result Value Ref Range   Lactic Acid, Venous 0.9 0.5 - 1.9 mmol/L    Comment: Performed at [facility], [place] [address], [place], [place] [address]  Protime-INR     Status: None   Collection Time: 01/01/22  4:21 PM  Result Value Ref Range   Prothrombin Time 14.3 11.4 - 15.2 seconds  INR 1.1 0.8 - 1.2    Comment: (NOTE) INR goal  varies based on device and disease states. Performed at [facility], [place] [address], [place], [place] [address]   Resp Panel by RT-PCR (Flu A&B, Covid) Anterior Nasal Swab     Status: None   Collection Time: 01/01/22  4:43 PM   Specimen: Anterior Nasal Swab  Result Value Ref Range   SARS Coronavirus 2 by RT PCR NEGATIVE NEGATIVE    Comment: (NOTE) SARS-CoV-2 target nucleic acids are NOT DETECTED.  The SARS-CoV-2 RNA is generally detectable in upper respiratory specimens during the acute phase of infection. The lowest concentration of SARS-CoV-2 viral copies this assay can detect is 138 copies/mL. A negative result does not preclude SARS-Cov-2 infection and should not be used as the sole basis for treatment or other patient management decisions. A negative result may occur with  improper specimen collection/handling, submission of specimen other than nasopharyngeal swab, presence of viral mutation(s) within the areas targeted by this assay, and inadequate number of viral copies(<138 copies/mL). A negative result must be combined with clinical observations, patient history, and epidemiological information. The expected result is Negative.  Fact Sheet for Patients:  EntrepreneurPulse.com.au  Fact Sheet for Healthcare Providers:  IncredibleEmployment.be  This test is no t yet approved or cleared by the [place] FDA and  has been authorized for detection and/or diagnosis of SARS-CoV-2 by FDA under an Emergency Use Authorization (EUA). This EUA will remain  in effect (meaning this test can be used) for the duration of the COVID-19 declaration under Section 564(b)(1) of the Act, 21 U.S.C.section 360bbb-3(b)(1), unless the authorization is terminated  or revoked sooner.       Influenza A by PCR NEGATIVE NEGATIVE   Influenza B by PCR NEGATIVE NEGATIVE    Comment: (NOTE) The Xpert Xpress SARS-CoV-2/FLU/RSV plus assay is  intended as an aid in the diagnosis of influenza from Nasopharyngeal swab specimens and should not be used as a sole basis for treatment. Nasal washings and aspirates are unacceptable for Xpert Xpress SARS-CoV-2/FLU/RSV testing.  Fact Sheet for Patients: EntrepreneurPulse.com.au  Fact Sheet for Healthcare Providers: IncredibleEmployment.be  This test is not yet approved or cleared by the [place] FDA and has been authorized for detection and/or diagnosis of SARS-CoV-2 by FDA under an Emergency Use Authorization (EUA). This EUA will remain in effect (meaning this test can be used) for the duration of the COVID-19 declaration under Section 564(b)(1) of the Act, 21 U.S.C. section 360bbb-3(b)(1), unless the authorization is terminated or revoked.  Performed at [facility], [place] [address], [place], [place] [address]   Urinalysis, Routine w reflex microscopic Urine, Clean Catch     Status: Abnormal   Collection Time: 01/01/22  7:57 PM  Result Value Ref Range   Color, Urine YELLOW YELLOW   APPearance CLEAR CLEAR   Specific Gravity, Urine >1.046 (H) 1.005 - 1.030   pH 5.0 5.0 - 8.0   Glucose, UA NEGATIVE NEGATIVE mg/dL   Hgb urine dipstick SMALL (A) NEGATIVE   Bilirubin Urine NEGATIVE NEGATIVE   Ketones, ur 5 (A) NEGATIVE mg/dL   Protein, ur NEGATIVE NEGATIVE mg/dL   Nitrite NEGATIVE NEGATIVE   Leukocytes,Ua NEGATIVE NEGATIVE   RBC / HPF 0-5 0 - 5 RBC/hpf   WBC, UA 0-5 0 - 5 WBC/hpf   Bacteria, UA NONE SEEN NONE SEEN   Mucus PRESENT     Comment: Performed at [facility], [place] [address]  [place], [place] [address]  CBC     Status: Abnormal   Collection Time: 01/02/22  4:50 AM  Result Value Ref Range   WBC 6.7 4.0 - 10.5 K/uL   RBC 3.60 (L) 4.22 - 5.81 MIL/uL   Hemoglobin 11.6 (L) 13.0 - 17.0 g/dL   HCT 35.5 (L) 39.0 - 52.0 %   MCV 98.6 80.0 - 100.0 fL   MCH 32.2 26.0 - 34.0 pg   MCHC 32.7 30.0 -  36.0 g/dL   RDW 13.5 11.5 - 15.5 %   Platelets 133 (L) 150 - 400 K/uL   nRBC 0.0 0.0 - 0.2 %    Comment: Performed at [facility], [place] [address] [place], [place] [address]  Basic metabolic panel     Status: Abnormal   Collection Time: 01/02/22  4:50 AM  Result Value Ref Range   Sodium 135 135 - 145 mmol/L   Potassium 4.3 3.5 - 5.1 mmol/L   Chloride 102 98 - 111 mmol/L   CO2 28 22 - 32 mmol/L   Glucose, Bld 104 (H) 70 - 99 mg/dL    Comment: Glucose reference range applies only to samples taken after fasting for at least 8 hours.   BUN 10 8 - 23 mg/dL   Creatinine, Ser 0.75 0.61 - 1.24 mg/dL   Calcium 8.3 (L) 8.9 - 10.3 mg/dL   GFR, Estimated >60 >60 mL/min    Comment: (NOTE) Calculated using the CKD-EPI Creatinine Equation (2021)    Anion gap 5 5 - 15    Comment: Performed at [facility], [place] [address] [place], [place] [address]   CT Abdomen Pelvis W Contrast  Result Date: 01/01/2022 CLINICAL DATA:  Acute abdominal pain. Radiation therapy to bile ducts today. EXAM: CT ABDOMEN AND PELVIS WITH CONTRAST TECHNIQUE: Multidetector CT imaging of the abdomen and pelvis was performed using the standard protocol following bolus administration of intravenous contrast. RADIATION DOSE REDUCTION: This exam was performed according to the departmental dose-optimization program which includes automated exposure control, adjustment of the mA and/or kV according to patient size and/or use of iterative reconstruction technique. CONTRAST:  160m OMNIPAQUE IOHEXOL 300 MG/ML  SOLN COMPARISON:  MRI abdomen 10/07/2021. FINDINGS: Lower chest: There is atelectasis in the lung bases. Hepatobiliary: Common bile duct stent is in place. There is pneumobilia likely related to the stent. There is gallbladder wall thickening with mild surrounding inflammation. There are 2 enhancing fluid attenuation areas within the gallbladder fossa. One is likely the gallbladder and other is  likely abnormal fluid collection such as abscess or biloma. The larger collection is more posterior measuring 2.0 x 6.3 cm. In the smaller collection is more anterior measuring 3.7 x 1.4 cm. No new focal liver lesions are identified. Pancreas: 1 cm cystic lesion in the pancreatic tail appears unchanged. No pancreatic ductal dilatation or surrounding inflammation. Spleen: Normal in size without focal abnormality. Adrenals/Urinary Tract: Adrenal glands are unremarkable. Kidneys are normal, without renal calculi, focal lesion, or hydronephrosis. Bladder is unremarkable. Stomach/Bowel: Stomach is within normal limits. No evidence of bowel wall thickening, distention, or inflammatory changes. The appendix is not seen. There is sigmoid colon diverticulosis. Vascular/Lymphatic: Aortic atherosclerosis. No enlarged abdominal or pelvic lymph nodes. Reproductive: Prostate is unremarkable. Other: No ascites.  Small fat containing left inguinal hernia. Musculoskeletal: Severe degenerative changes affect the spine. There are moderate degenerative changes of both hips. IMPRESSION: 1. There are 2 enhancing fluid collections in the gallbladder fossa. One is felt to represent  gallbladder and the other is likely abnormal fluid collection such as biloma or abscess. These collections demonstrate wall thickening with surrounding inflammation. 2. New common bile duct stent in place with pneumobilia. Electronically Signed   By: Ronney Asters M.D.   On: 01/01/2022 17:43   CT Head Wo Contrast  Result Date: 01/01/2022 CLINICAL DATA:  Mental status change EXAM: CT HEAD WITHOUT CONTRAST TECHNIQUE: Contiguous axial images were obtained from the base of the skull through the vertex without intravenous contrast. RADIATION DOSE REDUCTION: This exam was performed according to the departmental dose-optimization program which includes automated exposure control, adjustment of the mA and/or kV according to patient size and/or use of iterative  reconstruction technique. COMPARISON:  None Available. FINDINGS: Brain: No acute territorial infarction, hemorrhage or intracranial mass. Moderate atrophy. Mild chronic small vessel ischemic changes of the white matter. The ventricles are nonenlarged. Vascular: No hyperdense vessels.  Carotid vascular calcification. Skull: Normal. Negative for fracture or focal lesion. Sinuses/Orbits: No acute finding. Other: None IMPRESSION: 1. No CT evidence for acute intracranial abnormality. 2. Atrophy and mild chronic small vessel ischemic changes of the white matter Electronically Signed   By: Donavan Foil M.D.   On: 01/01/2022 17:32   DG Chest 1 View  Result Date: 01/01/2022 CLINICAL DATA:  Cough. EXAM: CHEST  1 VIEW COMPARISON:  CT of the chest 11/03/2021 FINDINGS: The heart size and mediastinal contours are within normal limits. Both lungs are clear. The visualized skeletal structures are unremarkable. IMPRESSION: No active disease. Electronically Signed   By: Ronney Asters M.D.   On: 01/01/2022 17:27    Anti-infectives (From admission, onward)    Start     Dose/Rate Route Frequency Ordered Stop   01/02/22 1600  cefTRIAXone (ROCEPHIN) 2 g in sodium chloride 0.9 % 100 mL IVPB        2 g 200 mL/hr over 30 Minutes Intravenous Every 24 hours 01/01/22 2131     01/02/22 0800  metroNIDAZOLE (FLAGYL) IVPB 500 mg        500 mg 100 mL/hr over 60 Minutes Intravenous Every 12 hours 01/01/22 2131     01/01/22 1815  metroNIDAZOLE (FLAGYL) IVPB 500 mg        500 mg 100 mL/hr over 60 Minutes Intravenous  Once 01/01/22 1807 01/01/22 2159   01/01/22 1600  cefTRIAXone (ROCEPHIN) 1 g in sodium chloride 0.9 % 100 mL IVPB  Status:  Discontinued        1 g 200 mL/hr over 30 Minutes Intravenous  Once 01/01/22 1546 01/01/22 1549   01/01/22 1600  cefTRIAXone (ROCEPHIN) 2 g in sodium chloride 0.9 % 100 mL IVPB        2 g 200 mL/hr over 30 Minutes Intravenous  Once 01/01/22 1549 01/01/22 1812   01/01/22 1600  vancomycin  (VANCOREADY) IVPB 1500 mg/300 mL        1,500 mg 150 mL/hr over 120 Minutes Intravenous  Once 01/01/22 1550 01/01/22 2015       Assessment/Plan This is a 81 year old male with a 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  Work-up showed normal WBC at 6.7, normal lactic acid at 0.9, Lipase wnl, LFT's wnl and CT w/ gallbladder wall thickening with mild surrounding inflammation and 2 enhancing fluid attenuation areas within the gallbladder fossa (radiology felt one is felt to be likely the gallbladder) with the larger collection measuring 2.0 x 6.3 cm.  Agree  with admission and IV antibiotics.  After discussion with one of our hepatobiliary surgeons would recommend percutaneous cholecystostomy tube.  We will consult IR.  We will continue to follow.  Keep n.p.o. for now while awaiting recommendations by IR.   Jillyn Ledger, [facility] Surgery 01/02/2022, 8:25 AM Please see Amion for pager number during day hours 7:00am-4:30pm

## 2022-01-02 NOTE — Progress Notes (Addendum)
PROGRESS NOTE    Patrick Collier  OHY:[id number] DOB: 14-Jul-1940 DOA: 01/01/2022 PCP: London Pepper, MD     Brief Narrative:  Patrick Collier is a 81 y.o. 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 Per report pt had received radiation and then was unable to move his extremities to get out of the car at home. Wife reports he was confused, although at time of admission, the confusion had been cleared. He was found to have fever, CT A/P showing gallbladder wall thickening with mild surrounding inflammation.  Also possible abscess versus bilioma fluid collection. Patient was started on IV antibiotics and admitted to the hospital. General surgery consulted.   New events last 24 hours / Subjective: Ambulating around the room. Has no complaints.   Assessment & Plan:   Principal Problem:   Acute cholecystitis Active Problems:   Cholangiocarcinoma ([place])   Acute metabolic encephalopathy   Thrombocytopenia ([facility])   Bacteremia   GNR bacteremia and acute cholecystitis -Rocephin, flagyl  -General surgery consulted  -IR consulted   Cholangiocarcinoma -S/p stent placement in common bile duct following ERCP showing CBD mass on 10/11/2021.  Had repeat ERCP on 11/21/2021 for stent change.  Pathology and cytology again were nondiagnostic but suspicious.  -Followed by Dr. Burr Medico and radiation oncology. Undergoing radiotherapy and chemosensitization  Acute metabolic encephalopathy -In setting of infection -Resolved    DVT prophylaxis:  SCDs Start: 01/01/22 2131  Code Status: DNR Family Communication: None at bedside  Disposition Plan:  Status is: Inpatient Remains inpatient appropriate because: IV antibiotics   Antimicrobials:  Anti-infectives (From admission, onward)    Start     Dose/Rate Route Frequency Ordered Stop   01/02/22 1600  cefTRIAXone (ROCEPHIN) 2 g  in sodium chloride 0.9 % 100 mL IVPB        2 g 200 mL/hr over 30 Minutes Intravenous Every 24 hours 01/01/22 2131     01/02/22 0800  metroNIDAZOLE (FLAGYL) IVPB 500 mg        500 mg 100 mL/hr over 60 Minutes Intravenous Every 12 hours 01/01/22 2131     01/01/22 1815  metroNIDAZOLE (FLAGYL) IVPB 500 mg        500 mg 100 mL/hr over 60 Minutes Intravenous  Once 01/01/22 1807 01/01/22 2159   01/01/22 1600  cefTRIAXone (ROCEPHIN) 1 g in sodium chloride 0.9 % 100 mL IVPB  Status:  Discontinued        1 g 200 mL/hr over 30 Minutes Intravenous  Once 01/01/22 1546 01/01/22 1549   01/01/22 1600  cefTRIAXone (ROCEPHIN) 2 g in sodium chloride 0.9 % 100 mL IVPB        2 g 200 mL/hr over 30 Minutes Intravenous  Once 01/01/22 1549 01/01/22 1812   01/01/22 1600  vancomycin (VANCOREADY) IVPB 1500 mg/300 mL        1,500 mg 150 mL/hr over 120 Minutes Intravenous  Once 01/01/22 1550 01/01/22 2015        Objective: Vitals:   01/01/22 2011 01/02/22 0004 01/02/22 0439 01/02/22 0947  BP:  108/69 120/68 113/70  Pulse:  (!) 50 (!) 52 (!) 56  Resp:  '16 18 18  '$ Temp:  97.6 F (36.4 C) 97.8 F (36.6 C) 98.4 F (36.9 C)  TempSrc:  Oral Oral Oral  SpO2:  99% 97% 97%  Weight: 73.8 kg     Height: '5\' 6"'$  (1.676 m)  Intake/Output Summary (Last 24 hours) at 01/02/2022 1111 Last data filed at 01/02/2022 1000 Gross per 24 hour  Intake 340 ml  Output 900 ml  Net -560 ml   Filed Weights   01/01/22 2011  Weight: 73.8 kg    Examination:  General exam: Appears calm and comfortable  Respiratory system: Clear to auscultation. Respiratory effort normal. No respiratory distress. No conversational dyspnea.  Cardiovascular system: S1 & S2 heard, RRR. No murmurs. No pedal edema. Gastrointestinal system: Abdomen is nondistended, soft and mild TTP RUQ Central nervous system: Alert and oriented. No focal neurological deficits. Speech clear.  Extremities: Symmetric in appearance  Skin: No rashes, lesions or  ulcers on exposed skin  Psychiatry: Judgement and insight appear normal. Mood & affect appropriate.   Data Reviewed: I have personally reviewed following labs and imaging studies  CBC: Recent Labs  Lab 01/01/22 1230 01/01/22 1621 01/02/22 0450  WBC 6.2 9.7 6.7  NEUTROABS 5.6 8.1*  --   HGB 13.2 11.5* 11.6*  HCT 39.7 34.0* 35.5*  MCV 96.6 96.9 98.6  PLT 170 144* 154*   Basic Metabolic Panel: Recent Labs  Lab 01/01/22 1230 01/01/22 1621 01/02/22 0450  NA 135 136 135  K 3.7 3.3* 4.3  CL 99 101 102  CO2 '31 26 28  '$ GLUCOSE 119* 124* 104*  BUN '10 10 10  '$ CREATININE 0.87 0.73 0.75  CALCIUM 9.2 8.1* 8.3*   GFR: Estimated Creatinine Clearance: 65.4 mL/min (by C-G formula based on SCr of 0.75 mg/dL). Liver Function Tests: Recent Labs  Lab 01/01/22 1230 01/01/22 1621  AST 20 20  ALT 11 12  ALKPHOS 84 74  BILITOT 0.9 1.0  PROT 7.6 6.6  ALBUMIN 3.6 2.8*   Recent Labs  Lab 01/01/22 1621  LIPASE 26   No results for input(s): "AMMONIA" in the last 168 hours. Coagulation Profile: Recent Labs  Lab 01/01/22 1621  INR 1.1   Cardiac Enzymes: No results for input(s): "CKTOTAL", "CKMB", "CKMBINDEX", "TROPONINI" in the last 168 hours. BNP (last 3 results) No results for input(s): "PROBNP" in the last 8760 hours. HbA1C: No results for input(s): "HGBA1C" in the last 72 hours. CBG: No results for input(s): "GLUCAP" in the last 168 hours. Lipid Profile: No results for input(s): "CHOL", "HDL", "LDLCALC", "TRIG", "CHOLHDL", "LDLDIRECT" in the last 72 hours. Thyroid Function Tests: No results for input(s): "TSH", "T4TOTAL", "FREET4", "T3FREE", "THYROIDAB" in the last 72 hours. Anemia Panel: No results for input(s): "VITAMINB12", "FOLATE", "FERRITIN", "TIBC", "IRON", "RETICCTPCT" in the last 72 hours. Sepsis Labs: Recent Labs  Lab 01/01/22 1621  LATICACIDVEN 0.9    Recent Results (from the past 240 hour(s))  Culture, blood (routine x 2)     Status: None (Preliminary  result)   Collection Time: 01/01/22  3:46 PM   Specimen: BLOOD  Result Value Ref Range Status   Specimen Description   Final    BLOOD BLOOD LEFT FOREARM Performed at [facility], [place] [address], [place], [place] [address]    Special Requests   Final    BOTTLES DRAWN AEROBIC AND ANAEROBIC Blood Culture results may not be optimal due to an excessive volume of blood received in culture bottles Performed at [facility] [address] [place], [place] [address]    Culture  Setup Time   Final    GRAM NEGATIVE RODS ANAEROBIC BOTTLE ONLY Organism ID to follow CRITICAL RESULT CALLED TO, READ BACK BY AND VERIFIED WITH: Damian Leavell [id number] BY EC  Performed at [facility], [place] [address] [place], [place] [address]    Culture GRAM NEGATIVE RODS  Final   Report Status PENDING  Incomplete  Blood Culture ID Panel (Reflexed)     Status: Abnormal   Collection Time: 01/01/22  3:46 PM  Result Value Ref Range Status   Enterococcus faecalis NOT DETECTED NOT DETECTED Final   Enterococcus Faecium NOT DETECTED NOT DETECTED Final   Listeria monocytogenes NOT DETECTED NOT DETECTED Final   Staphylococcus species NOT DETECTED NOT DETECTED Final   Staphylococcus aureus (BCID) NOT DETECTED NOT DETECTED Final   Staphylococcus epidermidis NOT DETECTED NOT DETECTED Final   Staphylococcus lugdunensis NOT DETECTED NOT DETECTED Final   Streptococcus species NOT DETECTED NOT DETECTED Final   Streptococcus agalactiae NOT DETECTED NOT DETECTED Final   Streptococcus pneumoniae NOT DETECTED NOT DETECTED Final   Streptococcus pyogenes NOT DETECTED NOT DETECTED Final   A.calcoaceticus-baumannii NOT DETECTED NOT DETECTED Final   Bacteroides fragilis NOT DETECTED NOT DETECTED Final   Enterobacterales DETECTED (A) NOT DETECTED Final    Comment: Enterobacterales represent a large order of gram negative bacteria, not a single organism. CRITICAL RESULT CALLED TO, READ  BACK BY AND VERIFIED WITH: [place] [id number] AT 6222 BY EC    Enterobacter cloacae complex NOT DETECTED NOT DETECTED Final   Escherichia coli NOT DETECTED NOT DETECTED Final   Klebsiella aerogenes NOT DETECTED NOT DETECTED Final   Klebsiella oxytoca NOT DETECTED NOT DETECTED Final   Klebsiella pneumoniae DETECTED (A) NOT DETECTED Final    Comment: CRITICAL RESULT CALLED TO, READ BACK BY AND VERIFIED WITH: PHARMD J. Scherrie November [id number] AT 0857 BY EC    Proteus species NOT DETECTED NOT DETECTED Final   Salmonella species NOT DETECTED NOT DETECTED Final   Serratia marcescens NOT DETECTED NOT DETECTED Final   Haemophilus influenzae NOT DETECTED NOT DETECTED Final   Neisseria meningitidis NOT DETECTED NOT DETECTED Final   Pseudomonas aeruginosa NOT DETECTED NOT DETECTED Final   Stenotrophomonas maltophilia NOT DETECTED NOT DETECTED Final   Candida albicans NOT DETECTED NOT DETECTED Final   Candida auris NOT DETECTED NOT DETECTED Final   Candida glabrata NOT DETECTED NOT DETECTED Final   Candida krusei NOT DETECTED NOT DETECTED Final   Candida parapsilosis NOT DETECTED NOT DETECTED Final   Candida tropicalis NOT DETECTED NOT DETECTED Final   Cryptococcus neoformans/gattii NOT DETECTED NOT DETECTED Final   CTX-M ESBL NOT DETECTED NOT DETECTED Final   Carbapenem resistance IMP NOT DETECTED NOT DETECTED Final   Carbapenem resistance KPC NOT DETECTED NOT DETECTED Final   Carbapenem resistance NDM NOT DETECTED NOT DETECTED Final   Carbapenem resist OXA 48 LIKE NOT DETECTED NOT DETECTED Final   Carbapenem resistance VIM NOT DETECTED NOT DETECTED Final    Comment: Performed at [facility], [address], [place], [place] [address]  Culture, blood (routine x 2)     Status: None (Preliminary result)   Collection Time: 01/01/22  4:30 PM   Specimen: BLOOD  Result Value Ref Range Status   Specimen Description   Final    BLOOD BLOOD RIGHT WRIST Performed at [facility] [address], [place], [place] [address]    Special Requests   Final    BOTTLES DRAWN AEROBIC AND ANAEROBIC Blood Culture adequate volume Performed at [facility] [address], [place], [place] [address]    Culture  Setup Time   Final    GRAM NEGATIVE RODS ANAEROBIC  BOTTLE ONLY CRITICAL VALUE NOTED.  VALUE IS CONSISTENT WITH PREVIOUSLY REPORTED AND CALLED VALUE. Performed at [facility], [place] [address] [place], [place] [address]    Culture GRAM NEGATIVE RODS  Final   Report Status PENDING  Incomplete  Resp Panel by RT-PCR (Flu A&B, Covid) Anterior Nasal Swab     Status: None   Collection Time: 01/01/22  4:43 PM   Specimen: Anterior Nasal Swab  Result Value Ref Range Status   SARS Coronavirus 2 by RT PCR NEGATIVE NEGATIVE Final    Comment: (NOTE) SARS-CoV-2 target nucleic acids are NOT DETECTED.  The SARS-CoV-2 RNA is generally detectable in upper respiratory specimens during the acute phase of infection. The lowest concentration of SARS-CoV-2 viral copies this assay can detect is 138 copies/mL. A negative result does not preclude SARS-Cov-2 infection and should not be used as the sole basis for treatment or other patient management decisions. A negative result may occur with  improper specimen collection/handling, submission of specimen other than nasopharyngeal swab, presence of viral mutation(s) within the areas targeted by this assay, and inadequate number of viral copies(<138 copies/mL). A negative result must be combined with clinical observations, patient history, and epidemiological information. The expected result is Negative.  Fact Sheet for Patients:  EntrepreneurPulse.com.au  Fact Sheet for Healthcare Providers:  IncredibleEmployment.be  This test is no t yet approved or cleared by the [place] FDA and  has been authorized for detection and/or diagnosis of SARS-CoV-2 by FDA under  an Emergency Use Authorization (EUA). This EUA will remain  in effect (meaning this test can be used) for the duration of the COVID-19 declaration under Section 564(b)(1) of the Act, 21 U.S.C.section 360bbb-3(b)(1), unless the authorization is terminated  or revoked sooner.       Influenza A by PCR NEGATIVE NEGATIVE Final   Influenza B by PCR NEGATIVE NEGATIVE Final    Comment: (NOTE) The Xpert Xpress SARS-CoV-2/FLU/RSV plus assay is intended as an aid in the diagnosis of influenza from Nasopharyngeal swab specimens and should not be used as a sole basis for treatment. Nasal washings and aspirates are unacceptable for Xpert Xpress SARS-CoV-2/FLU/RSV testing.  Fact Sheet for Patients: EntrepreneurPulse.com.au  Fact Sheet for Healthcare Providers: IncredibleEmployment.be  This test is not yet approved or cleared by the [place] FDA and has been authorized for detection and/or diagnosis of SARS-CoV-2 by FDA under an Emergency Use Authorization (EUA). This EUA will remain in effect (meaning this test can be used) for the duration of the COVID-19 declaration under Section 564(b)(1) of the Act, 21 U.S.C. section 360bbb-3(b)(1), unless the authorization is terminated or revoked.  Performed at [facility], [place] [address], [place], [place] [address]       Radiology Studies: CT Abdomen Pelvis W Contrast  Result Date: 01/01/2022 CLINICAL DATA:  Acute abdominal pain. Radiation therapy to bile ducts today. EXAM: CT ABDOMEN AND PELVIS WITH CONTRAST TECHNIQUE: Multidetector CT imaging of the abdomen and pelvis was performed using the standard protocol following bolus administration of intravenous contrast. RADIATION DOSE REDUCTION: This exam was performed according to the departmental dose-optimization program which includes automated exposure control, adjustment of the mA and/or kV according to patient size and/or use of iterative  reconstruction technique. CONTRAST:  163m OMNIPAQUE IOHEXOL 300 MG/ML  SOLN COMPARISON:  MRI abdomen 10/07/2021. FINDINGS: Lower chest: There is atelectasis in the lung bases. Hepatobiliary: Common bile duct stent is in place. There is pneumobilia likely related to the stent. There is gallbladder  wall thickening with mild surrounding inflammation. There are 2 enhancing fluid attenuation areas within the gallbladder fossa. One is likely the gallbladder and other is likely abnormal fluid collection such as abscess or biloma. The larger collection is more posterior measuring 2.0 x 6.3 cm. In the smaller collection is more anterior measuring 3.7 x 1.4 cm. No new focal liver lesions are identified. Pancreas: 1 cm cystic lesion in the pancreatic tail appears unchanged. No pancreatic ductal dilatation or surrounding inflammation. Spleen: Normal in size without focal abnormality. Adrenals/Urinary Tract: Adrenal glands are unremarkable. Kidneys are normal, without renal calculi, focal lesion, or hydronephrosis. Bladder is unremarkable. Stomach/Bowel: Stomach is within normal limits. No evidence of bowel wall thickening, distention, or inflammatory changes. The appendix is not seen. There is sigmoid colon diverticulosis. Vascular/Lymphatic: Aortic atherosclerosis. No enlarged abdominal or pelvic lymph nodes. Reproductive: Prostate is unremarkable. Other: No ascites.  Small fat containing left inguinal hernia. Musculoskeletal: Severe degenerative changes affect the spine. There are moderate degenerative changes of both hips. IMPRESSION: 1. There are 2 enhancing fluid collections in the gallbladder fossa. One is felt to represent gallbladder and the other is likely abnormal fluid collection such as biloma or abscess. These collections demonstrate wall thickening with surrounding inflammation. 2. New common bile duct stent in place with pneumobilia. Electronically Signed   By: Ronney Asters M.D.   On: 01/01/2022 17:43   CT  Head Wo Contrast  Result Date: 01/01/2022 CLINICAL DATA:  Mental status change EXAM: CT HEAD WITHOUT CONTRAST TECHNIQUE: Contiguous axial images were obtained from the base of the skull through the vertex without intravenous contrast. RADIATION DOSE REDUCTION: This exam was performed according to the departmental dose-optimization program which includes automated exposure control, adjustment of the mA and/or kV according to patient size and/or use of iterative reconstruction technique. COMPARISON:  None Available. FINDINGS: Brain: No acute territorial infarction, hemorrhage or intracranial mass. Moderate atrophy. Mild chronic small vessel ischemic changes of the white matter. The ventricles are nonenlarged. Vascular: No hyperdense vessels.  Carotid vascular calcification. Skull: Normal. Negative for fracture or focal lesion. Sinuses/Orbits: No acute finding. Other: None IMPRESSION: 1. No CT evidence for acute intracranial abnormality. 2. Atrophy and mild chronic small vessel ischemic changes of the white matter Electronically Signed   By: Donavan Foil M.D.   On: 01/01/2022 17:32   DG Chest 1 View  Result Date: 01/01/2022 CLINICAL DATA:  Cough. EXAM: CHEST  1 VIEW COMPARISON:  CT of the chest 11/03/2021 FINDINGS: The heart size and mediastinal contours are within normal limits. Both lungs are clear. The visualized skeletal structures are unremarkable. IMPRESSION: No active disease. Electronically Signed   By: Ronney Asters M.D.   On: 01/01/2022 17:27      Scheduled Meds: Continuous Infusions:  cefTRIAXone (ROCEPHIN)  IV     metronidazole 500 mg (01/02/22 0952)     LOS: 1 day     Dessa Phi, DO [facility] 01/02/2022, 11:11 AM   Available via Epic secure chat 7am-7pm After these hours, please refer to coverage provider listed on [email or web address]

## 2022-01-02 NOTE — Progress Notes (Signed)
Our service was notified the patient was hospitalized overnight. 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 He was offered surgery previously with DR. Zenia Resides but declined due to age and concerns of recovering from this. Rather he's being treated with radiotherapy and chemosensitization (Xeloda on hold since 12/12/21 due to rash). He's received 20 of the prescribed 28 fractions to the biliary tree overlapping head of pancreas. Due to his current work up for sepsis and gram negative rod bacteremia, we will hold today's treatment, and if improvement is seen soon resume therapy.      Carola Rhine, PAC

## 2022-01-02 NOTE — Consult Note (Signed)
Chief Complaint: Patient was seen in consultation today for percutaneous cholecystostomy  Referring Physician(s): Maczis,M,PA-C  Supervising Physician: Michaelle Birks  Patient Status: [facility] - In-pt  History of Present Illness: Patrick Collier is an 81 y.o. 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  Patient previously met with [facility] to discuss with surgery but did not wish to pursue this.  He has undergone prior ERCP and stenting in June of this year.  He now presents to [facility] with right upper quadrant abdominal pain, intermittent nausea, weakness, recent confusion, poor appetite, weight loss, recent temperature elevation.  CT head showed no acute intracranial abnormality but atrophy and mild chronic small vessel ischemic changes of white matter.  CT abdomen pelvis performed yesterday revealed:  1. There are 2 enhancing fluid collections in the gallbladder fossa. One is felt to represent gallbladder and the other is likely abnormal fluid collection such as biloma or abscess. These collections demonstrate wall thickening with surrounding inflammation. 2. New common bile duct stent in place with pneumobilia   He is currently afebrile, BP okay, creatinine normal, WBC normal, hemoglobin 11.6, platelets 133k, PT/INR normal; he is currently on Rocephin and Flagyl.  Request now received from surgery for percutaneous cholecystostomy.   Past Medical History:  Diagnosis Date   Arthritis    Back pain    Heart murmur    History of kidney stones    ICH (intracerebral hemorrhage) ([place])     Past Surgical History:  Procedure Laterality Date   BACK SURGERY     BILIARY BRUSHING  10/11/2021   Procedure: BILIARY BRUSHING;  Surgeon: Ladene Artist, MD;  Location: [facility];  Service: Gastroenterology;;   BILIARY BRUSHING   11/21/2021   Procedure: BILIARY BRUSHING;  Surgeon: Clarene Essex, MD;  Location: [facility];  Service: Gastroenterology;;   BILIARY STENT PLACEMENT N/A 10/11/2021   Procedure: BILIARY STENT PLACEMENT;  Surgeon: Ladene Artist, MD;  Location: [facility];  Service: Gastroenterology;  Laterality: N/A;   BILIARY STENT PLACEMENT N/A 11/21/2021   Procedure: BILIARY STENT PLACEMENT;  Surgeon: Clarene Essex, MD;  [facility]: [facility];  Service: Gastroenterology;  Laterality: N/A;   BIOPSY  11/21/2021   Procedure: BIOPSY;  Surgeon: Clarene Essex, MD;  Location: [facility];  Service: Gastroenterology;;   ERCP N/A 10/11/2021   Procedure: ENDOSCOPIC RETROGRADE CHOLANGIOPANCREATOGRAPHY (ERCP);  Surgeon: Ladene Artist, MD;  Location: [facility];  Service: Gastroenterology;  Laterality: N/A;   ERCP N/A 11/21/2021   Procedure: ENDOSCOPIC RETROGRADE CHOLANGIOPANCREATOGRAPHY (ERCP);  Surgeon: Clarene Essex, MD;  Location: [facility];  Service: Gastroenterology;  Laterality: N/A;  with Spyglass   ESOPHAGOGASTRODUODENOSCOPY (EGD) WITH PROPOFOL N/A 10/11/2021   Procedure: ESOPHAGOGASTRODUODENOSCOPY (EGD) WITH PROPOFOL;  Surgeon: Arta Silence, MD;  [facility];  Service: Gastroenterology;  Laterality: N/A;   EUS N/A 10/11/2021   Procedure: UPPER ENDOSCOPIC ULTRASOUND (EUS) LINEAR;  Surgeon: Arta Silence, MD;  Location: [facility];  Service: Gastroenterology;  Laterality: N/A;   FINE NEEDLE ASPIRATION N/A 10/11/2021   Procedure: FINE NEEDLE ASPIRATION (FNA) LINEAR;  Surgeon: Arta Silence, MD;  [facility];  Service: Gastroenterology;  Laterality: N/A;   INGUINAL HERNIA REPAIR     KNEE ARTHROPLASTY Right 05/16/2017   Procedure: RIGHT TOTAL KNEE ARTHROPLASTY WITH COMPUTER NAVIGATION;  Surgeon: Rod Can, MD;  [facility]: [facility];  Service: Orthopedics;  Laterality: Right;  Needs RNFA   NECK SURGERY  SPHINCTEROTOMY  10/11/2021   Procedure: SPHINCTEROTOMY;  Surgeon: Ladene Artist,  MD;  Location: [facility];  Service: Gastroenterology;;   [name]  11/21/2021   Procedure: [name];  Surgeon: Clarene Essex, MD;  Location: [facility];  Service: Gastroenterology;;   [name] CHOLANGIOSCOPY N/A 11/21/2021   Procedure: PZWCHENI CHOLANGIOSCOPY;  Surgeon: Clarene Essex, MD;  [facility];  Service: Gastroenterology;  Laterality: N/A;   STENT REMOVAL  11/21/2021   Procedure: STENT REMOVAL;  Surgeon: Clarene Essex, MD;  Location: [facility];  Service: Gastroenterology;;   TONSILLECTOMY      Allergies: Patient has no known allergies.  Medications: Prior to Admission medications   Medication Sig Start Date End Date Taking? Authorizing Provider  acetaminophen (TYLENOL) 500 MG tablet Take 1,000 mg by mouth daily as needed (pain).   Yes [provider]  lactulose (CHRONULAC) 10 GM/15ML solution Take 30 mLs (20 g total) by mouth 2 (two) times daily as needed for mild constipation or moderate constipation. Patient taking differently: Take by mouth daily. 10/12/21  Yes Mercy Riding, MD  capecitabine (XELODA) 500 MG tablet Take 3 tablets (1,500 mg total) by mouth 2 (two) times daily. Take every 12 hours within 30 minutes after meals. Take only on days of radiation, Monday through Friday. Patient not taking: Reported on 01/02/2022 11/28/21   Truitt Merle, MD  ondansetron (ZOFRAN) 8 MG tablet Take 1 tablet (8 mg total) by mouth every 8 (eight) hours as needed for nausea or vomiting. Patient not taking: Reported on 01/02/2022 12/12/21   Truitt Merle, MD     Family History  Problem Relation Age of Onset   Diabetes Mother     Social History   Socioeconomic History   Marital status: Married    Spouse name: Not on file   Number of children: 3   Years of education: Not on file   Highest education level: Not on file  Occupational History    Comment: Retired  Tobacco Use   Smoking status: Former   Smokeless tobacco: Never  Scientific laboratory technician Use: Never used   Substance and Sexual Activity   Alcohol use: Yes    Comment: one beer per day   Drug use: No   Sexual activity: Not on file  Other Topics Concern   Not on file  Social History Narrative   Not on file   Social Determinants of Health   Financial Resource Strain: Not on file  Food Insecurity: Not on file  Transportation Needs: Not on file  Physical Activity: Not on file  Stress: Not on file  Social Connections: Not on file      Review of Systems see above: denies  headache, chest pain, dyspnea, back pain, nausea, vomiting or bleeding  Vital Signs: BP 113/70 (BP Location: Right Arm)   Pulse (!) 56   Temp 98.4 F (36.9 C) (Oral)   Resp 18   Ht _0  (1.676 m)   Wt 162 lb 11.2 oz (73.8 kg)   SpO2 97%   BMI 26.26 kg/m     Physical Exam awake, answering simple questions okay.  Chest clear to auscultation bilaterally.  Heart with sl bradycardia, reg rhythm, positive murmur.  Abdomen soft, positive bowel sounds, some mild right upper quadrant tenderness to palpation.  No significant lower extremity edema.  Imaging: CT Abdomen Pelvis W Contrast  Result Date: 01/01/2022 CLINICAL DATA:  Acute abdominal pain. Radiation therapy to bile ducts today. EXAM: CT ABDOMEN AND PELVIS WITH CONTRAST TECHNIQUE:  Multidetector CT imaging of the abdomen and pelvis was performed using the standard protocol following bolus administration of intravenous contrast. RADIATION DOSE REDUCTION: This exam was performed according to the departmental dose-optimization program which includes automated exposure control, adjustment of the mA and/or kV according to patient size and/or use of iterative reconstruction technique. CONTRAST:  139m OMNIPAQUE IOHEXOL 300 MG/ML  SOLN COMPARISON:  MRI abdomen 10/07/2021. FINDINGS: Lower chest: There is atelectasis in the lung bases. Hepatobiliary: Common bile duct stent is in place. There is pneumobilia likely related to the stent. There is gallbladder wall thickening with  mild surrounding inflammation. There are 2 enhancing fluid attenuation areas within the gallbladder fossa. One is likely the gallbladder and other is likely abnormal fluid collection such as abscess or biloma. The larger collection is more posterior measuring 2.0 x 6.3 cm. In the smaller collection is more anterior measuring 3.7 x 1.4 cm. No new focal liver lesions are identified. Pancreas: 1 cm cystic lesion in the pancreatic tail appears unchanged. No pancreatic ductal dilatation or surrounding inflammation. Spleen: Normal in size without focal abnormality. Adrenals/Urinary Tract: Adrenal glands are unremarkable. Kidneys are normal, without renal calculi, focal lesion, or hydronephrosis. Bladder is unremarkable. Stomach/Bowel: Stomach is within normal limits. No evidence of bowel wall thickening, distention, or inflammatory changes. The appendix is not seen. There is sigmoid colon diverticulosis. Vascular/Lymphatic: Aortic atherosclerosis. No enlarged abdominal or pelvic lymph nodes. Reproductive: Prostate is unremarkable. Other: No ascites.  Small fat containing left inguinal hernia. Musculoskeletal: Severe degenerative changes affect the spine. There are moderate degenerative changes of both hips. IMPRESSION: 1. There are 2 enhancing fluid collections in the gallbladder fossa. One is felt to represent gallbladder and the other is likely abnormal fluid collection such as biloma or abscess. These collections demonstrate wall thickening with surrounding inflammation. 2. New common bile duct stent in place with pneumobilia. Electronically Signed   By: ARonney AstersM.D.   On: 01/01/2022 17:43   CT Head Wo Contrast  Result Date: 01/01/2022 CLINICAL DATA:  Mental status change EXAM: CT HEAD WITHOUT CONTRAST TECHNIQUE: Contiguous axial images were obtained from the base of the skull through the vertex without intravenous contrast. RADIATION DOSE REDUCTION: This exam was performed according to the departmental  dose-optimization program which includes automated exposure control, adjustment of the mA and/or kV according to patient size and/or use of iterative reconstruction technique. COMPARISON:  None Available. FINDINGS: Brain: No acute territorial infarction, hemorrhage or intracranial mass. Moderate atrophy. Mild chronic small vessel ischemic changes of the white matter. The ventricles are nonenlarged. Vascular: No hyperdense vessels.  Carotid vascular calcification. Skull: Normal. Negative for fracture or focal lesion. Sinuses/Orbits: No acute finding. Other: None IMPRESSION: 1. No CT evidence for acute intracranial abnormality. 2. Atrophy and mild chronic small vessel ischemic changes of the white matter Electronically Signed   By: KDonavan FoilM.D.   On: 01/01/2022 17:32   DG Chest 1 View  Result Date: 01/01/2022 CLINICAL DATA:  Cough. EXAM: CHEST  1 VIEW COMPARISON:  CT of the chest 11/03/2021 FINDINGS: The heart size and mediastinal contours are within normal limits. Both lungs are clear. The visualized skeletal structures are unremarkable. IMPRESSION: No active disease. Electronically Signed   By: ARonney AstersM.D.   On: 01/01/2022 17:27    Labs:  CBC: Recent Labs    12/18/21 1228 01/01/22 1230 01/01/22 1621 01/02/22 0450  WBC 6.7 6.2 9.7 6.7  HGB 12.0* 13.2 11.5* 11.6*  HCT 35.2* 39.7 34.0* 35.5*  PLT  295 170 144* 133*    COAGS: Recent Labs    10/06/21 2008 10/11/21 0505 10/12/21 0443 01/01/22 1621  INR 1.2 1.6* 1.5* 1.1    BMP: Recent Labs    12/18/21 1228 01/01/22 1230 01/01/22 1621 01/02/22 0450  NA 136 135 136 135  K 3.8 3.7 3.3* 4.3  CL 100 99 101 102  CO2 33* _0 GLUCOSE 121* 119* 124* 104*  BUN _1 CALCIUM 8.9 9.2 8.1* 8.3*  CREATININE 0.73 0.87 0.73 0.75  GFRNONAA >60 >60 >60 >60    LIVER FUNCTION TESTS: Recent Labs    12/12/21 1224 12/18/21 1228 01/01/22 1230 01/01/22 1621  BILITOT 1.0 0.7 0.9 1.0  AST _2 ALT _3 ALKPHOS 86 86 84 74  PROT 6.9 6.6 7.6 6.6  ALBUMIN 3.3* 3.1* 3.6 2.8*    TUMOR MARKERS: No results for input(s): "AFPTM", "CEA", "CA199", "CHROMGRNA" in the last 8760 hours.  Assessment and Plan: 81 y.o. 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  Patient previously met with [facility] to discuss with surgery but did not wish to pursue this.  He has undergone prior ERCP and stenting in June of this year.  He now presents to [facility] with right upper quadrant abdominal pain, intermittent nausea, weakness, recent confusion, poor appetite, weight loss, recent temperature elevation.  CT head showed no acute intracranial abnormality but atrophy and mild chronic small vessel ischemic changes of white matter.  CT abdomen pelvis performed yesterday revealed:  1. There are 2 enhancing fluid collections in the gallbladder fossa. One is felt to represent gallbladder and the other is likely abnormal fluid collection such as biloma or abscess. These collections demonstrate wall thickening with surrounding inflammation. 2. New common bile duct stent in place with pneumobilia   He is currently afebrile, BP okay, creatinine normal, WBC normal, hemoglobin 11.6, platelets 133k, PT/INR normal; he is currently on Rocephin and Flagyl.  Request now received from surgery for percutaneous cholecystostomy.  Imaging studies have been reviewed by Dr.Mugweru.  Details/risks of procedure, including but not limited to, internal bleeding, infection, injury to adjacent structures, potential for more than 1 drain placement discussed with patient, spouse, daughter with their understanding and consent.  Procedure scheduled for this afternoon.   Thank you for this interesting consult.  I greatly enjoyed meeting Patrick Collier and look forward to participating in their  care.  A copy of this report was sent to the requesting provider on this date.  Electronically Signed: D. Rowe Robert, PA-C 01/02/2022, 12:21 PM   I spent a total of  25 minutes   in face to face in clinical consultation, greater than 50% of which was counseling/coordinating care for percutaneous cholecystostomy

## 2022-01-02 NOTE — Progress Notes (Signed)
Mobility Specialist - Progress Note   01/02/22 1334  Mobility  Activity Ambulated independently in hallway  Range of Motion/Exercises Active  Level of Assistance Independent  Assistive Device None  Distance Ambulated (ft) 500 ft  Activity Response Tolerated well  $Mobility charge 1 Mobility   Pt was found in bed and agreeable to mobilize. Had no complaints and was left with all necessities in reach.  Ferd Hibbs Mobility Specialist

## 2022-01-02 NOTE — Procedures (Signed)
Vascular and Interventional Radiology Procedure Note  Patient: Patrick Collier DOB: May 15, 1940 Medical Record Number: [id number] Note Date/Time: 01/02/22 3:41 PM   Performing Physician: Michaelle Birks, MD Assistant(s): None  Diagnosis: Extrahepatic cholangioCA. Q GB perforation.  Procedure:  CHOLECYSTOSTOMY TUBE PLACEMENT ANTEROGRADE CHOLANGIOGRAM  Anesthesia: Conscious Sedation Complications: None Estimated Blood Loss: Minimal Specimens:  None  Findings:  Successful placement of 60F cholecystostomy tube.  Plan: Flush tube w 5 mL sterile NS and record drain output q8hrs Follow up for routine tube evaluation in 8 week(s).   See detailed procedure note with images in PACS. The patient tolerated the procedure well without incident or complication and was returned to Floor Bed in stable condition.    Michaelle Birks, MD Vascular and Interventional Radiology Specialists [facility] Radiology   Pager. [place]

## 2022-01-02 NOTE — Progress Notes (Signed)
Mobility Specialist - Progress Note   01/02/22 0925  Mobility  HOB Elevated/Bed Position Self regulated  Activity Ambulated independently in hallway  Range of Motion/Exercises Active  Level of Assistance Independent  Assistive Device None  Distance Ambulated (ft) 500 ft  Activity Response Tolerated well  $Mobility charge 1 Mobility   Pt was found in bed and agreeable to mobilize. Had no complaints and returned to bed with all necessities in reach.  Ferd Hibbs Mobility Specialist

## 2022-01-03 ENCOUNTER — Ambulatory Visit: Payer: Medicare Other

## 2022-01-03 DIAGNOSIS — K810 Acute cholecystitis: Secondary | ICD-10-CM

## 2022-01-03 LAB — COMPREHENSIVE METABOLIC PANEL
ALT: 16 U/L (ref 0–44)
AST: 25 U/L (ref 15–41)
Albumin: 2.5 g/dL — ABNORMAL LOW (ref 3.5–5.0)
Alkaline Phosphatase: 65 U/L (ref 38–126)
Anion gap: 6 (ref 5–15)
BUN: 11 mg/dL (ref 8–23)
CO2: 27 mmol/L (ref 22–32)
Calcium: 8 mg/dL — ABNORMAL LOW (ref 8.9–10.3)
Chloride: 102 mmol/L (ref 98–111)
Creatinine, Ser: 0.78 mg/dL (ref 0.61–1.24)
GFR, Estimated: >60 mL/min (ref >60)
Glucose, Bld: 117 mg/dL — ABNORMAL HIGH (ref 70–99)
Potassium: 3.6 mmol/L (ref 3.5–5.1)
Sodium: 135 mmol/L (ref 135–145)
Total Bilirubin: 0.7 mg/dL (ref 0.3–1.2)
Total Protein: 6.1 g/dL — ABNORMAL LOW (ref 6.5–8.1)

## 2022-01-03 LAB — CBC
HCT: 34.6 % — ABNORMAL LOW (ref 39.0–52.0)
Hemoglobin: 11.5 g/dL — ABNORMAL LOW (ref 13.0–17.0)
MCH: 32.5 pg (ref 26.0–34.0)
MCHC: 33.2 g/dL (ref 30.0–36.0)
MCV: 97.7 fL (ref 80.0–100.0)
Platelets: 147 10*3/uL — ABNORMAL LOW (ref 150–400)
RBC: 3.54 MIL/uL — ABNORMAL LOW (ref 4.22–5.81)
RDW: 13.8 % (ref 11.5–15.5)
WBC: 8.3 10*3/uL (ref 4.0–10.5)
nRBC: 0 % (ref 0.0–0.2)

## 2022-01-03 MED ORDER — AMOXICILLIN-POT CLAVULANATE 875-125 MG PO TABS
1.0000 | ORAL_TABLET | Freq: Two times a day (BID) | ORAL | Status: DC
  Administered 2022-01-03 – 2022-01-04 (×3): 1 via ORAL
  Filled 2022-01-03 (×3): qty 1

## 2022-01-03 NOTE — TOC Initial Note (Signed)
Transition of Care [facility]) - Initial/Assessment Note    Patient Details  Name: Patrick Collier MRN: [id number] Date of Birth: 01-26-1941  Transition of Care [facility]) CM/SW Contact:    Vassie Moselle, LCSW Phone Number: 01/03/2022, 12:42 PM  Clinical Narrative:                 [facility] and aide arranged w/ [facility]. Will need orders placed prior to discharge.  Expected Discharge Plan: [place] Barriers to Discharge: No Barriers Identified   Patient Goals and CMS Choice Patient states their goals for this hospitalization and ongoing recovery are:: To return home   Choice offered to / list presented to : Patient, Spouse  Expected Discharge Plan and Services Expected Discharge Plan: [place] In-house Referral: NA Discharge Planning Services: CM Consult Post Acute Care Choice: [place] arrangements for the past 2 months: Single Family Home                 DME Arranged: N/A DME Agency: NA       HH Arranged: RN, Nurse's Aide HH Agency: [place] Date [facility] Agency Contacted: 01/03/22 Time [place]: 1241 Representative spoke with at [place]: Georgina Snell  Prior Living Arrangements/Services Living arrangements for the past 2 months: [place] Lives with:: Spouse Patient language and need for interpreter reviewed:: Yes Do you feel safe going back to the place where you live?: Yes      Need for Family Participation in Patient Care: No (Comment) Care giver support system in place?: No (comment) Current home services: DME Criminal Activity/Legal Involvement Pertinent to Current Situation/Hospitalization: No - Comment as needed  Activities of Daily Living Home Assistive Devices/Equipment: Eyeglasses ADL Screening (condition at time of admission) Patient's cognitive ability adequate to safely complete daily activities?: No Is the patient deaf or have difficulty hearing?: No Does the patient have  difficulty seeing, even when wearing glasses/contacts?: No Does the patient have difficulty concentrating, remembering, or making decisions?: Yes Patient able to express need for assistance with ADLs?: Yes Does the patient have difficulty dressing or bathing?: Yes Independently performs ADLs?: No Communication: Independent Dressing (OT): Needs assistance Is this a change from baseline?: Change from baseline, expected to last >3 days Grooming: Needs assistance Is this a change from baseline?: Change from baseline, expected to last >3 days Feeding: Needs assistance Is this a change from baseline?: Change from baseline, expected to last >3 days Bathing: Needs assistance Is this a change from baseline?: Change from baseline, expected to last >3 days Toileting: Needs assistance Is this a change from baseline?: Change from baseline, expected to last >3days In/Out Bed: Needs assistance Is this a change from baseline?: Change from baseline, expected to last >3 days Walks in Home: Needs assistance Is this a change from baseline?: Change from baseline, expected to last >3 days Does the patient have difficulty walking or climbing stairs?: Yes Weakness of Legs: Both Weakness of Arms/Hands: Both  Permission Sought/Granted Permission sought to share information with : Case Manager, Family Supports Permission granted to share information with : Yes, Verbal Permission Granted  Share Information with NAME: Patrick Collier     Permission granted to share info w Relationship: Spouse  Permission granted to share info w Contact Information: [phone]  Emotional Assessment Appearance:: Appears stated age Attitude/Demeanor/Rapport: Engaged Affect (typically observed): Accepting Orientation: : Oriented to Self, Oriented to Place, Oriented to  Time, Oriented to Situation Alcohol /  Substance Use: Not Applicable Psych Involvement: No (comment)  Admission diagnosis:  Acute cholecystitis [K81.0] Abscess  of gallbladder [K81.0] Sepsis without acute organ dysfunction, due to unspecified organism [facility]) [A41.9] Patient Active Problem List   Diagnosis Date Noted   Bacteremia 01/02/2022   Acute cholecystitis 37/62/8315   Acute metabolic encephalopathy 17/61/6073   Thrombocytopenia ([place]) 01/01/2022   Cholangiocarcinoma ([place]) 10/26/2021   Sinus bradycardia 10/11/2021   Jaundice    Abnormal magnetic resonance cholangiopancreatography (MRCP)    Common bile duct (CBD) stricture    Hyperbilirubinemia 10/10/2021   Elevated CA 19-9 level 10/10/2021   Hyperammonemia (H[facility]) 10/10/2021   Moderate aortic stenosis 10/10/2021   Lower back rash 10/10/2021   Obstructive jaundice 10/06/2021   Hypokalemia 10/06/2021   Hyperglycemia 10/06/2021   Osteoarthritis of right knee 05/16/2017   PCP:  London Pepper, MD Pharmacy:   [id number] [id number]: [phone]: [address]Phone: [phone]: [phone]    Social Determinants of Health (SDOH) Interventions    Readmission Risk Interventions     No data to display

## 2022-01-03 NOTE — Social Work (Signed)
CSW reached out to [facility], asked provider for orders for Home Health Aid, as well RN. CSW spoke to the Pt and his wife as they are both in agreement to the services.

## 2022-01-03 NOTE — Progress Notes (Addendum)
Patient ID: Patrick Collier, male   DOB: [birth year], 81 y.o.   MRN: [id number]    Referring Physician(s): White,C  Supervising Physician: Mir, Biochemist, clinical  Patient Status:  [name] - In-pt  Chief Complaint:  Recent abdominal pain, extrahepatic cholangiocarcinoma,cholecystitis/pericholecystic abscess  Subjective: Pt doing well this am; denies fever/chills/worsening abd pain,N/V   Allergies: Patient has no known allergies.  Medications: Prior to Admission medications   Medication Sig Start Date End Date Taking? Authorizing Provider  acetaminophen (TYLENOL) 500 MG tablet Take 1,000 mg by mouth daily as needed (pain).   Yes [provider]  lactulose (CHRONULAC) 10 GM/15ML solution Take 30 mLs (20 g total) by mouth 2 (two) times daily as needed for mild constipation or moderate constipation. Patient taking differently: Take by mouth daily. 10/12/21  Yes Mercy Riding, MD  capecitabine (XELODA) 500 MG tablet Take 3 tablets (1,500 mg total) by mouth 2 (two) times daily. Take every 12 hours within 30 minutes after meals. Take only on days of radiation, Monday through Friday. Patient not taking: Reported on 01/02/2022 11/28/21   Truitt Merle, MD  ondansetron (ZOFRAN) 8 MG tablet Take 1 tablet (8 mg total) by mouth every 8 (eight) hours as needed for nausea or vomiting. Patient not taking: Reported on 01/02/2022 12/12/21   Truitt Merle, MD     Vital Signs: BP 134/75 (BP Location: Right Arm)   Pulse 68   Temp 98.7 F (37.1 C) (Oral)   Resp 17   Ht '5\' 6"'$  (1.676 m)   Wt 162 lb 11.2 oz (73.8 kg)   SpO2 98%   BMI 26.26 kg/m   Physical Exam;awake/answers questions ok; RUQ drain intact, insertion site ok, not sig tender, OP 125 cc serosang bile; drain flushed without difficulty  Imaging: IR Perc Cholecystostomy  Result Date: 01/02/2022 INDICATION: Extrahepatic cholangioCA. Q GB perforation. EXAM: ULTRASOUND AND FLUOROSCOPIC-GUIDED CHOLECYSTOSTOMY TUBE PLACEMENT COMPARISON:  CT AP, 01/03/2022.  MEDICATIONS: The patient is currently admitted to the hospital and on intravenous antibiotics. Antibiotics were administered within an appropriate time frame prior to skin puncture. ANESTHESIA/SEDATION: Moderate (conscious) sedation was employed during this procedure. A total of Versed 2 mg and Fentanyl 200 mcg was administered intravenously. Moderate Sedation Time: 25 minutes. The patient's level of consciousness and vital signs were monitored continuously by radiology nursing throughout the procedure under my direct supervision. CONTRAST:  32m OMNIPAQUE IOHEXOL 300 MG/ML SOLN - administered into the gallbladder fossa. FLUOROSCOPY TIME:  Fluoroscopic dose; 9 mGy COMPLICATIONS: None immediate. PROCEDURE: Informed written consent was obtained from the the patient and/or patient's representative after a discussion of the risks, benefits and alternatives to treatment. Questions regarding the procedure were encouraged and answered. A timeout was performed prior to the initiation of the procedure. The RIGHT upper abdominal quadrant was prepped and draped in the usual sterile fashion, and a sterile drape was applied covering the operative field. Maximum barrier sterile technique with sterile gowns and gloves were used for the procedure. A timeout was performed prior to the initiation of the procedure. Local anesthesia was provided with 1% lidocaine with epinephrine. Ultrasound scanning of the right upper quadrant demonstrates a markedly dilated gallbladder. Utilizing a transhepatic approach, a 22 gauge needle was advanced into the gallbladder under direct ultrasound guidance. An ultrasound image was saved for documentation purposes. Appropriate intraluminal puncture was confirmed with the efflux of bile and advancement of an 0.018 wire into the gallbladder lumen. The needle was exchanged for an AMaalaeaset. A small amount of contrast was injected  to confirm appropriate intraluminal positioning. Over a short Amplatz  wire, a 10 Fr cholecystomy tube was advanced into the gallbladder fossa, coiled and locked. Bile was aspirated and a small amount of contrast was injected as several post procedural spot radiographic images were obtained in various obliquities. The catheter was secured to the skin with suture, connected to a drainage bag and a dressing was placed. The patient tolerated the procedure well without immediate post procedural complication. IMPRESSION: Successful placement of a 10 Fr cholecystostomy drainage tube, as above. PLAN: The patient will return to Vascular Interventional Radiology (VIR) for routine drainage catheter evaluation and exchange in 8 weeks. Michaelle Birks, MD [facility] Radiology Electronically Signed   By: Michaelle Birks M.D.   On: 01/02/2022 20:29   CT Abdomen Pelvis W Contrast  Result Date: 01/01/2022 CLINICAL DATA:  Acute abdominal pain. Radiation therapy to bile ducts today. EXAM: CT ABDOMEN AND PELVIS WITH CONTRAST TECHNIQUE: Multidetector CT imaging of the abdomen and pelvis was performed using the standard protocol following bolus administration of intravenous contrast. RADIATION DOSE REDUCTION: This exam was performed according to the departmental dose-optimization program which includes automated exposure control, adjustment of the mA and/or kV according to patient size and/or use of iterative reconstruction technique. CONTRAST:  126m OMNIPAQUE IOHEXOL 300 MG/ML  SOLN COMPARISON:  MRI abdomen 10/07/2021. FINDINGS: Lower chest: There is atelectasis in the lung bases. Hepatobiliary: Common bile duct stent is in place. There is pneumobilia likely related to the stent. There is gallbladder wall thickening with mild surrounding inflammation. There are 2 enhancing fluid attenuation areas within the gallbladder fossa. One is likely the gallbladder and other is likely abnormal fluid collection such as abscess or biloma. The larger collection is more  posterior measuring 2.0 x 6.3 cm. In the smaller collection is more anterior measuring 3.7 x 1.4 cm. No new focal liver lesions are identified. Pancreas: 1 cm cystic lesion in the pancreatic tail appears unchanged. No pancreatic ductal dilatation or surrounding inflammation. Spleen: Normal in size without focal abnormality. Adrenals/Urinary Tract: Adrenal glands are unremarkable. Kidneys are normal, without renal calculi, focal lesion, or hydronephrosis. Bladder is unremarkable. Stomach/Bowel: Stomach is within normal limits. No evidence of bowel wall thickening, distention, or inflammatory changes. The appendix is not seen. There is sigmoid colon diverticulosis. Vascular/Lymphatic: Aortic atherosclerosis. No enlarged abdominal or pelvic lymph nodes. Reproductive: Prostate is unremarkable. Other: No ascites.  Small fat containing left inguinal hernia. Musculoskeletal: Severe degenerative changes affect the spine. There are moderate degenerative changes of both hips. IMPRESSION: 1. There are 2 enhancing fluid collections in the gallbladder fossa. One is felt to represent gallbladder and the other is likely abnormal fluid collection such as biloma or abscess. These collections demonstrate wall thickening with surrounding inflammation. 2. New common bile duct stent in place with pneumobilia. Electronically Signed   By: ARonney AstersM.D.   On: 01/01/2022 17:43   CT Head Wo Contrast  Result Date: 01/01/2022 CLINICAL DATA:  Mental status change EXAM: CT HEAD WITHOUT CONTRAST TECHNIQUE: Contiguous axial images were obtained from the base of the skull through the vertex without intravenous contrast. RADIATION DOSE REDUCTION: This exam was performed according to the departmental dose-optimization program which includes automated exposure control, adjustment of the mA and/or kV according to patient size and/or use of iterative reconstruction technique. COMPARISON:  None Available. FINDINGS: Brain: No acute territorial  infarction, hemorrhage or intracranial mass. Moderate atrophy. Mild chronic small vessel ischemic changes of the white matter. The  ventricles are nonenlarged. Vascular: No hyperdense vessels.  Carotid vascular calcification. Skull: Normal. Negative for fracture or focal lesion. Sinuses/Orbits: No acute finding. Other: None IMPRESSION: 1. No CT evidence for acute intracranial abnormality. 2. Atrophy and mild chronic small vessel ischemic changes of the white matter Electronically Signed   By: Donavan Foil M.D.   On: 01/01/2022 17:32   DG Chest 1 View  Result Date: 01/01/2022 CLINICAL DATA:  Cough. EXAM: CHEST  1 VIEW COMPARISON:  CT of the chest 11/03/2021 FINDINGS: The heart size and mediastinal contours are within normal limits. Both lungs are clear. The visualized skeletal structures are unremarkable. IMPRESSION: No active disease. Electronically Signed   By: Ronney Asters M.D.   On: 01/01/2022 17:27    Labs:  CBC: Recent Labs    01/01/22 1230 01/01/22 1621 01/02/22 0450 01/03/22 0437  WBC 6.2 9.7 6.7 8.3  HGB 13.2 11.5* 11.6* 11.5*  HCT 39.7 34.0* 35.5* 34.6*  PLT 170 144* 133* 147*    COAGS: Recent Labs    10/06/21 2008 10/11/21 0505 10/12/21 0443 01/01/22 1621  INR 1.2 1.6* 1.5* 1.1    BMP: Recent Labs    01/01/22 1230 01/01/22 1621 01/02/22 0450 01/03/22 0437  NA 135 136 135 135  K 3.7 3.3* 4.3 3.6  CL 99 101 102 102  CO2 '31 26 28 27  '$ GLUCOSE 119* 124* 104* 117*  BUN '10 10 10 11  '$ CALCIUM 9.2 8.1* 8.3* 8.0*  CREATININE 0.87 0.73 0.75 0.78  GFRNONAA >60 >60 >60 >60    LIVER FUNCTION TESTS: Recent Labs    12/18/21 1228 01/01/22 1230 01/01/22 1621 01/03/22 0437  BILITOT 0.7 0.9 1.0 0.7  AST '18 20 20 25  '$ ALT '11 11 12 16  '$ ALKPHOS 86 84 74 65  PROT 6.6 7.6 6.6 6.1*  ALBUMIN 3.1* 3.6 2.8* 2.5*    Assessment and Plan: Pt with hx extrahepatic cholangiocarcinoma; now with GB wall thickening/inflammation, pericholecystic fluid coll/? abscess; s/p perc  cholecystostomy 9/12; afebrile,WBC nl, hgb stable, creat nl, bile cx pend   Drain Location: RUQ Size: Fr size: 10 Fr Date of placement: 01/02/22  Currently to: Drain collection device: gravity 24 hour output:  Output by Drain (mL) 01/01/22 0701 - 01/01/22 1900 01/01/22 1901 - 01/02/22 0700 01/02/22 0701 - 01/02/22 1900 01/02/22 1901 - 01/03/22 0700 01/03/22 0701 - 01/03/22 1032  Biliary Tube Cook slip-coat 10.2 Fr. RUQ   30 95       Current examination: Flushes/aspirates easily.  Insertion site unremarkable. Suture  in place. Dressed appropriately.   Plan: Continue TID flushes with 5 cc NS. Record output Q shift. Dressing changes QD or PRN if soiled.  Call IR APP or on call IR MD if difficulty flushing or sudden change in drain output.   Discharge planning: Please contact IR APP or on call IR MD prior to patient d/c to ensure appropriate follow up plans are in place.  Patient will need to flush drain QD with 5 cc NS, record output QD, dressing changes every 2-3 days or earlier if soiled.   IR will continue to follow - please call with questions or concerns.  Order has been placed for routine GB drain exchange in 6-8 weeks; if pt's clinical status worsens recommend obtaining f/u CT  Other plans as per [facility]  Electronically Signed: D. Rowe Robert, PA-C 01/03/2022, 10:11 AM   I spent a total of 15 Minutes at the the patient's bedside AND on the patient's hospital floor or  unit, greater than 50% of which was counseling/coordinating care for gallbladder drain

## 2022-01-03 NOTE — Progress Notes (Signed)
Subjective: CC: S/p Perc Chole by IR yesterday. IR Q GB perforation. He denies any abdominal pain, n/v. Tolerating solid diet this am. Drain SS.   Objective: Vital signs in last 24 hours: Temp:  [97.5 F (36.4 C)-98.7 F (37.1 C)] 98.7 F (37.1 C) (09/13 0530) Pulse Rate:  [56-68] 68 (09/13 0530) Resp:  [11-20] 17 (09/13 0530) BP: (113-141)/(61-78) 134/75 (09/13 0530) SpO2:  [97 %-100 %] 98 % (09/13 0530) Last BM Date : 12/31/21  Intake/Output from previous day: 09/12 0701 - 09/13 0700 In: 1150 [P.O.:840; IV Piggyback:300] Out: 925 [Urine:800; Drains:125] Intake/Output this shift: No intake/output data recorded.  PE: Gen:  Alert, NAD, pleasant Abd: Soft, ND, NT, +BS, IR perc chole with SS like output.   Lab Results:  Recent Labs    01/02/22 0450 01/03/22 0437  WBC 6.7 8.3  HGB 11.6* 11.5*  HCT 35.5* 34.6*  PLT 133* 147*   BMET Recent Labs    01/02/22 0450 01/03/22 0437  NA 135 135  K 4.3 3.6  CL 102 102  CO2 28 27  GLUCOSE 104* 117*  BUN 10 11  CREATININE 0.75 0.78  CALCIUM 8.3* 8.0*   PT/INR Recent Labs    01/01/22 1621  LABPROT 14.3  INR 1.1   CMP     Component Value Date/Time   NA 135 01/03/2022 0437   K 3.6 01/03/2022 0437   CL 102 01/03/2022 0437   CO2 27 01/03/2022 0437   GLUCOSE 117 (H) 01/03/2022 0437   BUN 11 01/03/2022 0437   CREATININE 0.78 01/03/2022 0437   CREATININE 0.91 11/27/2021 1313   CALCIUM 8.0 (L) 01/03/2022 0437   PROT 6.1 (L) 01/03/2022 0437   ALBUMIN 2.5 (L) 01/03/2022 0437   AST 25 01/03/2022 0437   AST 21 11/27/2021 1313   ALT 16 01/03/2022 0437   ALT 19 11/27/2021 1313   ALKPHOS 65 01/03/2022 0437   BILITOT 0.7 01/03/2022 0437   BILITOT 1.3 (H) 11/27/2021 1313   GFRNONAA >60 01/03/2022 0437   GFRNONAA >60 11/27/2021 1313   GFRAA >60 05/17/2017 0526   Lipase     Component Value Date/Time   LIPASE 26 01/01/2022 1621    Studies/Results: IR Perc Cholecystostomy  Result Date:  01/02/2022 INDICATION: Extrahepatic cholangioCA. Q GB perforation. EXAM: ULTRASOUND AND FLUOROSCOPIC-GUIDED CHOLECYSTOSTOMY TUBE PLACEMENT COMPARISON:  CT AP, 01/03/2022. MEDICATIONS: The patient is currently admitted to the hospital and on intravenous antibiotics. Antibiotics were administered within an appropriate time frame prior to skin puncture. ANESTHESIA/SEDATION: Moderate (conscious) sedation was employed during this procedure. A total of Versed 2 mg and Fentanyl 200 mcg was administered intravenously. Moderate Sedation Time: 25 minutes. The patient's level of consciousness and vital signs were monitored continuously by radiology nursing throughout the procedure under my direct supervision. CONTRAST:  23m OMNIPAQUE IOHEXOL 300 MG/ML SOLN - administered into the gallbladder fossa. FLUOROSCOPY TIME:  Fluoroscopic dose; 9 mGy COMPLICATIONS: None immediate. PROCEDURE: Informed written consent was obtained from the the patient and/or patient's representative after a discussion of the risks, benefits and alternatives to treatment. Questions regarding the procedure were encouraged and answered. A timeout was performed prior to the initiation of the procedure. The RIGHT upper abdominal quadrant was prepped and draped in the usual sterile fashion, and a sterile drape was applied covering the operative field. Maximum barrier sterile technique with sterile gowns and gloves were used for the procedure. A timeout was performed prior to the initiation of the procedure. Local anesthesia was  provided with 1% lidocaine with epinephrine. Ultrasound scanning of the right upper quadrant demonstrates a markedly dilated gallbladder. Utilizing a transhepatic approach, a 22 gauge needle was advanced into the gallbladder under direct ultrasound guidance. An ultrasound image was saved for documentation purposes. Appropriate intraluminal puncture was confirmed with the efflux of bile and advancement of an 0.018 wire into the  gallbladder lumen. The needle was exchanged for an Clinton set. A small amount of contrast was injected to confirm appropriate intraluminal positioning. Over a short Amplatz wire, a 10 Fr cholecystomy tube was advanced into the gallbladder fossa, coiled and locked. Bile was aspirated and a small amount of contrast was injected as several post procedural spot radiographic images were obtained in various obliquities. The catheter was secured to the skin with suture, connected to a drainage bag and a dressing was placed. The patient tolerated the procedure well without immediate post procedural complication. IMPRESSION: Successful placement of a 10 Fr cholecystostomy drainage tube, as above. PLAN: The patient will return to Vascular Interventional Radiology (VIR) for routine drainage catheter evaluation and exchange in 8 weeks. Michaelle Birks, MD [facility] Radiology Electronically Signed   By: Michaelle Birks M.D.   On: 01/02/2022 20:29   CT Abdomen Pelvis W Contrast  Result Date: 01/01/2022 CLINICAL DATA:  Acute abdominal pain. Radiation therapy to bile ducts today. EXAM: CT ABDOMEN AND PELVIS WITH CONTRAST TECHNIQUE: Multidetector CT imaging of the abdomen and pelvis was performed using the standard protocol following bolus administration of intravenous contrast. RADIATION DOSE REDUCTION: This exam was performed according to the departmental dose-optimization program which includes automated exposure control, adjustment of the mA and/or kV according to patient size and/or use of iterative reconstruction technique. CONTRAST:  171m OMNIPAQUE IOHEXOL 300 MG/ML  SOLN COMPARISON:  MRI abdomen 10/07/2021. FINDINGS: Lower chest: There is atelectasis in the lung bases. Hepatobiliary: Common bile duct stent is in place. There is pneumobilia likely related to the stent. There is gallbladder wall thickening with mild surrounding inflammation. There are 2 enhancing fluid  attenuation areas within the gallbladder fossa. One is likely the gallbladder and other is likely abnormal fluid collection such as abscess or biloma. The larger collection is more posterior measuring 2.0 x 6.3 cm. In the smaller collection is more anterior measuring 3.7 x 1.4 cm. No new focal liver lesions are identified. Pancreas: 1 cm cystic lesion in the pancreatic tail appears unchanged. No pancreatic ductal dilatation or surrounding inflammation. Spleen: Normal in size without focal abnormality. Adrenals/Urinary Tract: Adrenal glands are unremarkable. Kidneys are normal, without renal calculi, focal lesion, or hydronephrosis. Bladder is unremarkable. Stomach/Bowel: Stomach is within normal limits. No evidence of bowel wall thickening, distention, or inflammatory changes. The appendix is not seen. There is sigmoid colon diverticulosis. Vascular/Lymphatic: Aortic atherosclerosis. No enlarged abdominal or pelvic lymph nodes. Reproductive: Prostate is unremarkable. Other: No ascites.  Small fat containing left inguinal hernia. Musculoskeletal: Severe degenerative changes affect the spine. There are moderate degenerative changes of both hips. IMPRESSION: 1. There are 2 enhancing fluid collections in the gallbladder fossa. One is felt to represent gallbladder and the other is likely abnormal fluid collection such as biloma or abscess. These collections demonstrate wall thickening with surrounding inflammation. 2. New common bile duct stent in place with pneumobilia. Electronically Signed   By: ARonney AstersM.D.   On: 01/01/2022 17:43   CT Head Wo Contrast  Result Date: 01/01/2022 CLINICAL DATA:  Mental status change EXAM: CT HEAD WITHOUT CONTRAST TECHNIQUE: Contiguous  axial images were obtained from the base of the skull through the vertex without intravenous contrast. RADIATION DOSE REDUCTION: This exam was performed according to the departmental dose-optimization program which includes automated exposure  control, adjustment of the mA and/or kV according to patient size and/or use of iterative reconstruction technique. COMPARISON:  None Available. FINDINGS: Brain: No acute territorial infarction, hemorrhage or intracranial mass. Moderate atrophy. Mild chronic small vessel ischemic changes of the white matter. The ventricles are nonenlarged. Vascular: No hyperdense vessels.  Carotid vascular calcification. Skull: Normal. Negative for fracture or focal lesion. Sinuses/Orbits: No acute finding. Other: None IMPRESSION: 1. No CT evidence for acute intracranial abnormality. 2. Atrophy and mild chronic small vessel ischemic changes of the white matter Electronically Signed   By: Donavan Foil M.D.   On: 01/01/2022 17:32   DG Chest 1 View  Result Date: 01/01/2022 CLINICAL DATA:  Cough. EXAM: CHEST  1 VIEW COMPARISON:  CT of the chest 11/03/2021 FINDINGS: The heart size and mediastinal contours are within normal limits. Both lungs are clear. The visualized skeletal structures are unremarkable. IMPRESSION: No active disease. Electronically Signed   By: Ronney Asters M.D.   On: 01/01/2022 17:27    Anti-infectives: Anti-infectives (From admission, onward)    Start     Dose/Rate Route Frequency Ordered Stop   01/02/22 1600  cefTRIAXone (ROCEPHIN) 2 g in sodium chloride 0.9 % 100 mL IVPB        2 g 200 mL/hr over 30 Minutes Intravenous Every 24 hours 01/01/22 2131     01/02/22 0800  metroNIDAZOLE (FLAGYL) IVPB 500 mg        500 mg 100 mL/hr over 60 Minutes Intravenous Every 12 hours 01/01/22 2131     01/01/22 1815  metroNIDAZOLE (FLAGYL) IVPB 500 mg        500 mg 100 mL/hr over 60 Minutes Intravenous  Once 01/01/22 1807 01/01/22 2159   01/01/22 1600  cefTRIAXone (ROCEPHIN) 1 g in sodium chloride 0.9 % 100 mL IVPB  Status:  Discontinued        1 g 200 mL/hr over 30 Minutes Intravenous  Once 01/01/22 1546 01/01/22 1549   01/01/22 1600  cefTRIAXone (ROCEPHIN) 2 g in sodium chloride 0.9 % 100 mL IVPB        2  g 200 mL/hr over 30 Minutes Intravenous  Once 01/01/22 1549 01/01/22 1812   01/01/22 1600  vancomycin (VANCOREADY) IVPB 1500 mg/300 mL        1,500 mg 150 mL/hr over 120 Minutes Intravenous  Once 01/01/22 1550 01/01/22 2015        Assessment/Plan Acute Cholecystitis now s/p Perc Chole on 9/12 by IR + Bcx with Enterobacterales and Klebsiella detected 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 Burr Medico. Previously saw Dr. Zenia Resides in the office and discussed Whipple surgery but did not wish to pursue this - Symptomatically improved. No abdominal pain/ttp today. Tolerating diet. Labs also reassuring with normal wbc (although never amounted a leukocytosis) and normal LFT's.  - Cont abx. Follow cx's - Will need drain teaching and follow up with IR and Dr. Zenia Resides in the office.   FEN - Reg, IVF per TRH VTE - SCDs, okay for chemical ppx from a general surgery standpoint ID - Rocephin/Flagyl   LOS: 2 days    Jillyn Ledger , [facility] 01/03/2022, 8:37 AM Please see  Amion for pager number during day hours 7:00am-4:30pm

## 2022-01-03 NOTE — Progress Notes (Signed)
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  He has been on radiation therapy.  He presented with shaking chills yesterday, and was admitted from ED for abscess in the gallbladder area.  He underwent percutaneous drainage tube placement by IR, he is feeling better overall today.   Objective:  Vitals:   01/03/22 0530 01/03/22 1331  BP: 134/75 131/76  Pulse: 68 (!) 56  Resp: 17 18  Temp: 98.7 F (37.1 C)   SpO2: 98% 97%    Body mass index is 26.26 kg/m.  Intake/Output Summary (Last 24 hours) at 01/03/2022 2046 Last data filed at 01/03/2022 1800 Gross per 24 hour  Intake 1305 ml  Output 1010 ml  Net 295 ml     Sclerae unicteric  Oropharynx clear  No peripheral adenopathy  Lungs clear -- no rales or rhonchi  Heart regular rate and rhythm  Abdomen soft, (+) cutaneous drainage tube in the right upper quadrant.  MSK no focal spinal tenderness, no peripheral edema  Neuro nonfocal    CBG (last 3)  No results for input(s): "GLUCAP" in the last 72 hours.   Labs:  Urine Studies No results for input(s): "UHGB", "CRYS" in the last 72 hours.  Invalid input(s): "UACOL", "UAPR", "USPG", "UPH", "UTP", "UGL", "UKET", "UBIL", "UNIT", "UROB", "ULEU", "UEPI", "UWBC", "URBC", "UBAC", "CAST", "UCOM", "BILUA"  Basic Metabolic Panel: Recent Labs  Lab 01/01/22 1230 01/01/22 1621 01/02/22 0450 01/03/22 0437  NA 135 136 135 135  K 3.7 3.3* 4.3 3.6  CL 99 101 102 102  CO2 '31 26 28 27  '$ GLUCOSE 119* 124* 104* 117*  BUN '10 10 10 11  '$ CREATININE 0.87 0.73 0.75 0.78  CALCIUM 9.2 8.1* 8.3* 8.0*   GFR Estimated Creatinine Clearance: 65.4 mL/min (by C-G formula based on SCr of 0.78 mg/dL). Liver Function Tests: Recent Labs  Lab 01/01/22 1230 01/01/22 1621 01/03/22 0437  AST '20 20 25  '$ ALT '11 12 16  '$ ALKPHOS 84 74 65  BILITOT 0.9 1.0 0.7  PROT 7.6 6.6  6.1*  ALBUMIN 3.6 2.8* 2.5*   Recent Labs  Lab 01/01/22 1621  LIPASE 26   No results for input(s): "AMMONIA" in the last 168 hours. Coagulation profile Recent Labs  Lab 01/01/22 1621  INR 1.1    CBC: Recent Labs  Lab 01/01/22 1230 01/01/22 1621 01/02/22 0450 01/03/22 0437  WBC 6.2 9.7 6.7 8.3  NEUTROABS 5.6 8.1*  --   --   HGB 13.2 11.5* 11.6* 11.5*  HCT 39.7 34.0* 35.5* 34.6*  MCV 96.6 96.9 98.6 97.7  PLT 170 144* 133* 147*   Cardiac Enzymes: No results for input(s): "CKTOTAL", "CKMB", "CKMBINDEX", "TROPONINI" in the last 168 hours. BNP: Invalid input(s): "POCBNP" CBG: No results for input(s): "GLUCAP" in the last 168 hours. D-Dimer No results for input(s): "DDIMER" in the last 72 hours. Hgb A1c No results for input(s): "HGBA1C" in the last 72 hours. Lipid Profile No results for input(s): "CHOL", "HDL", "LDLCALC", "TRIG", "CHOLHDL", "LDLDIRECT" in the last 72 hours. Thyroid function studies No results for input(s): "TSH", "T4TOTAL", "T3FREE", "THYROIDAB" in the last 72 hours.  Invalid input(s): "FREET3" Anemia work up No results for input(s): "VITAMINB12", "FOLATE", "FERRITIN", "TIBC", "IRON", "RETICCTPCT" in the last 72 hours. Microbiology Recent Results (from the past 240 hour(s))  Culture, blood (routine x 2)     Status: Abnormal (Preliminary result)  Collection Time: 01/01/22  3:46 PM   Specimen: BLOOD  Result Value Ref Range Status   Specimen Description   Final    BLOOD BLOOD LEFT FOREARM Performed at [facility] [address] [place], [place] [address]    Special Requests   Final    BOTTLES DRAWN AEROBIC AND ANAEROBIC Blood Culture results may not be optimal due to an excessive volume of blood received in culture bottles Performed at [facility] [address], [place], [place] [address]    Culture  Setup Time   Final    GRAM NEGATIVE RODS IN BOTH AEROBIC AND ANAEROBIC BOTTLES CRITICAL RESULT CALLED  TO, READ BACK BY AND VERIFIED WITH: PHARMD Melodye Ped [id number] BY EC    Culture (A)  Final    KLEBSIELLA PNEUMONIAE SUSCEPTIBILITIES TO FOLLOW Performed at [facility], [place] [address] [place], [place] [address]    Report Status PENDING  Incomplete  Blood Culture ID Panel (Reflexed)     Status: Abnormal   Collection Time: 01/01/22  3:46 PM  Result Value Ref Range Status   Enterococcus faecalis NOT DETECTED NOT DETECTED Final   Enterococcus Faecium NOT DETECTED NOT DETECTED Final   Listeria monocytogenes NOT DETECTED NOT DETECTED Final   Staphylococcus species NOT DETECTED NOT DETECTED Final   Staphylococcus aureus (BCID) NOT DETECTED NOT DETECTED Final   Staphylococcus epidermidis NOT DETECTED NOT DETECTED Final   Staphylococcus lugdunensis NOT DETECTED NOT DETECTED Final   Streptococcus species NOT DETECTED NOT DETECTED Final   Streptococcus agalactiae NOT DETECTED NOT DETECTED Final   Streptococcus pneumoniae NOT DETECTED NOT DETECTED Final   Streptococcus pyogenes NOT DETECTED NOT DETECTED Final   A.calcoaceticus-baumannii NOT DETECTED NOT DETECTED Final   Bacteroides fragilis NOT DETECTED NOT DETECTED Final   Enterobacterales DETECTED (A) NOT DETECTED Final    Comment: Enterobacterales represent a large order of gram negative bacteria, not a single organism. CRITICAL RESULT CALLED TO, READ BACK BY AND VERIFIED WITH: Jerome [id number] AT [id number] BY EC    Enterobacter cloacae complex NOT DETECTED NOT DETECTED Final   Escherichia coli NOT DETECTED NOT DETECTED Final   Klebsiella aerogenes NOT DETECTED NOT DETECTED Final   Klebsiella oxytoca NOT DETECTED NOT DETECTED Final   Klebsiella pneumoniae DETECTED (A) NOT DETECTED Final    Comment: CRITICAL RESULT CALLED TO, READ BACK BY AND VERIFIED WITH: PHARMD J. Scherrie November [id number] AT 0857 BY EC    Proteus species NOT DETECTED NOT DETECTED Final   Salmonella species NOT DETECTED NOT DETECTED Final   Serratia marcescens  NOT DETECTED NOT DETECTED Final   Haemophilus influenzae NOT DETECTED NOT DETECTED Final   Neisseria meningitidis NOT DETECTED NOT DETECTED Final   Pseudomonas aeruginosa NOT DETECTED NOT DETECTED Final   Stenotrophomonas maltophilia NOT DETECTED NOT DETECTED Final   Candida albicans NOT DETECTED NOT DETECTED Final   Candida auris NOT DETECTED NOT DETECTED Final   Candida glabrata NOT DETECTED NOT DETECTED Final   Candida krusei NOT DETECTED NOT DETECTED Final   Candida parapsilosis NOT DETECTED NOT DETECTED Final   Candida tropicalis NOT DETECTED NOT DETECTED Final   Cryptococcus neoformans/gattii NOT DETECTED NOT DETECTED Final   CTX-M ESBL NOT DETECTED NOT DETECTED Final   Carbapenem resistance IMP NOT DETECTED NOT DETECTED Final   Carbapenem resistance KPC NOT DETECTED NOT DETECTED Final   Carbapenem resistance NDM NOT DETECTED NOT DETECTED Final   Carbapenem resist OXA 48 LIKE NOT DETECTED NOT DETECTED Final  Carbapenem resistance VIM NOT DETECTED NOT DETECTED Final    Comment: Performed at [facility], [place] [address], [place], [place] [address]  Culture, blood (routine x 2)     Status: None (Preliminary result)   Collection Time: 01/01/22  4:30 PM   Specimen: BLOOD  Result Value Ref Range Status   Specimen Description   Final    BLOOD BLOOD RIGHT WRIST Performed at [facility] [address] [place], [place] [address]    Special Requests   Final    BOTTLES DRAWN AEROBIC AND ANAEROBIC Blood Culture adequate volume Performed at [facility] [address], [place], [place] [address]    Culture  Setup Time   Final    GRAM NEGATIVE RODS IN BOTH AEROBIC AND ANAEROBIC BOTTLES CRITICAL VALUE NOTED.  VALUE IS CONSISTENT WITH PREVIOUSLY REPORTED AND CALLED VALUE.    Culture   Final    GRAM NEGATIVE RODS IDENTIFICATION TO FOLLOW Performed at [facility], [place] [address], [place], [place] [address]    Report Status PENDING   Incomplete  Resp Panel by RT-PCR (Flu A&B, Covid) Anterior Nasal Swab     Status: None   Collection Time: 01/01/22  4:43 PM   Specimen: Anterior Nasal Swab  Result Value Ref Range Status   SARS Coronavirus 2 by RT PCR NEGATIVE NEGATIVE Final    Comment: (NOTE) SARS-CoV-2 target nucleic acids are NOT DETECTED.  The SARS-CoV-2 RNA is generally detectable in upper respiratory specimens during the acute phase of infection. The lowest concentration of SARS-CoV-2 viral copies this assay can detect is 138 copies/mL. A negative result does not preclude SARS-Cov-2 infection and should not be used as the sole basis for treatment or other patient management decisions. A negative result may occur with  improper specimen collection/handling, submission of specimen other than nasopharyngeal swab, presence of viral mutation(s) within the areas targeted by this assay, and inadequate number of viral copies(<138 copies/mL). A negative result must be combined with clinical observations, patient history, and epidemiological information. The expected result is Negative.  Fact Sheet for Patients:  EntrepreneurPulse.com.au  Fact Sheet for Healthcare Providers:  IncredibleEmployment.be  This test is no t yet approved or cleared by the [place] FDA and  has been authorized for detection and/or diagnosis of SARS-CoV-2 by FDA under an Emergency Use Authorization (EUA). This EUA will remain  in effect (meaning this test can be used) for the duration of the COVID-19 declaration under Section 564(b)(1) of the Act, 21 U.S.C.section 360bbb-3(b)(1), unless the authorization is terminated  or revoked sooner.       Influenza A by PCR NEGATIVE NEGATIVE Final   Influenza B by PCR NEGATIVE NEGATIVE Final    Comment: (NOTE) The Xpert Xpress SARS-CoV-2/FLU/RSV plus assay is intended as an aid in the diagnosis of influenza from Nasopharyngeal swab specimens and should not be  used as a sole basis for treatment. Nasal washings and aspirates are unacceptable for Xpert Xpress SARS-CoV-2/FLU/RSV testing.  Fact Sheet for Patients: EntrepreneurPulse.com.au  Fact Sheet for Healthcare Providers: IncredibleEmployment.be  This test is not yet approved or cleared by the [place] FDA and has been authorized for detection and/or diagnosis of SARS-CoV-2 by FDA under an Emergency Use Authorization (EUA). This EUA will remain in effect (meaning this test can be used) for the duration of the COVID-19 declaration under Section 564(b)(1) of the Act, 21 U.S.C. section 360bbb-3(b)(1), unless the authorization is terminated or revoked.  Performed at  [facility], [place] [address], [place], [place] [address]   Aerobic/Anaerobic Culture w Gram Stain (surgical/deep wound)     Status: None (Preliminary result)   Collection Time: 01/02/22  3:42 PM   Specimen: BILE  Result Value Ref Range Status   Specimen Description   Final    BILE Performed at [facility] [address], [place], [place] [address]    Special Requests   Final    NONE Performed at [facility], [place] [address], [place], [place] [address]    Gram Stain   Final    MODERATE WBC PRESENT, PREDOMINANTLY PMN MODERATE GRAM POSITIVE COCCI IN CLUSTERS    Culture   Final    NO GROWTH < 12 HOURS Performed at [facility] [address], [place], [place] [address]    Report Status PENDING  Incomplete      Studies:  IR Perc Cholecystostomy  Result Date: 01/02/2022 INDICATION: Extrahepatic cholangioCA. Q GB perforation. EXAM: ULTRASOUND AND FLUOROSCOPIC-GUIDED CHOLECYSTOSTOMY TUBE PLACEMENT COMPARISON:  CT AP, 01/03/2022. MEDICATIONS: The patient is currently admitted to the hospital and on intravenous antibiotics. Antibiotics were administered within an appropriate time frame prior to skin puncture.  ANESTHESIA/SEDATION: Moderate (conscious) sedation was employed during this procedure. A total of Versed 2 mg and Fentanyl 200 mcg was administered intravenously. Moderate Sedation Time: 25 minutes. The patient's level of consciousness and vital signs were monitored continuously by radiology nursing throughout the procedure under my direct supervision. CONTRAST:  68m OMNIPAQUE IOHEXOL 300 MG/ML SOLN - administered into the gallbladder fossa. FLUOROSCOPY TIME:  Fluoroscopic dose; 9 mGy COMPLICATIONS: None immediate. PROCEDURE: Informed written consent was obtained from the the patient and/or patient's representative after a discussion of the risks, benefits and alternatives to treatment. Questions regarding the procedure were encouraged and answered. A timeout was performed prior to the initiation of the procedure. The RIGHT upper abdominal quadrant was prepped and draped in the usual sterile fashion, and a sterile drape was applied covering the operative field. Maximum barrier sterile technique with sterile gowns and gloves were used for the procedure. A timeout was performed prior to the initiation of the procedure. Local anesthesia was provided with 1% lidocaine with epinephrine. Ultrasound scanning of the right upper quadrant demonstrates a markedly dilated gallbladder. Utilizing a transhepatic approach, a 22 gauge needle was advanced into the gallbladder under direct ultrasound guidance. An ultrasound image was saved for documentation purposes. Appropriate intraluminal puncture was confirmed with the efflux of bile and advancement of an 0.018 wire into the gallbladder lumen. The needle was exchanged for an ADe Bequeset. A small amount of contrast was injected to confirm appropriate intraluminal positioning. Over a short Amplatz wire, a 10 Fr cholecystomy tube was advanced into the gallbladder fossa, coiled and locked. Bile was aspirated and a small amount of contrast was injected as several post procedural  spot radiographic images were obtained in various obliquities. The catheter was secured to the skin with suture, connected to a drainage bag and a dressing was placed. The patient tolerated the procedure well without immediate post procedural complication. IMPRESSION: Successful placement of a 10 Fr cholecystostomy drainage tube, as above. PLAN: The patient will return to [facility] ([facility]) for routine drainage catheter evaluation and exchange in 8 weeks. JMichaelle Birks MD [facility] Electronically Signed   By: JMichaelle BirksM.D.   On: 01/02/2022 20:29    Assessment: 81y.o. male   Sepsis  from cholecystitis and abscess Extrahepatic cholangiocarcinoma, on palliative radiation AS Mild anemia  Moderate protein and calorie malnutrition   Plan:  -pt was seen by general surgeon. I agree with percutaneous cholecystostomy and antibiotics, per primary team.  I think he will follow-up with his general surgeon Dr. Zenia Resides, and may consider cholecystectomy down the road when he recovers well. -OK to continue radiation.  -I will f/u after his discharge.   Truitt Merle, MD 01/03/2022

## 2022-01-03 NOTE — Progress Notes (Signed)
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 Paulita Collier Dr. Littie Collier on Xeloda 8/14 and XRT additionally  Presents to oncology office 9/11 with uncontrolled shaking?  Fever Note that he had lost weight down from 187-162  Sent to emergency room as became more confused on arrival home-Tmax 101.4 leukocytosis absent hemoglobin 11.5 Sodium 136 potassium 3.3 LFTs bilirubin normal COVID flu negative CT ABD = gallbladder thickening?  Inflammation with possible abscess versus biloma collection Rx vancomycin Rocephin Flagyl General surgery consulted who discussed case with IR 9/12-10 French cholecystotomy tube placed by IR   Hospital-Problem based course  Sepsis on admission 2/2 cholecystitis --status post PERC drain 9/12 by IR transition to Augmentin-would complete 14 [may be 21?  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 Littie Collier on Xeloda 8/14 --continue XRT in the outpatient setting  would hold active chemo (Xeloda) until completion of antibiotics Was on lactulose 30 mils twice daily in the outpatient setting possibly can resume on discharge  Aortic stenosis Asymptomatic-outpatient  follow-up   DVT prophylaxis: SCD Code Status: DNR Family Communication: Called and discussed with the patient's wife Patrick Collier at [phone] answer Disposition:  Status is: Inpatient Remains inpatient appropriate because:   Needs teaching and Consultant follow up   Consultants:  IR Gen surg  Procedures: Perc drain 9/12  Antimicrobials: Appears comfortable no distress   Subjective: Awake coherent but a little confused-cannot recall what I told him no chest pain no fever states some discomfort  Objective: Vitals:   01/02/22 1614 01/02/22 1625 01/02/22 2049 01/03/22 0530  BP: 134/71 136/78 133/73 134/75  Pulse: 61 68 68 68  Resp: '12 14 17 17  '$ Temp:  98.1 F (36.7 C) 98.5 F (36.9 C) 98.7 F (37.1 C)  TempSrc:   Oral Oral  SpO2: 98% 99% 98% 98%  Weight:      Height:        Intake/Output Summary (Last 24 hours) at 01/03/2022 0701 Last data filed at 01/03/2022 0530 Gross per 24 hour  Intake 1030 ml  Output 915 ml  Net 115 ml   Filed Weights   01/01/22 2011  Weight: 73.8 kg    Examination:  Awake coherent but forgetful Eomi ncat No rales no rhonchi Abd soft no rebound no guarding drain in place draining freely Abdomen soft No lower extremity edema Moving 4 limbs  Data Reviewed: personally reviewed   CBC    Component Value Date/Time   WBC 8.3 01/03/2022 0437   RBC 3.54 (L) 01/03/2022 0437   HGB 11.5 (L) 01/03/2022 0437   HGB 12.1 (L) 10/27/2021 1232   HCT 34.6 (L) 01/03/2022 0437   PLT 147 (L) 01/03/2022 0437   PLT 367 10/27/2021 1232   MCV 97.7 01/03/2022 0437   MCH 32.5 01/03/2022 0437   MCHC 33.2  01/03/2022 0437   RDW 13.8 01/03/2022 0437   LYMPHSABS 0.1 (L) 01/01/2022 1621   MONOABS 1.4 (H) 01/01/2022 1621   EOSABS 0.0 01/01/2022 1621   BASOSABS 0.0 01/01/2022 1621      Latest Ref Rng & Units 01/03/2022    4:37 AM 01/02/2022    4:50 AM 01/01/2022    4:21 PM  CMP  Glucose 70 - 99 mg/dL 117  104  124   BUN 8 - 23 mg/dL '11  10  10    '$ Creatinine 0.61 - 1.24 mg/dL 0.78  0.75  0.73   Sodium 135 - 145 mmol/L 135  135  136   Potassium 3.5 - 5.1 mmol/L 3.6  4.3  3.3   Chloride 98 - 111 mmol/L 102  102  101   CO2 22 - 32 mmol/L '27  28  26   '$ Calcium 8.9 - 10.3 mg/dL 8.0  8.3  8.1   Total Protein 6.5 - 8.1 g/dL 6.1   6.6   Total Bilirubin 0.3 - 1.2 mg/dL 0.7   1.0   Alkaline Phos 38 - 126 U/L 65   74   AST 15 - 41 U/L 25   20   ALT 0 - 44 U/L 16   12      Radiology Studies: IR Perc Cholecystostomy  Result Date: 01/02/2022 INDICATION: Extrahepatic cholangioCA. Q GB perforation. EXAM: ULTRASOUND AND FLUOROSCOPIC-GUIDED CHOLECYSTOSTOMY TUBE PLACEMENT COMPARISON:  CT AP, 01/03/2022. MEDICATIONS: The patient is currently admitted to the hospital and on intravenous antibiotics. Antibiotics were administered within an appropriate time frame prior to skin puncture. ANESTHESIA/SEDATION: Moderate (conscious) sedation was employed during this procedure. A total of Versed 2 mg and Fentanyl 200 mcg was administered intravenously. Moderate Sedation Time: 25 minutes. The patient's level of consciousness and vital signs were monitored continuously by radiology nursing throughout the procedure under my direct supervision. CONTRAST:  69m OMNIPAQUE IOHEXOL 300 MG/ML SOLN - administered into the gallbladder fossa. FLUOROSCOPY TIME:  Fluoroscopic dose; 9 mGy COMPLICATIONS: None immediate. PROCEDURE: Informed written consent was obtained from the the patient and/or patient's representative after a discussion of the risks, benefits and alternatives to treatment. Questions regarding the procedure were encouraged and answered. A timeout was performed prior to the initiation of the procedure. The RIGHT upper abdominal quadrant was prepped and draped in the usual sterile fashion, and a sterile drape was applied covering the operative field. Maximum barrier sterile technique with sterile gowns and gloves were used for the procedure. A timeout was performed prior  to the initiation of the procedure. Local anesthesia was provided with 1% lidocaine with epinephrine. Ultrasound scanning of the right upper quadrant demonstrates a markedly dilated gallbladder. Utilizing a transhepatic approach, a 22 gauge needle was advanced into the gallbladder under direct ultrasound guidance. An ultrasound image was saved for documentation purposes. Appropriate intraluminal puncture was confirmed with the efflux of bile and advancement of an 0.018 wire into the gallbladder lumen. The needle was exchanged for an ABockset. A small amount of contrast was injected to confirm appropriate intraluminal positioning. Over a short Amplatz wire, a 10 Fr cholecystomy tube was advanced into the gallbladder fossa, coiled and locked. Bile was aspirated and a small amount of contrast was injected as several post procedural spot radiographic images were obtained in various obliquities. The catheter was secured to the skin with suture, connected to a drainage bag and a dressing was placed. The patient tolerated the procedure well without immediate post procedural complication. IMPRESSION: Successful  placement of a 10 Fr cholecystostomy drainage tube, as above. PLAN: The patient will return to V[facility]iology (VIR) for routine drainage catheter evaluation and exchange in 8 weeks. Michaelle Birks, MD [facility] Electronically Signed   By: Michaelle Birks M.D.   On: 01/02/2022 20:29   CT Abdomen Pelvis W Contrast  Result Date: 01/01/2022 CLINICAL DATA:  Acute abdominal pain. Radiation therapy to bile ducts today. EXAM: CT ABDOMEN AND PELVIS WITH CONTRAST TECHNIQUE: Multidetector CT imaging of the abdomen and pelvis was performed using the standard protocol following bolus administration of intravenous contrast. RADIATION DOSE REDUCTION: This exam was performed according to the departmental dose-optimization program which includes  automated exposure control, adjustment of the mA and/or kV according to patient size and/or use of iterative reconstruction technique. CONTRAST:  117m OMNIPAQUE IOHEXOL 300 MG/ML  SOLN COMPARISON:  MRI abdomen 10/07/2021. FINDINGS: Lower chest: There is atelectasis in the lung bases. Hepatobiliary: Common bile duct stent is in place. There is pneumobilia likely related to the stent. There is gallbladder wall thickening with mild surrounding inflammation. There are 2 enhancing fluid attenuation areas within the gallbladder fossa. One is likely the gallbladder and other is likely abnormal fluid collection such as abscess or biloma. The larger collection is more posterior measuring 2.0 x 6.3 cm. In the smaller collection is more anterior measuring 3.7 x 1.4 cm. No new focal liver lesions are identified. Pancreas: 1 cm cystic lesion in the pancreatic tail appears unchanged. No pancreatic ductal dilatation or surrounding inflammation. Spleen: Normal in size without focal abnormality. Adrenals/Urinary Tract: Adrenal glands are unremarkable. Kidneys are normal, without renal calculi, focal lesion, or hydronephrosis. Bladder is unremarkable. Stomach/Bowel: Stomach is within normal limits. No evidence of bowel wall thickening, distention, or inflammatory changes. The appendix is not seen. There is sigmoid colon diverticulosis. Vascular/Lymphatic: Aortic atherosclerosis. No enlarged abdominal or pelvic lymph nodes. Reproductive: Prostate is unremarkable. Other: No ascites.  Small fat containing left inguinal hernia. Musculoskeletal: Severe degenerative changes affect the spine. There are moderate degenerative changes of both hips. IMPRESSION: 1. There are 2 enhancing fluid collections in the gallbladder fossa. One is felt to represent gallbladder and the other is likely abnormal fluid collection such as biloma or abscess. These collections demonstrate wall thickening with surrounding inflammation. 2. New common bile duct  stent in place with pneumobilia. Electronically Signed   By: ARonney AstersM.D.   On: 01/01/2022 17:43   CT Head Wo Contrast  Result Date: 01/01/2022 CLINICAL DATA:  Mental status change EXAM: CT HEAD WITHOUT CONTRAST TECHNIQUE: Contiguous axial images were obtained from the base of the skull through the vertex without intravenous contrast. RADIATION DOSE REDUCTION: This exam was performed according to the departmental dose-optimization program which includes automated exposure control, adjustment of the mA and/or kV according to patient size and/or use of iterative reconstruction technique. COMPARISON:  None Available. FINDINGS: Brain: No acute territorial infarction, hemorrhage or intracranial mass. Moderate atrophy. Mild chronic small vessel ischemic changes of the white matter. The ventricles are nonenlarged. Vascular: No hyperdense vessels.  Carotid vascular calcification. Skull: Normal. Negative for fracture or focal lesion. Sinuses/Orbits: No acute finding. Other: None IMPRESSION: 1. No CT evidence for acute intracranial abnormality. 2. Atrophy and mild chronic small vessel ischemic changes of the white matter Electronically Signed   By: KDonavan FoilM.D.   On: 01/01/2022 17:32   DG Chest 1 View  Result Date: 01/01/2022 CLINICAL DATA:  Cough. EXAM: CHEST  1 VIEW  COMPARISON:  CT of the chest 11/03/2021 FINDINGS: The heart size and mediastinal contours are within normal limits. Both lungs are clear. The visualized skeletal structures are unremarkable. IMPRESSION: No active disease. Electronically Signed   By: Ronney Asters M.D.   On: 01/01/2022 17:27     Scheduled Meds:  sodium chloride flush  5 mL Intracatheter Q8H   Continuous Infusions:  cefTRIAXone (ROCEPHIN)  IV Stopped (01/02/22 1709)   metronidazole 500 mg (01/02/22 2034)     LOS: 2 days   Time spent: [place], MD [facility] To contact the attending provider between 7A-7P or the covering provider during  after hours 7P-7A, please log into the web site [email or web address] and access using universal [place] password for that web site. If you do not have the password, please call the hospital operator.  01/03/2022, 7:01 AM

## 2022-01-04 ENCOUNTER — Other Ambulatory Visit: Payer: Self-pay

## 2022-01-04 ENCOUNTER — Ambulatory Visit
Admission: RE | Admit: 2022-01-04 | Discharge: 2022-01-04 | Disposition: A | Discharge status: Home / Self Care | Payer: Medicare Other | Source: Ambulatory Visit | Attending: Radiation Oncology | Admitting: Radiation Oncology

## 2022-01-04 DIAGNOSIS — C221 Intrahepatic bile duct carcinoma: Secondary | ICD-10-CM | POA: Diagnosis not present

## 2022-01-04 DIAGNOSIS — K810 Acute cholecystitis: Secondary | ICD-10-CM | POA: Diagnosis not present

## 2022-01-04 DIAGNOSIS — Z510 Encounter for antineoplastic radiation therapy: Secondary | ICD-10-CM | POA: Diagnosis not present

## 2022-01-04 LAB — RAD ONC ARIA SESSION SUMMARY
Course Elapsed Days: 31
Plan Fractions Treated to Date: 21
Plan Prescribed Dose Per Fraction: 1.8 Gy
Plan Total Fractions Prescribed: 25
Plan Total Prescribed Dose: 45 Gy
Reference Point Dosage Given to Date: 37.8 Gy
Reference Point Session Dosage Given: 1.8 Gy
Session Number: 21

## 2022-01-04 LAB — CULTURE, BLOOD (ROUTINE X 2): Special Requests: ADEQUATE

## 2022-01-04 MED ORDER — AMOXICILLIN-POT CLAVULANATE 875-125 MG PO TABS: 1.0000 | ORAL_TABLET | Freq: Two times a day (BID) | ORAL | 0 refills | Status: AC

## 2022-01-04 MED ORDER — TRAMADOL HCL 50 MG PO TABS: 50.0000 mg | ORAL_TABLET | Freq: Four times a day (QID) | ORAL | 0 refills | Status: DC | PRN

## 2022-01-04 NOTE — Care Management Important Message (Signed)
Important Message  Patient Details IM Letter given to the Patient Name: Patrick Collier MRN: [id number] Date of Birth: 1941-02-02   Medicare Important Message Given:  Yes     Kerin Salen 01/04/2022, 11:10 AM

## 2022-01-04 NOTE — TOC Transition Note (Addendum)
Transition of Care [facility]) - CM/SW Discharge Note   Patient Details  Name: Patrick Collier MRN: [id number] Date of Birth: [birth year]  [facility] of [facility] CM/SW Contact:  Servando Snare, LCSW Phone Number: 01/04/2022, 11:02 AM   Clinical Narrative:   Patient to dc home with home health. [place] arranged with [facility]. Floor RN confirmed with TOC via secure chat.   12:16 PM Notified spouse of [place] arrangements.    Final next level of care: [place] Barriers to Discharge: No Barriers Identified   Patient Goals and CMS Choice Patient states their goals for this hospitalization and ongoing recovery are:: To return home   Choice offered to / list presented to : Patient, Spouse  Discharge Placement                       Discharge Plan and Services In-house Referral: NA Discharge Planning Services: CM Consult Post Acute Care Choice: Home Health          DME Arranged: N/A DME Agency: NA       HH Arranged: RN, Nurse's Aide HH Agency: [place] Date [facility] Agency Contacted: 01/03/22 Time [place]: 3734 Representative spoke with at [place]: [place] Determinants of Health ([place]) Interventions     Readmission Risk Interventions     No data to display

## 2022-01-04 NOTE — Progress Notes (Signed)
Discharge instructions discussed with patient and wife, including biliary drain care, verbalized agreement and understanding

## 2022-01-04 NOTE — Discharge Summary (Signed)
.[email or web address] Discharge Summary  Patrick Collier KKX:[id number] DOB: January 27, 1941 DOA: 01/01/2022  PCP: [place] Pepper, MD  Admit date: 01/01/2022 Discharge date: 01/04/2022  Time spent: 37 minutes  Recommendations for Outpatient Follow-up:  Requires drain management in the outpatient setting with IR and coordination with oncology-I have CCed both oncologist and IR physician so that they are aware Recommend Chem-12, CBC INR in 1 week Should complete antibiotics 9/24 Augmentin to cover Klebsiella as well as Enterobacter growing on blood cultures  Discharge Diagnoses:  MAIN problem for hospitalization   Cholecystitis status post cholecystotomy drain 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 Asymptomatic aortic stenosis HTN   Please see below for itemized issues addressed in [place]- refer to other progress notes for clarity if needed  Discharge Condition: Improved  Diet recommendation: Heart healthy  Filed Weights   01/01/22 2011  Weight: 73.8 kg    History of present illness:  81 year old white male Chronic low back pain with osteoarthritis Bradycardia, aortic stenosis Prior right knee arthroplasty Admitted 6/16 through 6/22 with painless jaundice elevated CA 19-9 to 129 with intra/extrahepatic dilatation-EUS at the time showed single localized malignant appearing severe biliary obstruction main bile duct and sphincterotomy was obtained plastic stent was placed-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 Paulita Fujita Dr. Littie Deeds on Xeloda 8/14 and XRT additionally   Presents to oncology office 9/11 with uncontrolled shaking?  Fever Note that he had lost weight down from 187-162   Sent to emergency room as became more confused on arrival home-Tmax 101.4 leukocytosis absent hemoglobin 11.5 Sodium 136 potassium 3.3 LFTs bilirubin normal COVID flu negative CT ABD = gallbladder thickening?  Inflammation with possible abscess versus biloma collection Rx  vancomycin Rocephin Flagyl General surgery consulted who discussed case with IR 9/12-10 French cholecystotomy tube placed by IR  Hospital Course:  Sepsis on admission 2/2 cholecystitis --status post PERC drain 9/12 by IR transition to Augmentin-would complete 14 [may be 21?  ]Total days ending 9/24 Will need drain study as well as teaching of drain management with wife who he lives with-he seems functionally independent --he will discharge with home health at home I have spoken to his wife in detail previously and she understands that teaching will be done prior to discharge and can be followed up on in the outpatient setting For pain control give tramadol 50 every 6 as needed for moderate pain-discontinue morphine   Cholangiocarcinoma followed by Dr. Lincoln Maxin Xeloda 8/14 on hold until further notice--continue XRT in the outpatient setting  would hold active chemo (Xeloda) until completion of antibiotics Was on lactulose 30 mils twice daily in the outpatient setting possibly can resume on discharge   Aortic stenosis Asymptomatic-outpatient follow-up  Discharge Exam: Vitals:   01/03/22 2204 01/04/22 0431  BP: 123/83 (!) 142/78  Pulse: (!) 56 (!) 58  Resp: 18 18  Temp: 98.2 F (36.8 C) 97.6 F (36.4 C)  SpO2: 98% 97%    Subj on day of d/c   Awake coherent no distress comfortable does not seem to be in pain and is ambulatory  General Exam on discharge  EOMI NCAT no focal deficit no icterus no rales rhonchi Chest clear no added sound Abdomen soft no rebound no guarding cholecystotomy drain draining well No lower extremity edema ROM intact neurologically intact  Discharge Instructions   Discharge Instructions     Diet - low sodium heart healthy   Complete by: As directed    Discharge instructions   Complete by: As directed  You were admitted to the hospital with likely gallbladder infection and had a drain placed-you will need to continue oral Augmentin until  01/14/2022 which will help clear up the infection that spread to your bloodstream and this should cover the bugs that grew in the bloodstream You will need lab work in about 1 week at your primary care office and the radiologist who performed the drain placement will follow you as well to ensure that this is regularly monitored We will get home health come out to help you learn how to manipulate and use a drain We have given you a little bit of tramadol for pain Do not take Xeloda until you follow-up with Dr. Mauri Brooklyn of luck   Increase activity slowly   Complete by: As directed    No wound care   Complete by: As directed       Allergies as of 01/04/2022   No Known Allergies      Medication List     STOP taking these medications    capecitabine 500 MG tablet Commonly known as: XELODA       TAKE these medications    acetaminophen 500 MG tablet Commonly known as: TYLENOL Take 1,000 mg by mouth daily as needed (pain).   amoxicillin-clavulanate 875-125 MG tablet Commonly known as: AUGMENTIN Take 1 tablet by mouth every 12 (twelve) hours for 10 days.   lactulose 10 GM/15ML solution Commonly known as: CHRONULAC Take 30 mLs (20 g total) by mouth 2 (two) times daily as needed for mild constipation or moderate constipation. What changed:  how much to take when to take this   ondansetron 8 MG tablet Commonly known as: ZOFRAN Take 1 tablet (8 mg total) by mouth every 8 (eight) hours as needed for nausea or vomiting.   traMADol 50 MG tablet Commonly known as: ULTRAM Take 1 tablet (50 mg total) by mouth every 6 (six) hours as needed for moderate pain.       No Known Allergies  Follow-up Information     Dwan Bolt, MD. Go on 01/24/2022.   Specialty: General Surgery Why: 240pm. Please arrive 30 minutes prior to your appointment for paperwork. Please bring a copy of your photo ID and insurance card. Contact information: [place]. [address]  [phone]         Michaelle Birks, MD. Schedule an appointment as soon as possible for a visit.   Specialties: Interventional Radiology, Diagnostic Radiology, Radiology Contact information: [address] [place] [address] [address] [phone]                  The results of significant diagnostics from this hospitalization (including imaging, microbiology, ancillary and laboratory) are listed below for reference.    Significant Diagnostic Studies: IR Perc Cholecystostomy  Result Date: 01/02/2022 INDICATION: Extrahepatic cholangioCA. Q GB perforation. EXAM: ULTRASOUND AND FLUOROSCOPIC-GUIDED CHOLECYSTOSTOMY TUBE PLACEMENT COMPARISON:  CT AP, 01/03/2022. MEDICATIONS: The patient is currently admitted to the hospital and on intravenous antibiotics. Antibiotics were administered within an appropriate time frame prior to skin puncture. ANESTHESIA/SEDATION: Moderate (conscious) sedation was employed during this procedure. A total of Versed 2 mg and Fentanyl 200 mcg was administered intravenously. Moderate Sedation Time: 25 minutes. The patient's level of consciousness and vital signs were monitored continuously by radiology nursing throughout the procedure under my direct supervision. CONTRAST:  11m OMNIPAQUE IOHEXOL 300 MG/ML SOLN - administered into the gallbladder fossa. FLUOROSCOPY TIME:  Fluoroscopic dose; 9  mGy COMPLICATIONS: None immediate. PROCEDURE: Informed written consent was obtained from the the patient and/or patient's representative after a discussion of the risks, benefits and alternatives to treatment. Questions regarding the procedure were encouraged and answered. A timeout was performed prior to the initiation of the procedure. The RIGHT upper abdominal quadrant was prepped and draped in the usual sterile fashion, and a sterile drape was applied covering the operative field. Maximum barrier sterile technique with sterile gowns and gloves were used for the  procedure. A timeout was performed prior to the initiation of the procedure. Local anesthesia was provided with 1% lidocaine with epinephrine. Ultrasound scanning of the right upper quadrant demonstrates a markedly dilated gallbladder. Utilizing a transhepatic approach, a 22 gauge needle was advanced into the gallbladder under direct ultrasound guidance. An ultrasound image was saved for documentation purposes. Appropriate intraluminal puncture was confirmed with the efflux of bile and advancement of an 0.018 wire into the gallbladder lumen. The needle was exchanged for an Robeline set. A small amount of contrast was injected to confirm appropriate intraluminal positioning. Over a short Amplatz wire, a 10 Fr cholecystomy tube was advanced into the gallbladder fossa, coiled and locked. Bile was aspirated and a small amount of contrast was injected as several post procedural spot radiographic images were obtained in various obliquities. The catheter was secured to the skin with suture, connected to a drainage bag and a dressing was placed. The patient tolerated the procedure well without immediate post procedural complication. IMPRESSION: Successful placement of a 10 Fr cholecystostomy drainage tube, as above. PLAN: The patient will return to Vascular Interventional Radiology (VIR) for routine drainage catheter evaluation and exchange in 8 weeks. Michaelle Birks, MD [facility] Radiology Electronically Signed   By: Michaelle Birks M.D.   On: 01/02/2022 20:29   CT Abdomen Pelvis W Contrast  Result Date: 01/01/2022 CLINICAL DATA:  Acute abdominal pain. Radiation therapy to bile ducts today. EXAM: CT ABDOMEN AND PELVIS WITH CONTRAST TECHNIQUE: Multidetector CT imaging of the abdomen and pelvis was performed using the standard protocol following bolus administration of intravenous contrast. RADIATION DOSE REDUCTION: This exam was performed according to the departmental  dose-optimization program which includes automated exposure control, adjustment of the mA and/or kV according to patient size and/or use of iterative reconstruction technique. CONTRAST:  135m OMNIPAQUE IOHEXOL 300 MG/ML  SOLN COMPARISON:  MRI abdomen 10/07/2021. FINDINGS: Lower chest: There is atelectasis in the lung bases. Hepatobiliary: Common bile duct stent is in place. There is pneumobilia likely related to the stent. There is gallbladder wall thickening with mild surrounding inflammation. There are 2 enhancing fluid attenuation areas within the gallbladder fossa. One is likely the gallbladder and other is likely abnormal fluid collection such as abscess or biloma. The larger collection is more posterior measuring 2.0 x 6.3 cm. In the smaller collection is more anterior measuring 3.7 x 1.4 cm. No new focal liver lesions are identified. Pancreas: 1 cm cystic lesion in the pancreatic tail appears unchanged. No pancreatic ductal dilatation or surrounding inflammation. Spleen: Normal in size without focal abnormality. Adrenals/Urinary Tract: Adrenal glands are unremarkable. Kidneys are normal, without renal calculi, focal lesion, or hydronephrosis. Bladder is unremarkable. Stomach/Bowel: Stomach is within normal limits. No evidence of bowel wall thickening, distention, or inflammatory changes. The appendix is not seen. There is sigmoid colon diverticulosis. Vascular/Lymphatic: Aortic atherosclerosis. No enlarged abdominal or pelvic lymph nodes. Reproductive: Prostate is unremarkable. Other: No ascites.  Small fat containing left inguinal hernia. Musculoskeletal: Severe  degenerative changes affect the spine. There are moderate degenerative changes of both hips. IMPRESSION: 1. There are 2 enhancing fluid collections in the gallbladder fossa. One is felt to represent gallbladder and the other is likely abnormal fluid collection such as biloma or abscess. These collections demonstrate wall thickening with surrounding  inflammation. 2. New common bile duct stent in place with pneumobilia. Electronically Signed   By: Ronney Asters M.D.   On: 01/01/2022 17:43   CT Head Wo Contrast  Result Date: 01/01/2022 CLINICAL DATA:  Mental status change EXAM: CT HEAD WITHOUT CONTRAST TECHNIQUE: Contiguous axial images were obtained from the base of the skull through the vertex without intravenous contrast. RADIATION DOSE REDUCTION: This exam was performed according to the departmental dose-optimization program which includes automated exposure control, adjustment of the mA and/or kV according to patient size and/or use of iterative reconstruction technique. COMPARISON:  None Available. FINDINGS: Brain: No acute territorial infarction, hemorrhage or intracranial mass. Moderate atrophy. Mild chronic small vessel ischemic changes of the white matter. The ventricles are nonenlarged. Vascular: No hyperdense vessels.  Carotid vascular calcification. Skull: Normal. Negative for fracture or focal lesion. Sinuses/Orbits: No acute finding. Other: None IMPRESSION: 1. No CT evidence for acute intracranial abnormality. 2. Atrophy and mild chronic small vessel ischemic changes of the white matter Electronically Signed   By: Donavan Foil M.D.   On: 01/01/2022 17:32   DG Chest 1 View  Result Date: 01/01/2022 CLINICAL DATA:  Cough. EXAM: CHEST  1 VIEW COMPARISON:  CT of the chest 11/03/2021 FINDINGS: The heart size and mediastinal contours are within normal limits. Both lungs are clear. The visualized skeletal structures are unremarkable. IMPRESSION: No active disease. Electronically Signed   By: Ronney Asters M.D.   On: 01/01/2022 17:27    Microbiology: Recent Results (from the past 240 hour(s))  Culture, blood (routine x 2)     Status: Abnormal (Preliminary result)   Collection Time: 01/01/22  3:46 PM   Specimen: BLOOD  Result Value Ref Range Status   Specimen Description   Final    BLOOD BLOOD LEFT FOREARM Performed at [facility], [place] [address], [place], [place] [address]    Special Requests   Final    BOTTLES DRAWN AEROBIC AND ANAEROBIC Blood Culture results may not be optimal due to an excessive volume of blood received in culture bottles Performed at [facility] [address] [place], [place] [address]    Culture  Setup Time   Final    GRAM NEGATIVE RODS IN BOTH AEROBIC AND ANAEROBIC BOTTLES CRITICAL RESULT CALLED TO, READ BACK BY AND VERIFIED WITH: PHARMD Melodye Ped [id number] BY EC    Culture (A)  Final    KLEBSIELLA PNEUMONIAE SUSCEPTIBILITIES TO FOLLOW Performed at [facility], [place] [address], [place], [place] [address]    Report Status PENDING  Incomplete  Blood Culture ID Panel (Reflexed)     Status: Abnormal   Collection Time: 01/01/22  3:46 PM  Result Value Ref Range Status   Enterococcus faecalis NOT DETECTED NOT DETECTED Final   Enterococcus Faecium NOT DETECTED NOT DETECTED Final   Listeria monocytogenes NOT DETECTED NOT DETECTED Final   Staphylococcus species NOT DETECTED NOT DETECTED Final   Staphylococcus aureus (BCID) NOT DETECTED NOT DETECTED Final   Staphylococcus epidermidis NOT DETECTED NOT DETECTED Final   Staphylococcus lugdunensis NOT DETECTED NOT DETECTED Final   Streptococcus species NOT DETECTED NOT DETECTED Final   Streptococcus agalactiae NOT  DETECTED NOT DETECTED Final   Streptococcus pneumoniae NOT DETECTED NOT DETECTED Final   Streptococcus pyogenes NOT DETECTED NOT DETECTED Final   A.calcoaceticus-baumannii NOT DETECTED NOT DETECTED Final   Bacteroides fragilis NOT DETECTED NOT DETECTED Final   Enterobacterales DETECTED (A) NOT DETECTED Final    Comment: Enterobacterales represent a large order of gram negative bacteria, not a single organism. CRITICAL RESULT CALLED TO, READ BACK BY AND VERIFIED WITH: [place] [id number] AT 0347 BY EC    Enterobacter cloacae complex NOT DETECTED NOT DETECTED Final   Escherichia  coli NOT DETECTED NOT DETECTED Final   Klebsiella aerogenes NOT DETECTED NOT DETECTED Final   Klebsiella oxytoca NOT DETECTED NOT DETECTED Final   Klebsiella pneumoniae DETECTED (A) NOT DETECTED Final    Comment: CRITICAL RESULT CALLED TO, READ BACK BY AND VERIFIED WITH: PHARMD J. Scherrie November [id number] AT 0857 BY EC    Proteus species NOT DETECTED NOT DETECTED Final   Salmonella species NOT DETECTED NOT DETECTED Final   Serratia marcescens NOT DETECTED NOT DETECTED Final   Haemophilus influenzae NOT DETECTED NOT DETECTED Final   Neisseria meningitidis NOT DETECTED NOT DETECTED Final   Pseudomonas aeruginosa NOT DETECTED NOT DETECTED Final   Stenotrophomonas maltophilia NOT DETECTED NOT DETECTED Final   Candida albicans NOT DETECTED NOT DETECTED Final   Candida auris NOT DETECTED NOT DETECTED Final   Candida glabrata NOT DETECTED NOT DETECTED Final   Candida krusei NOT DETECTED NOT DETECTED Final   Candida parapsilosis NOT DETECTED NOT DETECTED Final   Candida tropicalis NOT DETECTED NOT DETECTED Final   Cryptococcus neoformans/gattii NOT DETECTED NOT DETECTED Final   CTX-M ESBL NOT DETECTED NOT DETECTED Final   Carbapenem resistance IMP NOT DETECTED NOT DETECTED Final   Carbapenem resistance KPC NOT DETECTED NOT DETECTED Final   Carbapenem resistance NDM NOT DETECTED NOT DETECTED Final   Carbapenem resist OXA 48 LIKE NOT DETECTED NOT DETECTED Final   Carbapenem resistance VIM NOT DETECTED NOT DETECTED Final    Comment: Performed at [facility], [address] [place], [place] [address]  Culture, blood (routine x 2)     Status: None (Preliminary result)   Collection Time: 01/01/22  4:30 PM   Specimen: BLOOD  Result Value Ref Range Status   Specimen Description   Final    BLOOD BLOOD RIGHT WRIST Performed at [facility] [address] [place], [place] [address]    Special Requests   Final    BOTTLES DRAWN AEROBIC AND ANAEROBIC Blood Culture adequate  volume Performed at [facility] [address] [place], [place] [address]    Culture  Setup Time   Final    GRAM NEGATIVE RODS IN BOTH AEROBIC AND ANAEROBIC BOTTLES CRITICAL VALUE NOTED.  VALUE IS CONSISTENT WITH PREVIOUSLY REPORTED AND CALLED VALUE.    Culture   Final    GRAM NEGATIVE RODS IDENTIFICATION TO FOLLOW Performed at [facility], [place] [address], [place], [place] [address]    Report Status PENDING  Incomplete  Resp Panel by RT-PCR (Flu A&B, Covid) Anterior Nasal Swab     Status: None   Collection Time: 01/01/22  4:43 PM   Specimen: Anterior Nasal Swab  Result Value Ref Range Status   SARS Coronavirus 2 by RT PCR NEGATIVE NEGATIVE Final    Comment: (NOTE) SARS-CoV-2 target nucleic acids are NOT DETECTED.  The SARS-CoV-2 RNA is generally detectable in upper respiratory specimens during the acute phase of infection. The  lowest concentration of SARS-CoV-2 viral copies this assay can detect is 138 copies/mL. A negative result does not preclude SARS-Cov-2 infection and should not be used as the sole basis for treatment or other patient management decisions. A negative result may occur with  improper specimen collection/handling, submission of specimen other than nasopharyngeal swab, presence of viral mutation(s) within the areas targeted by this assay, and inadequate number of viral copies(<138 copies/mL). A negative result must be combined with clinical observations, patient history, and epidemiological information. The expected result is Negative.  Fact Sheet for Patients:  EntrepreneurPulse.com.au  Fact Sheet for Healthcare Providers:  IncredibleEmployment.be  This test is no t yet approved or cleared by the [place] FDA and  has been authorized for detection and/or diagnosis of SARS-CoV-2 by FDA under an Emergency Use Authorization (EUA). This EUA will remain  in effect (meaning this test can be  used) for the duration of the COVID-19 declaration under Section 564(b)(1) of the Act, 21 U.S.C.section 360bbb-3(b)(1), unless the authorization is terminated  or revoked sooner.       Influenza A by PCR NEGATIVE NEGATIVE Final   Influenza B by PCR NEGATIVE NEGATIVE Final    Comment: (NOTE) The Xpert Xpress SARS-CoV-2/FLU/RSV plus assay is intended as an aid in the diagnosis of influenza from Nasopharyngeal swab specimens and should not be used as a sole basis for treatment. Nasal washings and aspirates are unacceptable for Xpert Xpress SARS-CoV-2/FLU/RSV testing.  Fact Sheet for Patients: EntrepreneurPulse.com.au  Fact Sheet for Healthcare Providers: IncredibleEmployment.be  This test is not yet approved or cleared by the [place] FDA and has been authorized for detection and/or diagnosis of SARS-CoV-2 by FDA under an Emergency Use Authorization (EUA). This EUA will remain in effect (meaning this test can be used) for the duration of the COVID-19 declaration under Section 564(b)(1) of the Act, 21 U.S.C. section 360bbb-3(b)(1), unless the authorization is terminated or revoked.  Performed at [facility], [place] [address], [place], [place] [address]   Aerobic/Anaerobic Culture w Gram Stain (surgical/deep wound)     Status: None (Preliminary result)   Collection Time: 01/02/22  3:42 PM   Specimen: BILE  Result Value Ref Range Status   Specimen Description   Final    BILE Performed at [facility] [address], [place], [place] [address]    Special Requests   Final    NONE Performed at [facility], [place] [address] [place], [place] [address]    Gram Stain   Final    MODERATE WBC PRESENT, PREDOMINANTLY PMN MODERATE GRAM POSITIVE COCCI IN CLUSTERS    Culture   Final    CULTURE REINCUBATED FOR BETTER GROWTH Performed at [facility], [place] [address] [place],   [address]    Report Status PENDING  Incomplete     Labs: Basic Metabolic Panel: Recent Labs  Lab 01/01/22 1230 01/01/22 1621 01/02/22 0450 01/03/22 0437  NA 135 136 135 135  K 3.7 3.3* 4.3 3.6  CL 99 101 102 102  CO2 '31 26 28 27  '$ GLUCOSE 119* 124* 104* 117*  BUN '10 10 10 11  '$ CREATININE 0.87 0.73 0.75 0.78  CALCIUM 9.2 8.1* 8.3* 8.0*   Liver Function Tests: Recent Labs  Lab 01/01/22 1230 01/01/22 1621 01/03/22 0437  AST '20 20 25  '$ ALT '11 12 16  '$ ALKPHOS 84 74 65  BILITOT 0.9 1.0 0.7  PROT 7.6 6.6 6.1*  ALBUMIN 3.6 2.8* 2.5*  Recent Labs  Lab 01/01/22 1621  LIPASE 26   No results for input(s): "AMMONIA" in the last 168 hours. CBC: Recent Labs  Lab 01/01/22 1230 01/01/22 1621 01/02/22 0450 01/03/22 0437  WBC 6.2 9.7 6.7 8.3  NEUTROABS 5.6 8.1*  --   --   HGB 13.2 11.5* 11.6* 11.5*  HCT 39.7 34.0* 35.5* 34.6*  MCV 96.6 96.9 98.6 97.7  PLT 170 144* 133* 147*   Cardiac Enzymes: No results for input(s): "CKTOTAL", "CKMB", "CKMBINDEX", "TROPONINI" in the last 168 hours. BNP: BNP (last 3 results) No results for input(s): "BNP" in the last 8760 hours.  ProBNP (last 3 results) No results for input(s): "PROBNP" in the last 8760 hours.  CBG: No results for input(s): "GLUCAP" in the last 168 hours.     Signed:  Nita Sells MD   [facility] 01/04/2022, 9:44 AM

## 2022-01-05 ENCOUNTER — Telehealth: Payer: Self-pay | Admitting: Hematology

## 2022-01-05 ENCOUNTER — Ambulatory Visit: Payer: Medicare Other

## 2022-01-05 LAB — CYTOLOGY - NON PAP

## 2022-01-05 NOTE — Telephone Encounter (Signed)
Scheduled follow-up appointment per 9/11 los. Patient is aware.

## 2022-01-06 DIAGNOSIS — Z9181 History of falling: Secondary | ICD-10-CM | POA: Diagnosis not present

## 2022-01-06 DIAGNOSIS — M1990 Unspecified osteoarthritis, unspecified site: Secondary | ICD-10-CM | POA: Diagnosis not present

## 2022-01-06 DIAGNOSIS — Z9689 Presence of other specified functional implants: Secondary | ICD-10-CM | POA: Diagnosis not present

## 2022-01-06 DIAGNOSIS — K810 Acute cholecystitis: Secondary | ICD-10-CM | POA: Diagnosis not present

## 2022-01-06 DIAGNOSIS — G8929 Other chronic pain: Secondary | ICD-10-CM | POA: Diagnosis not present

## 2022-01-06 DIAGNOSIS — A4150 Gram-negative sepsis, unspecified: Secondary | ICD-10-CM | POA: Diagnosis not present

## 2022-01-06 DIAGNOSIS — C221 Intrahepatic bile duct carcinoma: Secondary | ICD-10-CM | POA: Diagnosis not present

## 2022-01-06 DIAGNOSIS — Z434 Encounter for attention to other artificial openings of digestive tract: Secondary | ICD-10-CM | POA: Diagnosis not present

## 2022-01-06 DIAGNOSIS — A4181 Sepsis due to Enterococcus: Secondary | ICD-10-CM | POA: Diagnosis not present

## 2022-01-06 DIAGNOSIS — M545 Low back pain, unspecified: Secondary | ICD-10-CM | POA: Diagnosis not present

## 2022-01-06 DIAGNOSIS — Z96651 Presence of right artificial knee joint: Secondary | ICD-10-CM | POA: Diagnosis not present

## 2022-01-06 DIAGNOSIS — Z9221 Personal history of antineoplastic chemotherapy: Secondary | ICD-10-CM | POA: Diagnosis not present

## 2022-01-06 DIAGNOSIS — R001 Bradycardia, unspecified: Secondary | ICD-10-CM | POA: Diagnosis not present

## 2022-01-07 LAB — AEROBIC/ANAEROBIC CULTURE W GRAM STAIN (SURGICAL/DEEP WOUND)

## 2022-01-08 ENCOUNTER — Other Ambulatory Visit: Payer: Self-pay

## 2022-01-08 ENCOUNTER — Ambulatory Visit
Admission: RE | Admit: 2022-01-08 | Discharge: 2022-01-08 | Disposition: A | Discharge status: Home / Self Care | Payer: Medicare Other | Source: Ambulatory Visit | Attending: Radiation Oncology | Admitting: Radiation Oncology

## 2022-01-08 DIAGNOSIS — Z119 Encounter for screening for infectious and parasitic diseases, unspecified: Secondary | ICD-10-CM | POA: Diagnosis not present

## 2022-01-08 DIAGNOSIS — Z510 Encounter for antineoplastic radiation therapy: Secondary | ICD-10-CM | POA: Diagnosis not present

## 2022-01-08 DIAGNOSIS — R6883 Chills (without fever): Secondary | ICD-10-CM | POA: Diagnosis not present

## 2022-01-08 DIAGNOSIS — C221 Intrahepatic bile duct carcinoma: Secondary | ICD-10-CM | POA: Diagnosis not present

## 2022-01-08 LAB — RAD ONC ARIA SESSION SUMMARY
Course Elapsed Days: 35
Plan Fractions Treated to Date: 22
Plan Prescribed Dose Per Fraction: 1.8 Gy
Plan Total Fractions Prescribed: 25
Plan Total Prescribed Dose: 45 Gy
Reference Point Dosage Given to Date: 39.6 Gy
Reference Point Session Dosage Given: 1.8 Gy
Session Number: 22

## 2022-01-09 ENCOUNTER — Other Ambulatory Visit: Payer: Self-pay

## 2022-01-09 ENCOUNTER — Ambulatory Visit
Admission: RE | Admit: 2022-01-09 | Discharge: 2022-01-09 | Disposition: A | Discharge status: Home / Self Care | Payer: Medicare Other | Source: Ambulatory Visit | Attending: Radiation Oncology | Admitting: Radiation Oncology

## 2022-01-09 ENCOUNTER — Ambulatory Visit: Payer: Medicare Other

## 2022-01-09 ENCOUNTER — Other Ambulatory Visit (HOSPITAL_COMMUNITY): Payer: Self-pay

## 2022-01-09 DIAGNOSIS — C221 Intrahepatic bile duct carcinoma: Secondary | ICD-10-CM | POA: Diagnosis not present

## 2022-01-09 DIAGNOSIS — Z119 Encounter for screening for infectious and parasitic diseases, unspecified: Secondary | ICD-10-CM | POA: Diagnosis not present

## 2022-01-09 DIAGNOSIS — Z510 Encounter for antineoplastic radiation therapy: Secondary | ICD-10-CM | POA: Diagnosis not present

## 2022-01-09 DIAGNOSIS — R6883 Chills (without fever): Secondary | ICD-10-CM | POA: Diagnosis not present

## 2022-01-09 LAB — RAD ONC ARIA SESSION SUMMARY
Course Elapsed Days: 36
Plan Fractions Treated to Date: 23
Plan Prescribed Dose Per Fraction: 1.8 Gy
Plan Total Fractions Prescribed: 25
Plan Total Prescribed Dose: 45 Gy
Reference Point Dosage Given to Date: 41.4 Gy
Reference Point Session Dosage Given: 1.8 Gy
Session Number: 23

## 2022-01-10 ENCOUNTER — Inpatient Hospital Stay: Payer: Medicare Other

## 2022-01-10 ENCOUNTER — Ambulatory Visit: Payer: Medicare Other

## 2022-01-10 ENCOUNTER — Ambulatory Visit: Payer: Medicare Other | Admitting: Hematology

## 2022-01-10 ENCOUNTER — Other Ambulatory Visit: Payer: Self-pay

## 2022-01-10 ENCOUNTER — Ambulatory Visit
Admission: RE | Admit: 2022-01-10 | Discharge: 2022-01-10 | Disposition: A | Discharge status: Home / Self Care | Payer: Medicare Other | Source: Ambulatory Visit | Attending: Radiation Oncology | Admitting: Radiation Oncology

## 2022-01-10 DIAGNOSIS — C221 Intrahepatic bile duct carcinoma: Secondary | ICD-10-CM

## 2022-01-10 DIAGNOSIS — Z510 Encounter for antineoplastic radiation therapy: Secondary | ICD-10-CM | POA: Diagnosis not present

## 2022-01-10 DIAGNOSIS — Z119 Encounter for screening for infectious and parasitic diseases, unspecified: Secondary | ICD-10-CM | POA: Diagnosis not present

## 2022-01-10 DIAGNOSIS — R6883 Chills (without fever): Secondary | ICD-10-CM

## 2022-01-10 LAB — CBC WITH DIFFERENTIAL/PLATELET
Abs Immature Granulocytes: 0.03 10*3/uL (ref 0.00–0.07)
Basophils Absolute: 0 10*3/uL (ref 0.0–0.1)
Basophils Relative: 0 %
Eosinophils Absolute: 0.1 10*3/uL (ref 0.0–0.5)
Eosinophils Relative: 2 %
HCT: 38.8 % — ABNORMAL LOW (ref 39.0–52.0)
Hemoglobin: 13.1 g/dL (ref 13.0–17.0)
Immature Granulocytes: 1 %
Lymphocytes Relative: 7 %
Lymphs Abs: 0.3 10*3/uL — ABNORMAL LOW (ref 0.7–4.0)
MCH: 31.9 pg (ref 26.0–34.0)
MCHC: 33.8 g/dL (ref 30.0–36.0)
MCV: 94.4 fL (ref 80.0–100.0)
Monocytes Absolute: 0.6 10*3/uL (ref 0.1–1.0)
Monocytes Relative: 12 %
Neutro Abs: 4 10*3/uL (ref 1.7–7.7)
Neutrophils Relative %: 78 %
Platelets: 250 10*3/uL (ref 150–400)
RBC: 4.11 MIL/uL — ABNORMAL LOW (ref 4.22–5.81)
RDW: 14.1 % (ref 11.5–15.5)
WBC: 5 10*3/uL (ref 4.0–10.5)
nRBC: 0 % (ref 0.0–0.2)

## 2022-01-10 LAB — COMPREHENSIVE METABOLIC PANEL
ALT: 17 U/L (ref 0–44)
AST: 27 U/L (ref 15–41)
Albumin: 3.3 g/dL — ABNORMAL LOW (ref 3.5–5.0)
Alkaline Phosphatase: 85 U/L (ref 38–126)
Anion gap: 5 (ref 5–15)
BUN: 9 mg/dL (ref 8–23)
CO2: 29 mmol/L (ref 22–32)
Calcium: 8.7 mg/dL — ABNORMAL LOW (ref 8.9–10.3)
Chloride: 102 mmol/L (ref 98–111)
Creatinine, Ser: 0.76 mg/dL (ref 0.61–1.24)
GFR, Estimated: >60 mL/min (ref >60)
Glucose, Bld: 133 mg/dL — ABNORMAL HIGH (ref 70–99)
Potassium: 3.7 mmol/L (ref 3.5–5.1)
Sodium: 136 mmol/L (ref 135–145)
Total Bilirubin: 0.6 mg/dL (ref 0.3–1.2)
Total Protein: 6.7 g/dL (ref 6.5–8.1)

## 2022-01-10 LAB — RAD ONC ARIA SESSION SUMMARY
Course Elapsed Days: 37
Plan Fractions Treated to Date: 24
Plan Prescribed Dose Per Fraction: 1.8 Gy
Plan Total Fractions Prescribed: 25
Plan Total Prescribed Dose: 45 Gy
Reference Point Dosage Given to Date: 43.2 Gy
Reference Point Session Dosage Given: 1.8 Gy
Session Number: 24

## 2022-01-10 LAB — URINALYSIS, COMPLETE (UACMP) WITH MICROSCOPIC
Bilirubin Urine: NEGATIVE
Glucose, UA: NEGATIVE mg/dL
Hgb urine dipstick: NEGATIVE
Ketones, ur: NEGATIVE mg/dL
Leukocytes,Ua: NEGATIVE
Nitrite: NEGATIVE
Protein, ur: NEGATIVE mg/dL
Specific Gravity, Urine: 1.011 (ref 1.005–1.030)
pH: 6 (ref 5.0–8.0)

## 2022-01-11 ENCOUNTER — Other Ambulatory Visit: Payer: Self-pay

## 2022-01-11 ENCOUNTER — Ambulatory Visit: Payer: Medicare Other

## 2022-01-11 ENCOUNTER — Ambulatory Visit
Admission: RE | Admit: 2022-01-11 | Discharge: 2022-01-11 | Disposition: A | Discharge status: Home / Self Care | Payer: Medicare Other | Source: Ambulatory Visit | Attending: Radiation Oncology | Admitting: Radiation Oncology

## 2022-01-11 DIAGNOSIS — C221 Intrahepatic bile duct carcinoma: Secondary | ICD-10-CM | POA: Diagnosis not present

## 2022-01-11 DIAGNOSIS — A4181 Sepsis due to Enterococcus: Secondary | ICD-10-CM | POA: Diagnosis not present

## 2022-01-11 DIAGNOSIS — R6883 Chills (without fever): Secondary | ICD-10-CM | POA: Diagnosis not present

## 2022-01-11 DIAGNOSIS — Z434 Encounter for attention to other artificial openings of digestive tract: Secondary | ICD-10-CM | POA: Diagnosis not present

## 2022-01-11 DIAGNOSIS — K810 Acute cholecystitis: Secondary | ICD-10-CM | POA: Diagnosis not present

## 2022-01-11 DIAGNOSIS — Z119 Encounter for screening for infectious and parasitic diseases, unspecified: Secondary | ICD-10-CM | POA: Diagnosis not present

## 2022-01-11 DIAGNOSIS — A4150 Gram-negative sepsis, unspecified: Secondary | ICD-10-CM | POA: Diagnosis not present

## 2022-01-11 DIAGNOSIS — R001 Bradycardia, unspecified: Secondary | ICD-10-CM | POA: Diagnosis not present

## 2022-01-11 DIAGNOSIS — Z510 Encounter for antineoplastic radiation therapy: Secondary | ICD-10-CM | POA: Diagnosis not present

## 2022-01-11 LAB — RAD ONC ARIA SESSION SUMMARY
Course Elapsed Days: 38
Plan Fractions Treated to Date: 25
Plan Prescribed Dose Per Fraction: 1.8 Gy
Plan Total Fractions Prescribed: 25
Plan Total Prescribed Dose: 45 Gy
Reference Point Dosage Given to Date: 45 Gy
Reference Point Session Dosage Given: 1.8 Gy
Session Number: 25

## 2022-01-11 LAB — URINE CULTURE: Culture: NO GROWTH

## 2022-01-12 ENCOUNTER — Ambulatory Visit
Admission: RE | Admit: 2022-01-12 | Discharge: 2022-01-12 | Disposition: A | Discharge status: Home / Self Care | Payer: Medicare Other | Source: Ambulatory Visit | Attending: Radiation Oncology | Admitting: Radiation Oncology

## 2022-01-12 ENCOUNTER — Ambulatory Visit: Payer: Medicare Other

## 2022-01-12 ENCOUNTER — Other Ambulatory Visit: Payer: Self-pay

## 2022-01-12 DIAGNOSIS — Z119 Encounter for screening for infectious and parasitic diseases, unspecified: Secondary | ICD-10-CM | POA: Diagnosis not present

## 2022-01-12 DIAGNOSIS — R6883 Chills (without fever): Secondary | ICD-10-CM | POA: Diagnosis not present

## 2022-01-12 DIAGNOSIS — C221 Intrahepatic bile duct carcinoma: Secondary | ICD-10-CM | POA: Diagnosis not present

## 2022-01-12 DIAGNOSIS — Z510 Encounter for antineoplastic radiation therapy: Secondary | ICD-10-CM | POA: Diagnosis not present

## 2022-01-12 LAB — RAD ONC ARIA SESSION SUMMARY
Course Elapsed Days: 39
Plan Fractions Treated to Date: 1
Plan Prescribed Dose Per Fraction: 1.8 Gy
Plan Total Fractions Prescribed: 3
Plan Total Prescribed Dose: 5.4 Gy
Reference Point Dosage Given to Date: 1.8 Gy
Reference Point Session Dosage Given: 1.8 Gy
Session Number: 26

## 2022-01-12 LAB — CANCER ANTIGEN 19-9: CA 19-9: 29 U/mL (ref 0–35)

## 2022-01-15 ENCOUNTER — Ambulatory Visit
Admission: RE | Admit: 2022-01-15 | Discharge: 2022-01-15 | Disposition: A | Discharge status: Home / Self Care | Payer: Medicare Other | Source: Ambulatory Visit | Attending: Radiation Oncology | Admitting: Radiation Oncology

## 2022-01-15 ENCOUNTER — Ambulatory Visit: Payer: Medicare Other

## 2022-01-15 ENCOUNTER — Other Ambulatory Visit: Payer: Self-pay

## 2022-01-15 DIAGNOSIS — C221 Intrahepatic bile duct carcinoma: Secondary | ICD-10-CM | POA: Diagnosis not present

## 2022-01-15 DIAGNOSIS — A4150 Gram-negative sepsis, unspecified: Secondary | ICD-10-CM | POA: Diagnosis not present

## 2022-01-15 DIAGNOSIS — Z434 Encounter for attention to other artificial openings of digestive tract: Secondary | ICD-10-CM | POA: Diagnosis not present

## 2022-01-15 DIAGNOSIS — R001 Bradycardia, unspecified: Secondary | ICD-10-CM | POA: Diagnosis not present

## 2022-01-15 DIAGNOSIS — Z119 Encounter for screening for infectious and parasitic diseases, unspecified: Secondary | ICD-10-CM | POA: Diagnosis not present

## 2022-01-15 DIAGNOSIS — A4181 Sepsis due to Enterococcus: Secondary | ICD-10-CM | POA: Diagnosis not present

## 2022-01-15 DIAGNOSIS — R6883 Chills (without fever): Secondary | ICD-10-CM | POA: Diagnosis not present

## 2022-01-15 DIAGNOSIS — Z510 Encounter for antineoplastic radiation therapy: Secondary | ICD-10-CM | POA: Diagnosis not present

## 2022-01-15 DIAGNOSIS — K810 Acute cholecystitis: Secondary | ICD-10-CM | POA: Diagnosis not present

## 2022-01-15 LAB — RAD ONC ARIA SESSION SUMMARY
Course Elapsed Days: 42
Plan Fractions Treated to Date: 2
Plan Prescribed Dose Per Fraction: 1.8 Gy
Plan Total Fractions Prescribed: 3
Plan Total Prescribed Dose: 5.4 Gy
Reference Point Dosage Given to Date: 3.6 Gy
Reference Point Session Dosage Given: 1.8 Gy
Session Number: 27

## 2022-01-16 ENCOUNTER — Encounter: Payer: Self-pay | Admitting: Radiation Oncology

## 2022-01-16 ENCOUNTER — Other Ambulatory Visit: Payer: Self-pay

## 2022-01-16 ENCOUNTER — Ambulatory Visit
Admission: RE | Admit: 2022-01-16 | Discharge: 2022-01-16 | Disposition: A | Discharge status: Home / Self Care | Payer: Medicare Other | Source: Ambulatory Visit | Attending: Radiation Oncology | Admitting: Radiation Oncology

## 2022-01-16 DIAGNOSIS — R6883 Chills (without fever): Secondary | ICD-10-CM | POA: Diagnosis not present

## 2022-01-16 DIAGNOSIS — Z510 Encounter for antineoplastic radiation therapy: Secondary | ICD-10-CM | POA: Diagnosis not present

## 2022-01-16 DIAGNOSIS — Z119 Encounter for screening for infectious and parasitic diseases, unspecified: Secondary | ICD-10-CM | POA: Diagnosis not present

## 2022-01-16 DIAGNOSIS — C221 Intrahepatic bile duct carcinoma: Secondary | ICD-10-CM | POA: Diagnosis not present

## 2022-01-16 LAB — RAD ONC ARIA SESSION SUMMARY
Course Elapsed Days: 43
Plan Fractions Treated to Date: 3
Plan Prescribed Dose Per Fraction: 1.8 Gy
Plan Total Fractions Prescribed: 3
Plan Total Prescribed Dose: 5.4 Gy
Reference Point Dosage Given to Date: 5.4 Gy
Reference Point Session Dosage Given: 1.8 Gy
Session Number: 28

## 2022-01-16 NOTE — Progress Notes (Signed)
                                                                                                                                                            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 He did have fatigue during therapy but did not have nausea or difficulty eating.  Plan: The patient will receive a call in about one month from the radiation oncology department. He will continue follow up with Dr. Burr Medico as well.   ________________________________________________    Carola Rhine, [facility]

## 2022-01-17 NOTE — Progress Notes (Deleted)
HPI: Follow-up aortic stenosis.  Abdominal ultrasound January 2019 showed no aneurysm.  Last echocardiogram January 2023 showed ejection fraction 60 to 65%, moderate aortic stenosis with mean gradient 21 mmHg and mildly dilated ascending aorta at 39 mm.  Patient has been diagnosed with cholangiocarcinoma and undergoing therapy.  Since last seen   Current Outpatient Medications  Medication Sig Dispense Refill   acetaminophen (TYLENOL) 500 MG tablet Take 1,000 mg by mouth daily as needed (pain).     lactulose (CHRONULAC) 10 GM/15ML solution Take 30 mLs (20 g total) by mouth 2 (two) times daily as needed for mild constipation or moderate constipation. (Patient taking differently: Take by mouth daily.) 473 mL 0   ondansetron (ZOFRAN) 8 MG tablet Take 1 tablet (8 mg total) by mouth every 8 (eight) hours as needed for nausea or vomiting. (Patient not taking: Reported on 01/02/2022) 20 tablet 0   traMADol (ULTRAM) 50 MG tablet Take 1 tablet (50 mg total) by mouth every 6 (six) hours as needed for moderate pain. 20 tablet 0   No current facility-administered medications for this visit.     Past Medical History:  Diagnosis Date   Arthritis    Back pain    Heart murmur    History of kidney stones    ICH (intracerebral hemorrhage) ([place])     Past Surgical History:  Procedure Laterality Date   BACK SURGERY     BILIARY BRUSHING  10/11/2021   Procedure: BILIARY BRUSHING;  Surgeon: Ladene Artist, MD;  Location: [facility];  Service: Gastroenterology;;   BILIARY BRUSHING  11/21/2021   Procedure: BILIARY BRUSHING;  Surgeon: Clarene Essex, MD;  Location: [facility];  Service: Gastroenterology;;   BILIARY STENT PLACEMENT N/A 10/11/2021   Procedure: BILIARY STENT PLACEMENT;  Surgeon: Ladene Artist, MD;  Location: [facility];  Service: Gastroenterology;  Laterality: N/A;   BILIARY STENT PLACEMENT N/A 11/21/2021   Procedure: BILIARY STENT PLACEMENT;  Surgeon: Clarene Essex, MD;  Location: [facility];  Service: Gastroenterology;  Laterality: N/A;   BIOPSY  11/21/2021   Procedure: BIOPSY;  Surgeon: Clarene Essex, MD;  [facility]: [facility];  Service: Gastroenterology;;   ERCP N/A 10/11/2021   Procedure: ENDOSCOPIC RETROGRADE CHOLANGIOPANCREATOGRAPHY (ERCP);  Surgeon: Ladene Artist, MD;  Location: [facility];  Service: Gastroenterology;  Laterality: N/A;   ERCP N/A 11/21/2021   Procedure: ENDOSCOPIC RETROGRADE CHOLANGIOPANCREATOGRAPHY (ERCP);  Surgeon: Clarene Essex, MD;  Location: [facility];  Service: Gastroenterology;  Laterality: N/A;  with Spyglass   ESOPHAGOGASTRODUODENOSCOPY (EGD) WITH PROPOFOL N/A 10/11/2021   Procedure: ESOPHAGOGASTRODUODENOSCOPY (EGD) WITH PROPOFOL;  Surgeon: Arta Silence, MD;  [facility]: [facility];  Service: Gastroenterology;  Laterality: N/A;   EUS N/A 10/11/2021   Procedure: UPPER ENDOSCOPIC ULTRASOUND (EUS) LINEAR;  Surgeon: Arta Silence, MD;  Location: [facility];  Service: Gastroenterology;  Laterality: N/A;   FINE NEEDLE ASPIRATION N/A 10/11/2021   Procedure: FINE NEEDLE ASPIRATION (FNA) LINEAR;  Surgeon: Arta Silence, MD;  [facility]: [facility];  Service: Gastroenterology;  Laterality: N/A;   INGUINAL HERNIA REPAIR     IR PERC CHOLECYSTOSTOMY  01/02/2022   KNEE ARTHROPLASTY Right 05/16/2017   Procedure: RIGHT TOTAL KNEE ARTHROPLASTY WITH COMPUTER NAVIGATION;  Surgeon: Rod Can, MD;  Location: [facility];  Service: Orthopedics;  Laterality: Right;  Needs RNFA   NECK SURGERY     SPHINCTEROTOMY  10/11/2021   Procedure: SPHINCTEROTOMY;  Surgeon: Ladene Artist, MD;  Location: [facility];  Service: Gastroenterology;;   [name]  11/21/2021  Procedure: SPHINCTEROTOMY;  Surgeon: Clarene Essex, MD;  Location: [facility];  Service: Gastroenterology;;   [name] CHOLANGIOSCOPY N/A 11/21/2021   Procedure: LZJQBHAL CHOLANGIOSCOPY;  Surgeon: Clarene Essex, MD;  [facility]: [facility];  Service: Gastroenterology;  Laterality: N/A;   STENT  REMOVAL  11/21/2021   Procedure: STENT REMOVAL;  Surgeon: Clarene Essex, MD;  Location: [facility];  Service: Gastroenterology;;   TONSILLECTOMY      Social History   Socioeconomic History   Marital status: Married    Spouse name: Not on file   Number of children: 3   Years of education: Not on file   Highest education level: Not on file  Occupational History    Comment: Retired  Tobacco Use   Smoking status: Former   Smokeless tobacco: Never  Scientific laboratory technician Use: Never used  Substance and Sexual Activity   Alcohol use: Yes    Comment: one beer per day   Drug use: No   Sexual activity: Not on file  Other Topics Concern   Not on file  Social History Narrative   Not on file   Social Determinants of Health   Financial Resource Strain: Not on file  Food Insecurity: Not on file  Transportation Needs: Not on file  Physical Activity: Not on file  Stress: Not on file  Social Connections: Not on file  Intimate Partner Violence: Not on file    Family History  Problem Relation Age of Onset   Diabetes Mother     ROS: no fevers or chills, productive cough, hemoptysis, dysphasia, odynophagia, melena, hematochezia, dysuria, hematuria, rash, seizure activity, orthopnea, PND, pedal edema, claudication. Remaining systems are negative.  Physical Exam: Well-developed well-nourished in no acute distress.  Skin is warm and dry.  HEENT is normal.  Neck is supple.  Chest is clear to auscultation with normal expansion.  Cardiovascular exam is regular rate and rhythm.  Abdominal exam nontender or distended. No masses palpated. Extremities show no edema. neuro grossly intact  ECG- personally reviewed  A/P  1 aortic stenosis-moderate on most recent echocardiogram.  We will plan to repeat study January 2024.  Note he has been diagnosed with cholangiocarcinoma and may not be a candidate for aortic valve replacement in the future.  Kirk Ruths, MD

## 2022-01-22 ENCOUNTER — Ambulatory Visit (HOSPITAL_COMMUNITY)
Admission: RE | Admit: 2022-01-22 | Discharge: 2022-01-22 | Disposition: A | Discharge status: Home / Self Care | Payer: Medicare Other | Source: Ambulatory Visit | Attending: Radiology | Admitting: Radiology

## 2022-01-22 ENCOUNTER — Other Ambulatory Visit (HOSPITAL_COMMUNITY): Payer: Self-pay | Admitting: Radiology

## 2022-01-22 DIAGNOSIS — C221 Intrahepatic bile duct carcinoma: Secondary | ICD-10-CM | POA: Diagnosis not present

## 2022-01-22 DIAGNOSIS — Z434 Encounter for attention to other artificial openings of digestive tract: Secondary | ICD-10-CM | POA: Diagnosis not present

## 2022-01-22 DIAGNOSIS — K810 Acute cholecystitis: Secondary | ICD-10-CM

## 2022-01-22 HISTORY — PX: IR EXCHANGE BILIARY DRAIN: IMG6046

## 2022-01-22 MED ORDER — LIDOCAINE HCL 1 % IJ SOLN
INTRAMUSCULAR | Status: DC | PRN
  Administered 2022-01-22: 3 mL via INTRADERMAL

## 2022-01-22 MED ORDER — LIDOCAINE HCL 1 % IJ SOLN
INTRAMUSCULAR | Status: AC
  Filled 2022-01-22: qty 20

## 2022-01-22 MED ORDER — IOHEXOL 300 MG/ML  SOLN
50.0000 mL | Freq: Once | INTRAMUSCULAR | Status: AC | PRN
  Administered 2022-01-22: 5 mL

## 2022-01-24 DIAGNOSIS — K819 Cholecystitis, unspecified: Secondary | ICD-10-CM | POA: Diagnosis not present

## 2022-01-24 DIAGNOSIS — R001 Bradycardia, unspecified: Secondary | ICD-10-CM | POA: Diagnosis not present

## 2022-01-24 DIAGNOSIS — K831 Obstruction of bile duct: Secondary | ICD-10-CM | POA: Diagnosis not present

## 2022-01-24 DIAGNOSIS — Z434 Encounter for attention to other artificial openings of digestive tract: Secondary | ICD-10-CM | POA: Diagnosis not present

## 2022-01-24 DIAGNOSIS — C221 Intrahepatic bile duct carcinoma: Secondary | ICD-10-CM | POA: Diagnosis not present

## 2022-01-24 DIAGNOSIS — K810 Acute cholecystitis: Secondary | ICD-10-CM | POA: Diagnosis not present

## 2022-01-24 DIAGNOSIS — A4181 Sepsis due to Enterococcus: Secondary | ICD-10-CM | POA: Diagnosis not present

## 2022-01-24 DIAGNOSIS — A4150 Gram-negative sepsis, unspecified: Secondary | ICD-10-CM | POA: Diagnosis not present

## 2022-01-26 ENCOUNTER — Other Ambulatory Visit (HOSPITAL_COMMUNITY): Payer: Self-pay | Admitting: Interventional Radiology

## 2022-01-26 ENCOUNTER — Ambulatory Visit (HOSPITAL_COMMUNITY)
Admission: RE | Admit: 2022-01-26 | Discharge: 2022-01-26 | Disposition: A | Discharge status: Home / Self Care | Payer: Medicare Other | Source: Ambulatory Visit | Attending: Interventional Radiology | Admitting: Interventional Radiology

## 2022-01-26 DIAGNOSIS — C221 Intrahepatic bile duct carcinoma: Secondary | ICD-10-CM

## 2022-01-26 DIAGNOSIS — Z434 Encounter for attention to other artificial openings of digestive tract: Secondary | ICD-10-CM | POA: Diagnosis not present

## 2022-01-26 HISTORY — PX: IR EXCHANGE BILIARY DRAIN: IMG6046

## 2022-01-26 MED ORDER — LIDOCAINE HCL 1 % IJ SOLN
INTRAMUSCULAR | Status: AC
  Filled 2022-01-26: qty 20

## 2022-01-26 MED ORDER — IOHEXOL 300 MG/ML  SOLN
50.0000 mL | Freq: Once | INTRAMUSCULAR | Status: AC | PRN
  Administered 2022-01-26: 20 mL

## 2022-01-26 MED ORDER — LIDOCAINE HCL 1 % IJ SOLN
INTRAMUSCULAR | Status: DC | PRN
  Administered 2022-01-26: 5 mL via INTRADERMAL

## 2022-01-26 NOTE — Procedures (Signed)
Interventional Radiology Procedure Note  Procedure:   Image guided perc drain exchange, as the patient's wife is describing some "leakage".   New 70F drain placed.   Findings: The GB is decompressed, collapsed.  No cystic duct opacification.   Complications: None Recommendations:  - Recommend stop flushing.  Continue gravity drainage. - Follow up on schedule for drain exchange, which is scheduled 1 day before next surgery appt in November. - Do not submerge - Routine care   Signed,  Dulcy Fanny. Earleen Newport, DO

## 2022-01-30 ENCOUNTER — Ambulatory Visit: Payer: Medicare Other | Admitting: Cardiology

## 2022-01-30 DIAGNOSIS — Z434 Encounter for attention to other artificial openings of digestive tract: Secondary | ICD-10-CM | POA: Diagnosis not present

## 2022-01-30 DIAGNOSIS — C221 Intrahepatic bile duct carcinoma: Secondary | ICD-10-CM | POA: Diagnosis not present

## 2022-01-30 DIAGNOSIS — R001 Bradycardia, unspecified: Secondary | ICD-10-CM | POA: Diagnosis not present

## 2022-01-30 DIAGNOSIS — K810 Acute cholecystitis: Secondary | ICD-10-CM | POA: Diagnosis not present

## 2022-01-30 DIAGNOSIS — A4181 Sepsis due to Enterococcus: Secondary | ICD-10-CM | POA: Diagnosis not present

## 2022-01-30 DIAGNOSIS — A4150 Gram-negative sepsis, unspecified: Secondary | ICD-10-CM | POA: Diagnosis not present

## 2022-02-02 ENCOUNTER — Inpatient Hospital Stay: Payer: Medicare Other

## 2022-02-02 ENCOUNTER — Inpatient Hospital Stay: Payer: Medicare Other | Attending: Physician Assistant | Admitting: Hematology

## 2022-02-02 ENCOUNTER — Encounter: Payer: Self-pay | Admitting: Hematology

## 2022-02-02 ENCOUNTER — Other Ambulatory Visit: Payer: Self-pay

## 2022-02-02 VITALS — BP 122/78 | HR 60 | Temp 98.0°F | Resp 16 | Wt 160.9 lb

## 2022-02-02 DIAGNOSIS — C221 Intrahepatic bile duct carcinoma: Secondary | ICD-10-CM | POA: Diagnosis not present

## 2022-02-02 LAB — COMPREHENSIVE METABOLIC PANEL
ALT: 15 U/L (ref 0–44)
AST: 22 U/L (ref 15–41)
Albumin: 3.4 g/dL — ABNORMAL LOW (ref 3.5–5.0)
Alkaline Phosphatase: 95 U/L (ref 38–126)
Anion gap: 3 — ABNORMAL LOW (ref 5–15)
BUN: 9 mg/dL (ref 8–23)
CO2: 33 mmol/L — ABNORMAL HIGH (ref 22–32)
Calcium: 8.9 mg/dL (ref 8.9–10.3)
Chloride: 102 mmol/L (ref 98–111)
Creatinine, Ser: 0.83 mg/dL (ref 0.61–1.24)
GFR, Estimated: >60 mL/min (ref >60)
Glucose, Bld: 110 mg/dL — ABNORMAL HIGH (ref 70–99)
Potassium: 4.2 mmol/L (ref 3.5–5.1)
Sodium: 138 mmol/L (ref 135–145)
Total Bilirubin: 0.5 mg/dL (ref 0.3–1.2)
Total Protein: 6.9 g/dL (ref 6.5–8.1)

## 2022-02-02 LAB — CBC WITH DIFFERENTIAL/PLATELET
Abs Immature Granulocytes: 0.02 10*3/uL (ref 0.00–0.07)
Basophils Absolute: 0 10*3/uL (ref 0.0–0.1)
Basophils Relative: 0 %
Eosinophils Absolute: 0.1 10*3/uL (ref 0.0–0.5)
Eosinophils Relative: 2 %
HCT: 36.6 % — ABNORMAL LOW (ref 39.0–52.0)
Hemoglobin: 12.2 g/dL — ABNORMAL LOW (ref 13.0–17.0)
Immature Granulocytes: 0 %
Lymphocytes Relative: 7 %
Lymphs Abs: 0.5 10*3/uL — ABNORMAL LOW (ref 0.7–4.0)
MCH: 32.1 pg (ref 26.0–34.0)
MCHC: 33.3 g/dL (ref 30.0–36.0)
MCV: 96.3 fL (ref 80.0–100.0)
Monocytes Absolute: 0.9 10*3/uL (ref 0.1–1.0)
Monocytes Relative: 14 %
Neutro Abs: 4.9 10*3/uL (ref 1.7–7.7)
Neutrophils Relative %: 77 %
Platelets: 172 10*3/uL (ref 150–400)
RBC: 3.8 MIL/uL — ABNORMAL LOW (ref 4.22–5.81)
RDW: 15.6 % — ABNORMAL HIGH (ref 11.5–15.5)
WBC: 6.3 10*3/uL (ref 4.0–10.5)
nRBC: 0 % (ref 0.0–0.2)

## 2022-02-02 NOTE — Progress Notes (Addendum)
[place]   [phone] [phone]   Clinic Follow up Note   Patient Care Team: London Pepper, MD as PCP - General (Family Medicine) Stanford Breed Denice Bors, MD as PCP - Cardiology (Cardiology) Lelon Perla, MD as Consulting Physician (Cardiology)  Date of Service:  02/02/2022  CHIEF COMPLAINT: f/u of suspected cholangiocarcinoma  CURRENT THERAPY:  Surveillance  ASSESSMENT & PLAN:  Patrick Collier is a 81 y.o. male with   1. 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 CT AP 10/06/21 showed biliary obstruction, he was referred to ED. -baseline CA 19-9 on 10/06/21 elevated to 129. -s/p ERCP 10/11/21 inpatient showed a mass in common bile duct, cytology not diagnostic showing only suspicious atypical cells. S/p stent placement  -staging chest CT 11/03/21 was negative. -he met Dr. Zenia Resides on 11/08/21 and was offered surgery, but pt declined. -repeat ERCP on 11/21/21 for stent change, path and cytology were again non-diagnostic but suspicious. -he received radiation 12/04/21 - 01/16/22. He tried concurrent Xeloda for a week but tolerated poorly with nausea, weight loss, and rash to his hands and face, discontinued 8/22.  -last CT AP was performed during hospitalization for sepsis and gallbladder abscess on 01/01/22. -labs reviewed, overall stable. -We discussed cancer surveillance, we will repeat a CT scan every 3 to 6 months.  We discussed that his cancer is probably not cured by radiation, he is at high risk for cancer progression and metastasis.  His wife had multiple questions about that, I answered to her satisfaction.   2. Symptom Management: Nausea, Weight loss -his baseline weight was 187 lbs in 06/2021; he was down to 162 lbs on 12/12/21. Weight is stable since that time.     PLAN: -lab on 11/13 prior to IR appointment -Lab and f/u in 3 months   No problem-specific Assessment &  Plan notes found for this encounter.   SUMMARY OF ONCOLOGIC HISTORY: Oncology History  Cholangiocarcinoma ([place])  10/07/2021 Imaging   MR ABDOMEN MRCP W WO CONTAST  IMPRESSION: 1. Severe biliary ductal dilation of both the intra and extrahepatic bile ducts with abrupt cutoff seen at the distal common bile duct. Findings are concerning for stricture or obstructing tumor. No obstructing mass is seen, although motion artifact on contrast-enhanced imaging limits evaluation. Recommend correlation with ERCP. 2. Small cystic lesions of the pancreatic tail, largest measures 10 mm. Recommend follow up pre and post contrast MRI/MRCP or pancreatic protocol CT in 2 years. This recommendation follows ACR consensus guidelines: Management of Incidental Pancreatic Cysts: A White Paper of the ACR Incidental Findings Committee. J Am Coll Radiol 2725;36:644-034. 3. Trace perihepatic ascites and trace bilateral pleural effusions.   10/11/2021 Procedure   ERCP-Dr. Fuller Plan  Impression:  -I single localized malignant appearing severe biliary stricture was found in the lower third of the main bile duct. -The proximal biliary tree was dilated, secondary to stricture -Biliary sphincterotomy was performed -Cells for cytology obtained in the lower third of the main duct -One plastic stent was placed into the common bile duct.     10/11/2021 Pathology Results   CYTOLOGY - NON PAP  CASE: [id number]  PATIENT: Patrick Collier  Non-Gynecological Cytology Report   FINAL MICROSCOPIC DIAGNOSIS:  A. COMMON BILE DUCT, LESION, FINE NEEDLE ASPIRATION:  - No malignant cells seen  -Scant cellularity; benign/reactive ductal cells and ductal epithelium  with acute inflammatory cells   B. COMMON BILE DUCT, STRICTURE, BRUSHING:  - Atypical cells suspicious for  tumor present    10/26/2021 Initial Diagnosis   Cholangiocarcinoma ([place])    Procedure   Upper EUS-Dr. Paulita Fujita  Impression:  - There was no sign of  significant pathology in the ampulla. - A mass was found in the common bile duct. Fine needle aspiration performed. - There was no sign of significant pathology in the pancreatic head, genu of the pancreas and pancreatic body. - A few abnormal lymph nodes were visualized in the peripancreatic region and porta hepatis region. - There was dilation in the middle third of the main bile duct, in the upper third of the main bile duct and in the common hepatic duct which measured up to 15 mm. - Small perihepatic ascites. - Overall constellation findings most consistent mid common bile duct cholangiocarcinoma.   11/21/2021 Cancer Staging   Staging form: Distal Bile Duct, AJCC 8th Edition - Clinical stage from 11/21/2021: Stage Unknown (cTX, cN0, cM0) - Signed by Truitt Merle, MD on 12/12/2021 Total positive nodes: 0      INTERVAL HISTORY:  Patrick Collier is here for a follow up of suspected cholangiocarcinoma. He was last seen by me on 01/03/22 while he was in the hospital. He presents to the clinic accompanied by his wife. They report he has improved since completing radiation-- he is eating better, energy has returned.   All other systems were reviewed with the patient and are negative.  MEDICAL HISTORY:  Past Medical History:  Diagnosis Date   Arthritis    Back pain    Heart murmur    History of kidney stones    ICH (intracerebral hemorrhage) ([place])     SURGICAL HISTORY: Past Surgical History:  Procedure Laterality Date   BACK SURGERY     BILIARY BRUSHING  10/11/2021   Procedure: BILIARY BRUSHING;  Surgeon: Ladene Artist, MD;  Location: [facility];  Service: Gastroenterology;;   BILIARY BRUSHING  11/21/2021   Procedure: BILIARY BRUSHING;  Surgeon: Clarene Essex, MD;  Location: [facility];  Service: Gastroenterology;;   BILIARY STENT PLACEMENT N/A 10/11/2021   Procedure: BILIARY STENT PLACEMENT;  Surgeon: Ladene Artist, MD;  Location: [facility];  Service: Gastroenterology;   Laterality: N/A;   BILIARY STENT PLACEMENT N/A 11/21/2021   Procedure: BILIARY STENT PLACEMENT;  Surgeon: Clarene Essex, MD;  [facility];  Service: Gastroenterology;  Laterality: N/A;   BIOPSY  11/21/2021   Procedure: BIOPSY;  Surgeon: Clarene Essex, MD;  Location: [facility];  Service: Gastroenterology;;   ERCP N/A 10/11/2021   Procedure: ENDOSCOPIC RETROGRADE CHOLANGIOPANCREATOGRAPHY (ERCP);  Surgeon: Ladene Artist, MD;  Location: [facility];  Service: Gastroenterology;  Laterality: N/A;   ERCP N/A 11/21/2021   Procedure: ENDOSCOPIC RETROGRADE CHOLANGIOPANCREATOGRAPHY (ERCP);  Surgeon: Clarene Essex, MD;  Location: [facility];  Service: Gastroenterology;  Laterality: N/A;  with Spyglass   ESOPHAGOGASTRODUODENOSCOPY (EGD) WITH PROPOFOL N/A 10/11/2021   Procedure: ESOPHAGOGASTRODUODENOSCOPY (EGD) WITH PROPOFOL;  Surgeon: Arta Silence, MD;  [facility]: [facility];  Service: Gastroenterology;  Laterality: N/A;   EUS N/A 10/11/2021   Procedure: UPPER ENDOSCOPIC ULTRASOUND (EUS) LINEAR;  Surgeon: Arta Silence, MD;  Location: [facility];  Service: Gastroenterology;  Laterality: N/A;   FINE NEEDLE ASPIRATION N/A 10/11/2021   Procedure: FINE NEEDLE ASPIRATION (FNA) LINEAR;  Surgeon: Arta Silence, MD;  Location: [facility];  Service: Gastroenterology;  Laterality: N/A;   INGUINAL HERNIA REPAIR     IR EXCHANGE BILIARY DRAIN  01/22/2022   IR EXCHANGE BILIARY DRAIN  01/26/2022   IR PERC CHOLECYSTOSTOMY  01/02/2022  KNEE ARTHROPLASTY Right 05/16/2017   Procedure: RIGHT TOTAL KNEE ARTHROPLASTY WITH COMPUTER NAVIGATION;  Surgeon: Rod Can, MD;  Location: [facility];  Service: Orthopedics;  Laterality: Right;  Needs RNFA   NECK SURGERY     SPHINCTEROTOMY  10/11/2021   Procedure: SPHINCTEROTOMY;  Surgeon: Ladene Artist, MD;  Location: [facility];  Service: Gastroenterology;;   [name]  11/21/2021   Procedure: [name];  Surgeon: Clarene Essex, MD;  Location: [facility];   Service: Gastroenterology;;   [name] CHOLANGIOSCOPY N/A 11/21/2021   Procedure: CBJSEGBT CHOLANGIOSCOPY;  Surgeon: Clarene Essex, MD;  [facility]: [facility];  Service: Gastroenterology;  Laterality: N/A;   STENT REMOVAL  11/21/2021   Procedure: STENT REMOVAL;  Surgeon: Clarene Essex, MD;  Location: [facility];  Service: Gastroenterology;;   TONSILLECTOMY      I have reviewed the social history and family history with the patient and they are unchanged from previous note.  ALLERGIES:  has No Known Allergies.  MEDICATIONS:  Current Outpatient Medications  Medication Sig Dispense Refill   acetaminophen (TYLENOL) 500 MG tablet Take 1,000 mg by mouth daily as needed (pain).     No current facility-administered medications for this visit.    PHYSICAL EXAMINATION: ECOG PERFORMANCE STATUS: 1 - Symptomatic but completely ambulatory  Vitals:   02/02/22 1303  BP: 122/78  Pulse: 60  Resp: 16  Temp: 98 F (36.7 C)  SpO2: 98%   Wt Readings from Last 3 Encounters:  02/02/22 160 lb 14.4 oz (73 kg)  01/01/22 162 lb 11.2 oz (73.8 kg)  12/18/21 162 lb (73.5 kg)     GENERAL:alert, no distress and comfortable SKIN: skin color normal, no rashes or significant lesions EYES: normal, Conjunctiva are pink and non-injected, sclera clear  NEURO: alert & oriented x 3 with fluent speech  LABORATORY DATA:  I have reviewed the data as listed    Latest Ref Rng & Units 02/02/2022   12:54 PM 01/10/2022   10:40 AM 01/03/2022    4:37 AM  CBC  WBC 4.0 - 10.5 K/uL 6.3  5.0  8.3   Hemoglobin 13.0 - 17.0 g/dL 12.2  13.1  11.5   Hematocrit 39.0 - 52.0 % 36.6  38.8  34.6   Platelets 150 - 400 K/uL 172  250  147         Latest Ref Rng & Units 02/02/2022   12:54 PM 01/10/2022   10:40 AM 01/03/2022    4:37 AM  CMP  Glucose 70 - 99 mg/dL 110  133  117   BUN 8 - 23 mg/dL _0 Creatinine 0.61 - 1.24 mg/dL 0.83  0.76  0.78   Sodium 135 - 145 mmol/L 138  136  135   Potassium 3.5 - 5.1 mmol/L 4.2  3.7   3.6   Chloride 98 - 111 mmol/L 102  102  102   CO2 22 - 32 mmol/L 33  29  27   Calcium 8.9 - 10.3 mg/dL 8.9  8.7  8.0   Total Protein 6.5 - 8.1 g/dL 6.9  6.7  6.1   Total Bilirubin 0.3 - 1.2 mg/dL 0.5  0.6  0.7   Alkaline Phos 38 - 126 U/L 95  85  65   AST 15 - 41 U/L _1 ALT 0 - 44 U/L _2 RADIOGRAPHIC STUDIES: I have personally reviewed the radiological images as listed and  agreed with the findings in the report. No results found.    No orders of the defined types were placed in this encounter.  All questions were answered. The patient knows to call the clinic with any problems, questions or concerns. No barriers to learning was detected. The total time spent in the appointment was 30 minutes.     Truitt Merle, MD 02/02/2022   I, Wilburn Mylar, am acting as scribe for Truitt Merle, MD.   I have reviewed the above documentation for accuracy and completeness, and I agree with the above.

## 2022-02-05 ENCOUNTER — Telehealth: Payer: Self-pay | Admitting: Hematology

## 2022-02-05 DIAGNOSIS — Z9221 Personal history of antineoplastic chemotherapy: Secondary | ICD-10-CM | POA: Diagnosis not present

## 2022-02-05 DIAGNOSIS — Z434 Encounter for attention to other artificial openings of digestive tract: Secondary | ICD-10-CM | POA: Diagnosis not present

## 2022-02-05 DIAGNOSIS — R001 Bradycardia, unspecified: Secondary | ICD-10-CM | POA: Diagnosis not present

## 2022-02-05 DIAGNOSIS — K810 Acute cholecystitis: Secondary | ICD-10-CM | POA: Diagnosis not present

## 2022-02-05 DIAGNOSIS — Z96651 Presence of right artificial knee joint: Secondary | ICD-10-CM | POA: Diagnosis not present

## 2022-02-05 DIAGNOSIS — A4150 Gram-negative sepsis, unspecified: Secondary | ICD-10-CM | POA: Diagnosis not present

## 2022-02-05 DIAGNOSIS — M1990 Unspecified osteoarthritis, unspecified site: Secondary | ICD-10-CM | POA: Diagnosis not present

## 2022-02-05 DIAGNOSIS — G8929 Other chronic pain: Secondary | ICD-10-CM | POA: Diagnosis not present

## 2022-02-05 DIAGNOSIS — M545 Low back pain, unspecified: Secondary | ICD-10-CM | POA: Diagnosis not present

## 2022-02-05 DIAGNOSIS — Z9181 History of falling: Secondary | ICD-10-CM | POA: Diagnosis not present

## 2022-02-05 DIAGNOSIS — A4181 Sepsis due to Enterococcus: Secondary | ICD-10-CM | POA: Diagnosis not present

## 2022-02-05 DIAGNOSIS — C221 Intrahepatic bile duct carcinoma: Secondary | ICD-10-CM | POA: Diagnosis not present

## 2022-02-05 DIAGNOSIS — Z9689 Presence of other specified functional implants: Secondary | ICD-10-CM | POA: Diagnosis not present

## 2022-02-05 NOTE — Telephone Encounter (Signed)
Spoke with patient spouse to confirm upcoming appointments  

## 2022-02-13 DIAGNOSIS — K810 Acute cholecystitis: Secondary | ICD-10-CM | POA: Diagnosis not present

## 2022-02-13 DIAGNOSIS — R001 Bradycardia, unspecified: Secondary | ICD-10-CM | POA: Diagnosis not present

## 2022-02-13 DIAGNOSIS — A4181 Sepsis due to Enterococcus: Secondary | ICD-10-CM | POA: Diagnosis not present

## 2022-02-13 DIAGNOSIS — Z23 Encounter for immunization: Secondary | ICD-10-CM | POA: Diagnosis not present

## 2022-02-13 DIAGNOSIS — C221 Intrahepatic bile duct carcinoma: Secondary | ICD-10-CM | POA: Diagnosis not present

## 2022-02-13 DIAGNOSIS — A4150 Gram-negative sepsis, unspecified: Secondary | ICD-10-CM | POA: Diagnosis not present

## 2022-02-13 DIAGNOSIS — Z434 Encounter for attention to other artificial openings of digestive tract: Secondary | ICD-10-CM | POA: Diagnosis not present

## 2022-02-19 DIAGNOSIS — Z434 Encounter for attention to other artificial openings of digestive tract: Secondary | ICD-10-CM | POA: Diagnosis not present

## 2022-02-19 DIAGNOSIS — A4181 Sepsis due to Enterococcus: Secondary | ICD-10-CM | POA: Diagnosis not present

## 2022-02-19 DIAGNOSIS — K810 Acute cholecystitis: Secondary | ICD-10-CM | POA: Diagnosis not present

## 2022-02-19 DIAGNOSIS — C221 Intrahepatic bile duct carcinoma: Secondary | ICD-10-CM | POA: Diagnosis not present

## 2022-02-19 DIAGNOSIS — A4150 Gram-negative sepsis, unspecified: Secondary | ICD-10-CM | POA: Diagnosis not present

## 2022-02-19 DIAGNOSIS — R001 Bradycardia, unspecified: Secondary | ICD-10-CM | POA: Diagnosis not present

## 2022-02-26 ENCOUNTER — Ambulatory Visit
Admission: RE | Admit: 2022-02-26 | Discharge: 2022-02-26 | Disposition: A | Discharge status: Home / Self Care | Payer: Medicare Other | Source: Ambulatory Visit | Attending: Radiation Oncology | Admitting: Radiation Oncology

## 2022-02-26 NOTE — Progress Notes (Signed)
  Radiation Oncology         [phone] ________________________________  Name: Patrick Collier MRN: [id number]  Date of Service: 02/26/2022  DOB: [birth year]  Post Treatment Telephone Note  Diagnosis:  Probable cholangiocarcinoma, versus carcinoma of the head of pancreas  Intent: Curative  Radiation Treatment Dates: 12/04/2021 through 01/16/2022 Site Technique Total Dose (Gy) Dose per Fx (Gy) Completed Fx Beam Energies  Pancreas: Pancreas IMRT 45/45 1.8 25/25 6X  Pancreas: Pancreas_Bst IMRT 5.4/5.4 1.8 3/3 6X  (as documented in provider EOT note)   The patient was available for call today.   Symptoms of fatigue have improved since completing therapy.  Symptoms of skin changes have  improved since completing therapy but is still having some mild redness around his ABD drain port.  Symptoms of nausea or vomiting have  improved since completing therapy.   The patient has scheduled follow up with his medical oncologist Dr. Burr Medico for ongoing surveillance, and was encouraged to call if he  develops concerns or questions regarding radiation.  This concludes the nursing interview.  Leandra Kern, LPN

## 2022-02-27 ENCOUNTER — Other Ambulatory Visit (HOSPITAL_COMMUNITY): Payer: Medicare Other

## 2022-02-27 ENCOUNTER — Telehealth: Payer: Self-pay | Admitting: Internal Medicine

## 2022-02-27 NOTE — Progress Notes (Signed)
Patient ID: Patrick Collier, male   DOB: [birth year], 81 y.o.   MRN: [id number] RN Eustaquio Maize, from [facility] called stating pt has had some redness around cholecystostomy drain tube. Beth states that the picture that the patient wife sent yesterday had area of redness with crusting around site. She denies fevers. RN advised wife to clean around area. Pt wife called Alvis Lemmings RN again this morning and sent another picture. Beth, RN, states that in picture today, the redness has decreased. RN was advised to put neosporin around insertion site, re-dress and monitor for fever. If it remains stable with no worsening pain or redness, the tube can be evaluated at his upcoming appointment 03/05/22. She was also advised if there are any issues to please call and we will get him in for evaluation sooner.     Narda Rutherford, AGNP-BC 02/27/2022, 12:48 PM

## 2022-03-05 ENCOUNTER — Inpatient Hospital Stay: Payer: Medicare Other | Attending: Physician Assistant

## 2022-03-05 ENCOUNTER — Ambulatory Visit (HOSPITAL_COMMUNITY)
Admission: RE | Admit: 2022-03-05 | Discharge: 2022-03-05 | Disposition: A | Discharge status: Home / Self Care | Payer: Medicare Other | Source: Ambulatory Visit | Attending: Radiology | Admitting: Radiology

## 2022-03-05 DIAGNOSIS — Z434 Encounter for attention to other artificial openings of digestive tract: Secondary | ICD-10-CM | POA: Insufficient documentation

## 2022-03-05 DIAGNOSIS — K810 Acute cholecystitis: Secondary | ICD-10-CM | POA: Insufficient documentation

## 2022-03-05 DIAGNOSIS — T85520A Displacement of bile duct prosthesis, initial encounter: Secondary | ICD-10-CM | POA: Diagnosis not present

## 2022-03-05 DIAGNOSIS — C221 Intrahepatic bile duct carcinoma: Secondary | ICD-10-CM | POA: Insufficient documentation

## 2022-03-05 HISTORY — PX: IR EXCHANGE BILIARY DRAIN: IMG6046

## 2022-03-05 LAB — CBC WITH DIFFERENTIAL/PLATELET
Abs Immature Granulocytes: 0.01 10*3/uL (ref 0.00–0.07)
Basophils Absolute: 0 10*3/uL (ref 0.0–0.1)
Basophils Relative: 0 %
Eosinophils Absolute: 0.1 10*3/uL (ref 0.0–0.5)
Eosinophils Relative: 3 %
HCT: 38.9 % — ABNORMAL LOW (ref 39.0–52.0)
Hemoglobin: 12.8 g/dL — ABNORMAL LOW (ref 13.0–17.0)
Immature Granulocytes: 0 %
Lymphocytes Relative: 13 %
Lymphs Abs: 0.7 10*3/uL (ref 0.7–4.0)
MCH: 32.5 pg (ref 26.0–34.0)
MCHC: 32.9 g/dL (ref 30.0–36.0)
MCV: 98.7 fL (ref 80.0–100.0)
Monocytes Absolute: 0.7 10*3/uL (ref 0.1–1.0)
Monocytes Relative: 13 %
Neutro Abs: 3.6 10*3/uL (ref 1.7–7.7)
Neutrophils Relative %: 71 %
Platelets: 224 10*3/uL (ref 150–400)
RBC: 3.94 MIL/uL — ABNORMAL LOW (ref 4.22–5.81)
RDW: 15.5 % (ref 11.5–15.5)
WBC: 5.1 10*3/uL (ref 4.0–10.5)
nRBC: 0 % (ref 0.0–0.2)

## 2022-03-05 LAB — COMPREHENSIVE METABOLIC PANEL
ALT: 16 U/L (ref 0–44)
AST: 23 U/L (ref 15–41)
Albumin: 3.8 g/dL (ref 3.5–5.0)
Alkaline Phosphatase: 94 U/L (ref 38–126)
Anion gap: 3 — ABNORMAL LOW (ref 5–15)
BUN: 17 mg/dL (ref 8–23)
CO2: 32 mmol/L (ref 22–32)
Calcium: 9.3 mg/dL (ref 8.9–10.3)
Chloride: 103 mmol/L (ref 98–111)
Creatinine, Ser: 0.97 mg/dL (ref 0.61–1.24)
GFR, Estimated: >60 mL/min (ref >60)
Glucose, Bld: 105 mg/dL — ABNORMAL HIGH (ref 70–99)
Potassium: 4.5 mmol/L (ref 3.5–5.1)
Sodium: 138 mmol/L (ref 135–145)
Total Bilirubin: 0.5 mg/dL (ref 0.3–1.2)
Total Protein: 7.6 g/dL (ref 6.5–8.1)

## 2022-03-05 MED ORDER — LIDOCAINE HCL 1 % IJ SOLN
INTRAMUSCULAR | Status: AC
  Filled 2022-03-05: qty 20

## 2022-03-06 DIAGNOSIS — K831 Obstruction of bile duct: Secondary | ICD-10-CM | POA: Diagnosis not present

## 2022-03-06 LAB — CANCER ANTIGEN 19-9: CA 19-9: 22 U/mL (ref 0–35)

## 2022-03-26 DIAGNOSIS — D225 Melanocytic nevi of trunk: Secondary | ICD-10-CM | POA: Diagnosis not present

## 2022-03-26 DIAGNOSIS — L570 Actinic keratosis: Secondary | ICD-10-CM | POA: Diagnosis not present

## 2022-03-26 DIAGNOSIS — L814 Other melanin hyperpigmentation: Secondary | ICD-10-CM | POA: Diagnosis not present

## 2022-03-26 DIAGNOSIS — L853 Xerosis cutis: Secondary | ICD-10-CM | POA: Diagnosis not present

## 2022-03-26 DIAGNOSIS — L821 Other seborrheic keratosis: Secondary | ICD-10-CM | POA: Diagnosis not present

## 2022-05-06 NOTE — Assessment & Plan Note (Addendum)
cTxN0M0 -presented with elevated LFT's, jaundice, 10-15 lb weight loss. CT AP 10/06/21 showed biliary obstruction, he was referred to ED. -baseline CA 19-9 on 10/06/21 elevated to 129. -s/p ERCP 10/11/21 inpatient showed a mass in common bile duct, cytology not diagnostic showing only suspicious atypical cells. S/p stent placement  -staging chest CT 11/03/21 was negative. -he met Dr. Zenia Resides on 11/08/21 and was offered surgery, but pt declined. -repeat ERCP on 11/21/21 for stent change, path and cytology were again non-diagnostic but suspicious. -he received radiation 12/04/21 - 01/16/22. He tried concurrent Xeloda for a week but tolerated poorly with nausea, weight loss, and rash to his hands and face, discontinued 8/22.  -He developed cholecystitis during treatment which was managed with percutaneous cholecystostomy  -He is clinically doing well, will continue monitoring.

## 2022-05-07 ENCOUNTER — Encounter: Payer: Self-pay | Admitting: Hematology

## 2022-05-07 ENCOUNTER — Inpatient Hospital Stay: Payer: Medicare Other

## 2022-05-07 ENCOUNTER — Inpatient Hospital Stay: Payer: Medicare Other | Attending: Physician Assistant | Admitting: Hematology

## 2022-05-07 VITALS — BP 137/78 | HR 51 | Temp 98.0°F | Resp 18 | Ht 66.0 in | Wt 170.3 lb

## 2022-05-07 DIAGNOSIS — C221 Intrahepatic bile duct carcinoma: Secondary | ICD-10-CM | POA: Diagnosis not present

## 2022-05-07 LAB — COMPREHENSIVE METABOLIC PANEL
ALT: 16 U/L (ref 0–44)
AST: 24 U/L (ref 15–41)
Albumin: 3.5 g/dL (ref 3.5–5.0)
Alkaline Phosphatase: 105 U/L (ref 38–126)
Anion gap: 3 — ABNORMAL LOW (ref 5–15)
BUN: 14 mg/dL (ref 8–23)
CO2: 32 mmol/L (ref 22–32)
Calcium: 9.4 mg/dL (ref 8.9–10.3)
Chloride: 105 mmol/L (ref 98–111)
Creatinine, Ser: 0.92 mg/dL (ref 0.61–1.24)
GFR, Estimated: >60 mL/min (ref >60)
Glucose, Bld: 90 mg/dL (ref 70–99)
Potassium: 4.5 mmol/L (ref 3.5–5.1)
Sodium: 140 mmol/L (ref 135–145)
Total Bilirubin: 0.6 mg/dL (ref 0.3–1.2)
Total Protein: 7.2 g/dL (ref 6.5–8.1)

## 2022-05-07 LAB — CBC WITH DIFFERENTIAL/PLATELET
Abs Immature Granulocytes: 0.01 10*3/uL (ref 0.00–0.07)
Basophils Absolute: 0 10*3/uL (ref 0.0–0.1)
Basophils Relative: 1 %
Eosinophils Absolute: 0.3 10*3/uL (ref 0.0–0.5)
Eosinophils Relative: 6 %
HCT: 38 % — ABNORMAL LOW (ref 39.0–52.0)
Hemoglobin: 13 g/dL (ref 13.0–17.0)
Immature Granulocytes: 0 %
Lymphocytes Relative: 14 %
Lymphs Abs: 0.6 10*3/uL — ABNORMAL LOW (ref 0.7–4.0)
MCH: 33.8 pg (ref 26.0–34.0)
MCHC: 34.2 g/dL (ref 30.0–36.0)
MCV: 98.7 fL (ref 80.0–100.0)
Monocytes Absolute: 0.7 10*3/uL (ref 0.1–1.0)
Monocytes Relative: 18 %
Neutro Abs: 2.5 10*3/uL (ref 1.7–7.7)
Neutrophils Relative %: 61 %
Platelets: 227 10*3/uL (ref 150–400)
RBC: 3.85 MIL/uL — ABNORMAL LOW (ref 4.22–5.81)
RDW: 12.7 % (ref 11.5–15.5)
WBC: 4 10*3/uL (ref 4.0–10.5)
nRBC: 0 % (ref 0.0–0.2)

## 2022-05-07 NOTE — Progress Notes (Signed)
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 CT AP 10/06/21 showed biliary obstruction, he was referred to ED. -baseline CA 19-9 on 10/06/21 elevated to 129. -s/p ERCP 10/11/21 inpatient showed a mass in common bile duct, cytology not diagnostic showing only suspicious atypical cells. S/p stent placement  -staging chest CT 11/03/21 was negative. -he met Dr. Zenia Resides on 11/08/21 and was offered surgery, but pt declined. -repeat ERCP on 11/21/21 for stent change, path and cytology were again non-diagnostic but suspicious. -he received radiation 12/04/21 - 01/16/22. He tried concurrent Xeloda for a week but tolerated poorly with nausea, weight loss, and rash to his hands and face, discontinued 8/22.  -He developed cholecystitis during treatment which was managed with percutaneous cholecystostomy  -He is clinically doing well, will continue monitoring.    PLAN: -lab reviewed. - I recommend pt to eat a high protein. -order CT scan to be done before next visit  -f/u in 2 months.   SUMMARY OF ONCOLOGIC HISTORY: Oncology History  Cholangiocarcinoma ([place])  10/07/2021 Imaging   MR ABDOMEN MRCP W WO CONTAST  IMPRESSION: 1. Severe biliary ductal dilation of both the intra and extrahepatic bile ducts with abrupt cutoff seen at the distal common bile duct. Findings are concerning for stricture or obstructing tumor. No obstructing mass is seen, although motion artifact  on contrast-enhanced imaging limits evaluation. Recommend correlation with ERCP. 2. Small cystic lesions of the pancreatic tail, largest measures 10 mm. Recommend follow up pre and post contrast MRI/MRCP or pancreatic protocol CT in 2 years. This recommendation follows ACR consensus guidelines: Management of Incidental Pancreatic Cysts: A White Paper of the ACR Incidental Findings Committee. J Am Coll Radiol 0175;10:258-527. 3. Trace perihepatic ascites and trace bilateral pleural effusions.   10/11/2021 Procedure   ERCP-Dr. Fuller Plan  Impression:  -I single localized malignant appearing severe biliary stricture was found in the lower third of the main bile duct. -The proximal biliary tree was dilated, secondary to stricture -Biliary sphincterotomy was performed -Cells for cytology obtained in the lower third of the main duct -One plastic stent was placed into the common bile duct.     10/11/2021 Pathology Results   CYTOLOGY - NON PAP  CASE: [id number]  PATIENT: [name]  Non-Gynecological Cytology Report   FINAL MICROSCOPIC DIAGNOSIS:  A. COMMON BILE DUCT, LESION, FINE NEEDLE ASPIRATION:  - No malignant cells seen  -Scant cellularity; benign/reactive ductal cells and ductal epithelium  with acute inflammatory cells   B. COMMON BILE DUCT, STRICTURE, BRUSHING:  - Atypical cells suspicious for tumor present    10/26/2021 Initial Diagnosis   Cholangiocarcinoma ([place])    Procedure   Upper EUS-Dr. Paulita Fujita  Impression:  - There was no sign of significant pathology in the ampulla. - A mass was found in the common bile duct. Fine needle aspiration performed. - There was no sign of significant pathology in the pancreatic head, genu of the pancreas and pancreatic body. - A few abnormal lymph nodes were visualized in the  peripancreatic region and porta hepatis region. - There was dilation in the middle third of the main bile duct, in the upper third of the main bile duct and  in the common hepatic duct which measured up to 15 mm. - Small perihepatic ascites. - Overall constellation findings most consistent mid common bile duct cholangiocarcinoma.   11/21/2021 Cancer Staging   Staging form: Distal Bile Duct, AJCC 8th Edition - Clinical stage from 11/21/2021: Stage Unknown (cTX, cN0, cM0) - Signed by Truitt Merle, MD on 12/12/2021 Total positive nodes: 0      INTERVAL HISTORY:  Patrick Collier is here for a follow up of   suspected cholangiocarcinoma He was last seen by me on 01/01/2022 He presents to the clinic accompanied wife. Pt reports he recover well from radiation. Wife had a question about the stent that was placed. Pt wife had a question about his wife. Pt does yard work for activity.     All other systems were reviewed with the patient and are negative.  MEDICAL HISTORY:  Past Medical History:  Diagnosis Date   Arthritis    Back pain    Heart murmur    History of kidney stones    ICH (intracerebral hemorrhage) ([place])     SURGICAL HISTORY: Past Surgical History:  Procedure Laterality Date   BACK SURGERY     BILIARY BRUSHING  10/11/2021   Procedure: BILIARY BRUSHING;  Surgeon: Ladene Artist, MD;  Location: [facility];  Service: Gastroenterology;;   BILIARY BRUSHING  11/21/2021   Procedure: BILIARY BRUSHING;  Surgeon: Clarene Essex, MD;  Location: [facility];  Service: Gastroenterology;;   BILIARY STENT PLACEMENT N/A 10/11/2021   Procedure: BILIARY STENT PLACEMENT;  Surgeon: Ladene Artist, MD;  Location: [facility];  Service: Gastroenterology;  Laterality: N/A;   BILIARY STENT PLACEMENT N/A 11/21/2021   Procedure: BILIARY STENT PLACEMENT;  Surgeon: Clarene Essex, MD;  Location: [facility];  Service: Gastroenterology;  Laterality: N/A;   BIOPSY  11/21/2021   Procedure: BIOPSY;  Surgeon: Clarene Essex, MD;  [facility]: [facility];  Service: Gastroenterology;;   ERCP N/A 10/11/2021   Procedure: ENDOSCOPIC RETROGRADE CHOLANGIOPANCREATOGRAPHY (ERCP);   Surgeon: Ladene Artist, MD;  Location: [facility];  Service: Gastroenterology;  Laterality: N/A;   ERCP N/A 11/21/2021   Procedure: ENDOSCOPIC RETROGRADE CHOLANGIOPANCREATOGRAPHY (ERCP);  Surgeon: Clarene Essex, MD;  Location: [facility];  Service: Gastroenterology;  Laterality: N/A;  with Spyglass   ESOPHAGOGASTRODUODENOSCOPY (EGD) WITH PROPOFOL N/A 10/11/2021   Procedure: ESOPHAGOGASTRODUODENOSCOPY (EGD) WITH PROPOFOL;  Surgeon: Arta Silence, MD;  [facility]: [facility];  Service: Gastroenterology;  Laterality: N/A;   EUS N/A 10/11/2021   Procedure: UPPER ENDOSCOPIC ULTRASOUND (EUS) LINEAR;  Surgeon: Arta Silence, MD;  Location: [facility];  Service: Gastroenterology;  Laterality: N/A;   FINE NEEDLE ASPIRATION N/A 10/11/2021   Procedure: FINE NEEDLE ASPIRATION (FNA) LINEAR;  Surgeon: Arta Silence, MD;  [facility]: [facility];  Service: Gastroenterology;  Laterality: N/A;   INGUINAL HERNIA REPAIR     IR EXCHANGE BILIARY DRAIN  01/22/2022   IR EXCHANGE BILIARY DRAIN  01/26/2022   IR EXCHANGE BILIARY DRAIN  03/05/2022   IR PERC CHOLECYSTOSTOMY  01/02/2022   KNEE ARTHROPLASTY Right 05/16/2017   Procedure: RIGHT TOTAL KNEE ARTHROPLASTY WITH COMPUTER NAVIGATION;  Surgeon: Rod Can, MD;  Location: [facility];  Service: Orthopedics;  Laterality: Right;  Needs RNFA   NECK SURGERY     SPHINCTEROTOMY  10/11/2021   Procedure: SPHINCTEROTOMY;  Surgeon: Ladene Artist, MD;  Location: [facility];  Service: Gastroenterology;;   [name]  11/21/2021   Procedure: [name];  Surgeon: Clarene Essex, MD;  Location: [facility];  Service: Gastroenterology;;   [name] CHOLANGIOSCOPY N/A 11/21/2021   Procedure: OVZCHYIF CHOLANGIOSCOPY;  Surgeon: Clarene Essex, MD;  [facility]: [facility];  Service: Gastroenterology;  Laterality: N/A;   STENT REMOVAL  11/21/2021   Procedure: STENT REMOVAL;  Surgeon: Clarene Essex, MD;  Location: [facility];  Service: Gastroenterology;;   TONSILLECTOMY      I  have reviewed the social history and family history with the patient and they are unchanged from previous note.  ALLERGIES:  has No Known Allergies.  MEDICATIONS:  Current Outpatient Medications  Medication Sig Dispense Refill   acetaminophen (TYLENOL) 500 MG tablet Take 1,000 mg by mouth daily as needed (pain).     No current facility-administered medications for this visit.    PHYSICAL EXAMINATION: ECOG PERFORMANCE STATUS: 0 - Asymptomatic  Vitals:   05/07/22 1222  BP: 137/78  Pulse: (!) 51  Resp: 18  Temp: 98 F (36.7 C)  SpO2: 98%   Wt Readings from Last 3 Encounters:  05/07/22 170 lb 4.8 oz (77.2 kg)  02/02/22 160 lb 14.4 oz (73 kg)  01/01/22 162 lb 11.2 oz (73.8 kg)    GENERAL:alert, no distress and comfortable SKIN: skin color, texture, turgor are normal, no rashes or significant lesions EYES: normal, Conjunctiva are pink and non-injected, sclera clear NECK: (-) supple, thyroid normal size, non-tender, without nodularity LYMPH: (-) no palpable lymphadenopathy in the cervical, axillary  LUNGS: (-) clear to auscultation and percussion with normal breathing effort HEART: (-) regular rate & rhythm and no murmurs and no lower extremity edema ABDOMEN:(-) abdomen soft, non-tender and normal bowel sounds Liver not enlarge Musculoskeletal:no cyanosis of digits and no clubbing  NEURO: alert & oriented x 3 with fluent speech, no focal motor/sensory deficits  LABORATORY DATA:  I have reviewed the data as listed    Latest Ref Rng & Units 05/07/2022   11:21 AM 03/05/2022    1:42 PM 02/02/2022   12:54 PM  CBC  WBC 4.0 - 10.5 K/uL 4.0  5.1  6.3   Hemoglobin 13.0 - 17.0 g/dL 13.0  12.8  12.2   Hematocrit 39.0 - 52.0 % 38.0  38.9  36.6   Platelets 150 - 400 K/uL 227  224  172         Latest Ref Rng & Units 05/07/2022   11:21 AM 03/05/2022    1:42 PM 02/02/2022   12:54 PM  CMP  Glucose 70 - 99 mg/dL 90  105  110   BUN 8 - 23 mg/dL '14  17  9   '$ Creatinine 0.61 - 1.24  mg/dL 0.92  0.97  0.83   Sodium 135 - 145 mmol/L 140  138  138   Potassium 3.5 - 5.1 mmol/L 4.5  4.5  4.2   Chloride 98 - 111 mmol/L 105  103  102   CO2 22 - 32 mmol/L 32  32  33   Calcium 8.9 - 10.3 mg/dL 9.4  9.3  8.9   Total Protein 6.5 - 8.1 g/dL 7.2  7.6  6.9   Total Bilirubin 0.3 - 1.2 mg/dL 0.6  0.5  0.5   Alkaline Phos 38 - 126 U/L 105  94  95   AST 15 - 41 U/L '24  23  22   '$ ALT 0 - 44 U/L 16  16  15  RADIOGRAPHIC STUDIES: I have personally reviewed the radiological images as listed and agreed with the findings in the report. No results found.    Orders Placed This Encounter  Procedures   CT ABDOMEN PELVIS W CONTRAST    Standing Status:   Future    Standing Expiration Date:   05/08/2023    Order Specific Question:   If indicated for the ordered procedure, I authorize the administration of contrast media per Radiology protocol    Answer:   Yes    Order Specific Question:   Preferred imaging location?    Answer:   [facility]    Order Specific Question:   Is Oral Contrast requested for this exam?    Answer:   Yes, Per Radiology protocol   All questions were answered. The patient knows to call the clinic with any problems, questions or concerns. No barriers to learning was detected. The total time spent in the appointment was 20 minutes.     Truitt Merle, MD 05/07/2022   Felicity Coyer, CMA, am acting as scribe for Truitt Merle, MD.   I have reviewed the above documentation for accuracy and completeness, and I agree with the above.

## 2022-05-08 LAB — CANCER ANTIGEN 19-9: CA 19-9: 22 U/mL (ref 0–35)

## 2022-05-14 ENCOUNTER — Ambulatory Visit (HOSPITAL_COMMUNITY): Payer: Medicare Other | Attending: General Practice

## 2022-05-14 DIAGNOSIS — I350 Nonrheumatic aortic (valve) stenosis: Secondary | ICD-10-CM | POA: Diagnosis not present

## 2022-05-14 DIAGNOSIS — I77810 Thoracic aortic ectasia: Secondary | ICD-10-CM | POA: Insufficient documentation

## 2022-05-14 DIAGNOSIS — R011 Cardiac murmur, unspecified: Secondary | ICD-10-CM | POA: Insufficient documentation

## 2022-05-14 LAB — ECHOCARDIOGRAM COMPLETE
AR max vel: 0.97 cm2
AV Area VTI: 1.06 cm2
AV Area mean vel: 1.01 cm2
AV Mean grad: 29 mmHg
AV Peak grad: 54.2 mmHg
Ao pk vel: 3.68 m/s
Area-P 1/2: 1.85 cm2
S' Lateral: 2.1 cm

## 2022-05-29 DIAGNOSIS — R413 Other amnesia: Secondary | ICD-10-CM | POA: Diagnosis not present

## 2022-05-29 DIAGNOSIS — Z0000 Encounter for general adult medical examination without abnormal findings: Secondary | ICD-10-CM | POA: Diagnosis not present

## 2022-05-29 DIAGNOSIS — C221 Intrahepatic bile duct carcinoma: Secondary | ICD-10-CM | POA: Diagnosis not present

## 2022-05-29 DIAGNOSIS — E785 Hyperlipidemia, unspecified: Secondary | ICD-10-CM | POA: Diagnosis not present

## 2022-05-29 DIAGNOSIS — R011 Cardiac murmur, unspecified: Secondary | ICD-10-CM | POA: Diagnosis not present

## 2022-05-29 DIAGNOSIS — R7309 Other abnormal glucose: Secondary | ICD-10-CM | POA: Diagnosis not present

## 2022-05-29 DIAGNOSIS — D7589 Other specified diseases of blood and blood-forming organs: Secondary | ICD-10-CM | POA: Diagnosis not present

## 2022-06-11 NOTE — Progress Notes (Signed)
HPI: Follow-up aortic stenosis.  Abdominal ultrasound January 2019 showed no aneurysm.  Last echocardiogram January 2024 showed normal LV function, grade 1 diastolic dysfunction, mild right ventricular enlargement, moderate aortic stenosis with mean gradient 29 mmHg.  Patient also presently undergoing therapy for cholangiocarcinoma.  Since last seen he denies dyspnea, chest pain, palpitations or syncope.  He is undergoing therapy for his cholangiocarcinoma.  Current Outpatient Medications  Medication Sig Dispense Refill   acetaminophen (TYLENOL) 500 MG tablet Take 1,000 mg by mouth daily as needed (pain).     No current facility-administered medications for this visit.     Past Medical History:  Diagnosis Date   Arthritis    Back pain    Heart murmur    History of kidney stones    ICH (intracerebral hemorrhage) ([place])     Past Surgical History:  Procedure Laterality Date   BACK SURGERY     BILIARY BRUSHING  10/11/2021   Procedure: BILIARY BRUSHING;  Surgeon: Ladene Artist, MD;  Location: [facility];  Service: Gastroenterology;;   BILIARY BRUSHING  11/21/2021   Procedure: BILIARY BRUSHING;  Surgeon: Clarene Essex, MD;  Location: [facility];  Service: Gastroenterology;;   BILIARY STENT PLACEMENT N/A 10/11/2021   Procedure: BILIARY STENT PLACEMENT;  Surgeon: Ladene Artist, MD;  Location: [facility];  Service: Gastroenterology;  Laterality: N/A;   BILIARY STENT PLACEMENT N/A 11/21/2021   Procedure: BILIARY STENT PLACEMENT;  Surgeon: Clarene Essex, MD;  [facility]: [facility];  Service: Gastroenterology;  Laterality: N/A;   BIOPSY  11/21/2021   Procedure: BIOPSY;  Surgeon: Clarene Essex, MD;  Location: [facility];  Service: Gastroenterology;;   ERCP N/A 10/11/2021   Procedure: ENDOSCOPIC RETROGRADE CHOLANGIOPANCREATOGRAPHY (ERCP);  Surgeon: Ladene Artist, MD;  Location: [facility];  Service: Gastroenterology;  Laterality: N/A;   ERCP N/A 11/21/2021   Procedure: ENDOSCOPIC  RETROGRADE CHOLANGIOPANCREATOGRAPHY (ERCP);  Surgeon: Clarene Essex, MD;  Location: [facility];  Service: Gastroenterology;  Laterality: N/A;  with Spyglass   ESOPHAGOGASTRODUODENOSCOPY (EGD) WITH PROPOFOL N/A 10/11/2021   Procedure: ESOPHAGOGASTRODUODENOSCOPY (EGD) WITH PROPOFOL;  Surgeon: Arta Silence, MD;  Location: [facility];  Service: Gastroenterology;  Laterality: N/A;   EUS N/A 10/11/2021   Procedure: UPPER ENDOSCOPIC ULTRASOUND (EUS) LINEAR;  Surgeon: Arta Silence, MD;  Location: [facility];  Service: Gastroenterology;  Laterality: N/A;   FINE NEEDLE ASPIRATION N/A 10/11/2021   Procedure: FINE NEEDLE ASPIRATION (FNA) LINEAR;  Surgeon: Arta Silence, MD;  Location: [facility];  Service: Gastroenterology;  Laterality: N/A;   INGUINAL HERNIA REPAIR     IR EXCHANGE BILIARY DRAIN  01/22/2022   IR EXCHANGE BILIARY DRAIN  01/26/2022   IR EXCHANGE BILIARY DRAIN  03/05/2022   IR PERC CHOLECYSTOSTOMY  01/02/2022   KNEE ARTHROPLASTY Right 05/16/2017   Procedure: RIGHT TOTAL KNEE ARTHROPLASTY WITH COMPUTER NAVIGATION;  Surgeon: Rod Can, MD;  Location: [facility];  Service: Orthopedics;  Laterality: Right;  Needs RNFA   NECK SURGERY     SPHINCTEROTOMY  10/11/2021   Procedure: SPHINCTEROTOMY;  Surgeon: Ladene Artist, MD;  Location: [facility];  Service: Gastroenterology;;   [name]  11/21/2021   Procedure: [name];  Surgeon: Clarene Essex, MD;  Location: [facility];  Service: Gastroenterology;;   [name] CHOLANGIOSCOPY N/A 11/21/2021   Procedure: [id number] CHOLANGIOSCOPY;  Surgeon: Clarene Essex, MD;  Location: [facility];  Service: Gastroenterology;  Laterality: N/A;   STENT REMOVAL  11/21/2021   Procedure: STENT REMOVAL;  Surgeon: Clarene Essex, MD;  Location: [facility];  Service: Gastroenterology;;  TONSILLECTOMY      Social History   Socioeconomic History   Marital status: Married    Spouse name: Not on file   Number of children: 3   Years of education: Not on  file   Highest education level: Not on file  Occupational History    Comment: Retired  Tobacco Use   Smoking status: Former   Smokeless tobacco: Never  Scientific laboratory technician Use: Never used  Substance and Sexual Activity   Alcohol use: Yes    Comment: one beer per day   Drug use: No   Sexual activity: Not on file  Other Topics Concern   Not on file  Social History Narrative   Not on file   Social Determinants of Health   Financial Resource Strain: Not on file  Food Insecurity: Not on file  Transportation Needs: Not on file  Physical Activity: Not on file  Stress: Not on file  Social Connections: Not on file  Intimate Partner Violence: Not on file    Family History  Problem Relation Age of Onset   Diabetes Mother     ROS: no fevers or chills, productive cough, hemoptysis, dysphasia, odynophagia, melena, hematochezia, dysuria, hematuria, rash, seizure activity, orthopnea, PND, pedal edema, claudication. Remaining systems are negative.  Physical Exam: Well-developed well-nourished in no acute distress.  Skin is warm and dry.  HEENT is normal.  Neck is supple.  Chest is clear to auscultation with normal expansion.  Cardiovascular exam is regular rate and rhythm.  2/6 systolic murmur left sternal border.  S2 is not diminished. Abdominal exam nontender or distended. No masses palpated. Extremities show no edema. neuro grossly intact  ECG-sinus bradycardia at a rate of 55, nonspecific ST changes.  Personally reviewed  A/P  1 aortic stenosis-moderate on most recent echocardiogram.  Will plan follow-up study in January 2025 or sooner if he develops symptoms.  He is aware of the symptoms to watch for including dyspnea, chest pain and syncope.  She will be complicated by his diagnosis of cholangiocarcinoma and prognosis.  2 ongoing therapy for cholangiocarcinoma-Per oncology.  Kirk Ruths, MD

## 2022-06-25 ENCOUNTER — Encounter: Payer: Self-pay | Admitting: Cardiology

## 2022-06-25 ENCOUNTER — Ambulatory Visit: Payer: Medicare Other | Admitting: Neurology

## 2022-06-25 ENCOUNTER — Ambulatory Visit: Payer: Medicare Other | Attending: Cardiology | Admitting: Cardiology

## 2022-06-25 VITALS — BP 122/70 | HR 55 | Ht 66.0 in | Wt 175.2 lb

## 2022-06-25 DIAGNOSIS — C221 Intrahepatic bile duct carcinoma: Secondary | ICD-10-CM | POA: Insufficient documentation

## 2022-06-25 DIAGNOSIS — I350 Nonrheumatic aortic (valve) stenosis: Secondary | ICD-10-CM | POA: Insufficient documentation

## 2022-06-25 NOTE — Patient Instructions (Signed)
    Follow-Up: At [facility], you and your health needs are our priority.  As part of our continuing mission to provide you with exceptional heart care, we have created designated Provider Care Teams.  These Care Teams include your primary Cardiologist (physician) and Advanced Practice Providers (APPs -  Physician Assistants and Nurse Practitioners) who all work together to provide you with the care you need, when you need it.  We recommend signing up for the patient portal called "MyChart".  Sign up information is provided on this After Visit Summary.  MyChart is used to connect with patients for Virtual Visits (Telemedicine).  Patients are able to view lab/test results, encounter notes, upcoming appointments, etc.  Non-urgent messages can be sent to your provider as well.   To learn more about what you can do with MyChart, go to [email or web address].    Your next appointment:   6 month(s)  Provider:   Kirk Ruths, MD

## 2022-07-02 ENCOUNTER — Inpatient Hospital Stay: Payer: Medicare Other | Attending: Physician Assistant

## 2022-07-02 ENCOUNTER — Ambulatory Visit (HOSPITAL_COMMUNITY)
Admission: RE | Admit: 2022-07-02 | Discharge: 2022-07-02 | Disposition: A | Discharge status: Home / Self Care | Payer: Medicare Other | Source: Ambulatory Visit | Attending: Hematology | Admitting: Hematology

## 2022-07-02 DIAGNOSIS — C221 Intrahepatic bile duct carcinoma: Secondary | ICD-10-CM | POA: Diagnosis not present

## 2022-07-02 DIAGNOSIS — K5730 Diverticulosis of large intestine without perforation or abscess without bleeding: Secondary | ICD-10-CM | POA: Diagnosis not present

## 2022-07-02 DIAGNOSIS — K828 Other specified diseases of gallbladder: Secondary | ICD-10-CM | POA: Diagnosis not present

## 2022-07-02 LAB — COMPREHENSIVE METABOLIC PANEL
ALT: 21 U/L (ref 0–44)
AST: 29 U/L (ref 15–41)
Albumin: 3.7 g/dL (ref 3.5–5.0)
Alkaline Phosphatase: 102 U/L (ref 38–126)
Anion gap: 3 — ABNORMAL LOW (ref 5–15)
BUN: 16 mg/dL (ref 8–23)
CO2: 30 mmol/L (ref 22–32)
Calcium: 9.2 mg/dL (ref 8.9–10.3)
Chloride: 106 mmol/L (ref 98–111)
Creatinine, Ser: 0.99 mg/dL (ref 0.61–1.24)
GFR, Estimated: >60 mL/min (ref >60)
Glucose, Bld: 71 mg/dL (ref 70–99)
Potassium: 4.4 mmol/L (ref 3.5–5.1)
Sodium: 139 mmol/L (ref 135–145)
Total Bilirubin: 0.7 mg/dL (ref 0.3–1.2)
Total Protein: 7.3 g/dL (ref 6.5–8.1)

## 2022-07-02 LAB — CBC WITH DIFFERENTIAL/PLATELET
Abs Immature Granulocytes: 0.01 10*3/uL (ref 0.00–0.07)
Basophils Absolute: 0 10*3/uL (ref 0.0–0.1)
Basophils Relative: 0 %
Eosinophils Absolute: 0.2 10*3/uL (ref 0.0–0.5)
Eosinophils Relative: 3 %
HCT: 38.2 % — ABNORMAL LOW (ref 39.0–52.0)
Hemoglobin: 12.8 g/dL — ABNORMAL LOW (ref 13.0–17.0)
Immature Granulocytes: 0 %
Lymphocytes Relative: 11 %
Lymphs Abs: 0.5 10*3/uL — ABNORMAL LOW (ref 0.7–4.0)
MCH: 33.1 pg (ref 26.0–34.0)
MCHC: 33.5 g/dL (ref 30.0–36.0)
MCV: 98.7 fL (ref 80.0–100.0)
Monocytes Absolute: 0.8 10*3/uL (ref 0.1–1.0)
Monocytes Relative: 16 %
Neutro Abs: 3.3 10*3/uL (ref 1.7–7.7)
Neutrophils Relative %: 70 %
Platelets: 199 10*3/uL (ref 150–400)
RBC: 3.87 MIL/uL — ABNORMAL LOW (ref 4.22–5.81)
RDW: 13.3 % (ref 11.5–15.5)
WBC: 4.8 10*3/uL (ref 4.0–10.5)
nRBC: 0 % (ref 0.0–0.2)

## 2022-07-02 MED ORDER — IOHEXOL 300 MG/ML  SOLN
100.0000 mL | Freq: Once | INTRAMUSCULAR | Status: AC | PRN
  Administered 2022-07-02: 100 mL via INTRAVENOUS

## 2022-07-02 MED ORDER — SODIUM CHLORIDE (PF) 0.9 % IJ SOLN
INTRAMUSCULAR | Status: AC
  Filled 2022-07-02: qty 50

## 2022-07-04 LAB — CANCER ANTIGEN 19-9: CA 19-9: 23 U/mL (ref 0–35)

## 2022-07-06 ENCOUNTER — Inpatient Hospital Stay (HOSPITAL_BASED_OUTPATIENT_CLINIC_OR_DEPARTMENT_OTHER): Payer: Medicare Other | Admitting: Hematology

## 2022-07-06 ENCOUNTER — Encounter: Payer: Self-pay | Admitting: Hematology

## 2022-07-06 VITALS — BP 130/76 | HR 56 | Temp 98.2°F | Resp 18 | Ht 66.0 in | Wt 174.5 lb

## 2022-07-06 DIAGNOSIS — C221 Intrahepatic bile duct carcinoma: Secondary | ICD-10-CM

## 2022-07-06 NOTE — Assessment & Plan Note (Addendum)
cTxN0M0 -presented with elevated LFT's, jaundice, 10-15 lb weight loss. CT AP 10/06/21 showed biliary obstruction, he was referred to ED. -baseline CA 19-9 on 10/06/21 elevated to 129. -s/p ERCP 10/11/21 inpatient showed a mass in common bile duct, cytology not diagnostic showing only suspicious atypical cells. S/p stent placement  -staging chest CT 11/03/21 was negative. -he met Dr. Zenia Resides on 11/08/21 and was offered surgery, but pt declined. -repeat ERCP on 11/21/21 for stent change, path and cytology were again non-diagnostic but suspicious. -he received radiation 12/04/21 - 01/16/22. He tried concurrent Xeloda for a week but tolerated poorly with nausea, weight loss, and rash to his hands and face, discontinued 8/22.  -He developed cholecystitis during treatment which was managed with percutaneous cholecystostomy  -restaging CT 07/02/2022 showed Decompression of the gallbladder with resolution of adjacent fluid collection since previous imaging, no evidence of cancer progression  -He is clinically doing well, will continue monitoring.

## 2022-07-06 NOTE — Progress Notes (Signed)
[place]   [phone] Fax:([phone]) [phone]   Clinic Follow up Note   Patient Care Team: London Pepper, MD as PCP - General (Family Medicine) Stanford Breed Denice Bors, MD as PCP - Cardiology (Cardiology) Lelon Perla, MD as Consulting Physician (Cardiology)  Date of Service:  07/06/2022  CHIEF COMPLAINT: f/u of  cholangiocarcinoma   CURRENT THERAPY:  Surveillance  ASSESSMENT:  Patrick Collier is a 82 y.o. 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 CT AP 10/06/21 showed biliary obstruction, he was referred to ED. -baseline CA 19-9 on 10/06/21 elevated to 129. -s/p ERCP 10/11/21 inpatient showed a mass in common bile duct, cytology not diagnostic showing only suspicious atypical cells. S/p stent placement  -staging chest CT 11/03/21 was negative. -he met Dr. Zenia Resides on 11/08/21 and was offered surgery, but pt declined. -repeat ERCP on 11/21/21 for stent change, path and cytology were again non-diagnostic but suspicious. -he received radiation 12/04/21 - 01/16/22. He tried concurrent Xeloda for a week but tolerated poorly with nausea, weight loss, and rash to his hands and face, discontinued 8/22.  -He developed cholecystitis during treatment which was managed with percutaneous cholecystostomy  -restaging CT 07/02/2022 showed Decompression of the gallbladder with resolution of adjacent fluid collection since previous imaging, no evidence of cancer progression.  I explained to patient and his wife that his primary tumor in bile duct is hard to see on CT scan, so we are not able to tell if he has any residual disease (likely he still has) based on CT, but he is clinically doing well, I think he had a good response to radiation.  -He has had a metal stent placed in bile duct, no need routine stent exchange.  -He is clinically doing well, will continue monitoring.     PLAN: -discuss CT scan findings, no  signs of cancer progression -Tumor Marker -normal -Don't recommend any treatment, just surveillance -Repeat CT scan in 6 months -lab,f/u in 3 months   SUMMARY OF ONCOLOGIC HISTORY: Oncology History Overview Note   Cancer Staging  Cholangiocarcinoma [facility]) Staging form: Distal Bile Duct, AJCC 8th Edition - Clinical stage from 11/21/2021: Stage Unknown (cTX, cN0, cM0) - Signed by Truitt Merle, MD on 12/12/2021 Total positive nodes: 0     Cholangiocarcinoma ([place])  10/07/2021 Imaging   MR ABDOMEN MRCP W WO CONTAST  IMPRESSION: 1. Severe biliary ductal dilation of both the intra and extrahepatic bile ducts with abrupt cutoff seen at the distal common bile duct. Findings are concerning for stricture or obstructing tumor. No obstructing mass is seen, although motion artifact on contrast-enhanced imaging limits evaluation. Recommend correlation with ERCP. 2. Small cystic lesions of the pancreatic tail, largest measures 10 mm. Recommend follow up pre and post contrast MRI/MRCP or pancreatic protocol CT in 2 years. This recommendation follows ACR consensus guidelines: Management of Incidental Pancreatic Cysts: A White Paper of the ACR Incidental Findings Committee. J Am Coll Radiol [id number]. 3. Trace perihepatic ascites and trace bilateral pleural effusions.   10/11/2021 Procedure   ERCP-Dr. Fuller Plan  Impression:  -I single localized malignant appearing severe biliary stricture was found in the lower third of the main bile duct. -The proximal biliary tree was dilated, secondary to stricture -Biliary sphincterotomy was performed -Cells for cytology obtained in the lower third of the main duct -One plastic stent was placed into the common bile duct.     10/11/2021 Pathology Results   CYTOLOGY - NON  PAP  CASE: [id number]  PATIENT: Patrick Collier  Non-Gynecological Cytology Report   FINAL MICROSCOPIC DIAGNOSIS:  A. COMMON BILE DUCT, LESION, FINE NEEDLE ASPIRATION:  - No malignant  cells seen  -Scant cellularity; benign/reactive ductal cells and ductal epithelium  with acute inflammatory cells   B. COMMON BILE DUCT, STRICTURE, BRUSHING:  - Atypical cells suspicious for tumor present    10/26/2021 Initial Diagnosis   Cholangiocarcinoma ([place])    Procedure   Upper EUS-Dr. Paulita Fujita  Impression:  - There was no sign of significant pathology in the ampulla. - A mass was found in the common bile duct. Fine needle aspiration performed. - There was no sign of significant pathology in the pancreatic head, genu of the pancreas and pancreatic body. - A few abnormal lymph nodes were visualized in the peripancreatic region and porta hepatis region. - There was dilation in the middle third of the main bile duct, in the upper third of the main bile duct and in the common hepatic duct which measured up to 15 mm. - Small perihepatic ascites. - Overall constellation findings most consistent mid common bile duct cholangiocarcinoma.   11/21/2021 Cancer Staging   Staging form: Distal Bile Duct, AJCC 8th Edition - Clinical stage from 11/21/2021: Stage Unknown (cTX, cN0, cM0) - Signed by Truitt Merle, MD on 12/12/2021 Total positive nodes: 0   07/02/2022 Imaging    IMPRESSION: 1. Decompression of the gallbladder with resolution of adjacent fluid collection since previous imaging. 2. Mild stranding about the porta hepatis and stranding/soft tissue density surrounding replaced RIGHT hepatic artery which arises from the SMA. 3. Under distension versus thickening of the hepatic flexure of the colon adjacent to the hepatic duodenal ligament just inferior to the gallbladder. No substantial surrounding stranding in this area. Correlate with any symptoms of colitis, this is favored to represent decompression versus mild secondary changes adjacent of the colon in the setting of previous inflammatory change. Could consider short interval follow-up or Cologuard correlation. Could also  consider direct visualization as warranted for further assessment as a more aggressive approach. 4. Stable cystic lesion in the body-tail of the pancreas. This may represent a small pseudocyst or indolent cystic neoplasm. Attention on follow-up. 5. Aortic atherosclerosis. 6. Fat containing LEFT inguinal and umbilical hernias are small. 7. Signs of aortic valve and mitral valve calcification. 8. Colonic diverticulosis.      INTERVAL HISTORY:  Patrick Collier is here for a follow up of cholangiocarcinoma  He was last seen by me on 05/07/2022 He presents to the clinic accompanied by wife. Pt state that he has recovered from radiation. Pt denied having any BM issues. Pt report that he had a stomach ache.    All other systems were reviewed with the patient and are negative.  MEDICAL HISTORY:  Past Medical History:  Diagnosis Date   Arthritis    Back pain    Heart murmur    History of kidney stones    ICH (intracerebral hemorrhage) ([place])     SURGICAL HISTORY: Past Surgical History:  Procedure Laterality Date   BACK SURGERY     BILIARY BRUSHING  10/11/2021   Procedure: BILIARY BRUSHING;  Surgeon: Ladene Artist, MD;  Location: [facility];  Service: Gastroenterology;;   BILIARY BRUSHING  11/21/2021   Procedure: BILIARY BRUSHING;  Surgeon: Clarene Essex, MD;  Location: [facility];  Service: Gastroenterology;;   BILIARY STENT PLACEMENT N/A 10/11/2021   Procedure: BILIARY STENT PLACEMENT;  Surgeon: Ladene Artist, MD;  Location: [facility];  Service: Gastroenterology;  Laterality: N/A;   BILIARY STENT PLACEMENT N/A 11/21/2021   Procedure: BILIARY STENT PLACEMENT;  Surgeon: Clarene Essex, MD;  Location: [facility];  Service: Gastroenterology;  Laterality: N/A;   BIOPSY  11/21/2021   Procedure: BIOPSY;  Surgeon: Clarene Essex, MD;  [facility]: [facility];  Service: Gastroenterology;;   ERCP N/A 10/11/2021   Procedure: ENDOSCOPIC RETROGRADE CHOLANGIOPANCREATOGRAPHY (ERCP);  Surgeon:  Ladene Artist, MD;  Location: [facility];  Service: Gastroenterology;  Laterality: N/A;   ERCP N/A 11/21/2021   Procedure: ENDOSCOPIC RETROGRADE CHOLANGIOPANCREATOGRAPHY (ERCP);  Surgeon: Clarene Essex, MD;  Location: [facility];  Service: Gastroenterology;  Laterality: N/A;  with Spyglass   ESOPHAGOGASTRODUODENOSCOPY (EGD) WITH PROPOFOL N/A 10/11/2021   Procedure: ESOPHAGOGASTRODUODENOSCOPY (EGD) WITH PROPOFOL;  Surgeon: Arta Silence, MD;  [facility]: [facility];  Service: Gastroenterology;  Laterality: N/A;   EUS N/A 10/11/2021   Procedure: UPPER ENDOSCOPIC ULTRASOUND (EUS) LINEAR;  Surgeon: Arta Silence, MD;  Location: [facility];  Service: Gastroenterology;  Laterality: N/A;   FINE NEEDLE ASPIRATION N/A 10/11/2021   Procedure: FINE NEEDLE ASPIRATION (FNA) LINEAR;  Surgeon: Arta Silence, MD;  Location: [facility];  Service: Gastroenterology;  Laterality: N/A;   INGUINAL HERNIA REPAIR     IR EXCHANGE BILIARY DRAIN  01/22/2022   IR EXCHANGE BILIARY DRAIN  01/26/2022   IR EXCHANGE BILIARY DRAIN  03/05/2022   IR PERC CHOLECYSTOSTOMY  01/02/2022   KNEE ARTHROPLASTY Right 05/16/2017   Procedure: RIGHT TOTAL KNEE ARTHROPLASTY WITH COMPUTER NAVIGATION;  Surgeon: Rod Can, MD;  Location: [facility];  Service: Orthopedics;  Laterality: Right;  Needs RNFA   NECK SURGERY     SPHINCTEROTOMY  10/11/2021   Procedure: SPHINCTEROTOMY;  Surgeon: Ladene Artist, MD;  Location: [facility];  Service: Gastroenterology;;   [name]  11/21/2021   Procedure: [name];  Surgeon: Clarene Essex, MD;  Location: [facility];  Service: Gastroenterology;;   [name] CHOLANGIOSCOPY N/A 11/21/2021   Procedure: [id number] CHOLANGIOSCOPY;  Surgeon: Clarene Essex, MD;  [facility]: [facility];  Service: Gastroenterology;  Laterality: N/A;   STENT REMOVAL  11/21/2021   Procedure: STENT REMOVAL;  Surgeon: Clarene Essex, MD;  Location: [facility];  Service: Gastroenterology;;   TONSILLECTOMY      I have  reviewed the social history and family history with the patient and they are unchanged from previous note.  ALLERGIES:  has No Known Allergies.  MEDICATIONS:  Current Outpatient Medications  Medication Sig Dispense Refill   acetaminophen (TYLENOL) 500 MG tablet Take 1,000 mg by mouth daily as needed (pain).     No current facility-administered medications for this visit.    PHYSICAL EXAMINATION: ECOG PERFORMANCE STATUS: 0 - Asymptomatic  Vitals:   07/06/22 1042  BP: 130/76  Pulse: (!) 56  Resp: 18  Temp: 98.2 F (36.8 C)  SpO2: 95%   Wt Readings from Last 3 Encounters:  07/06/22 174 lb 8 oz (79.2 kg)  06/25/22 175 lb 3.2 oz (79.5 kg)  05/07/22 170 lb 4.8 oz (77.2 kg)     ABDOMEN:(-) abdomen soft,(-)  non-tender and normal bowel sounds   LABORATORY DATA:  I have reviewed the data as listed    Latest Ref Rng & Units 07/02/2022   10:53 AM 05/07/2022   11:21 AM 03/05/2022    1:42 PM  CBC  WBC 4.0 - 10.5 K/uL 4.8  4.0  5.1   Hemoglobin 13.0 - 17.0 g/dL 12.8  13.0  12.8   Hematocrit 39.0 - 52.0 % 38.2  38.0  38.9  Platelets 150 - 400 K/uL 199  227  224         Latest Ref Rng & Units 07/02/2022   10:53 AM 05/07/2022   11:21 AM 03/05/2022    1:42 PM  CMP  Glucose 70 - 99 mg/dL 71  90  105   BUN 8 - 23 mg/dL 16  14  17    Creatinine 0.61 - 1.24 mg/dL 0.99  0.92  0.97   Sodium 135 - 145 mmol/L 139  140  138   Potassium 3.5 - 5.1 mmol/L 4.4  4.5  4.5   Chloride 98 - 111 mmol/L 106  105  103   CO2 22 - 32 mmol/L 30  32  32   Calcium 8.9 - 10.3 mg/dL 9.2  9.4  9.3   Total Protein 6.5 - 8.1 g/dL 7.3  7.2  7.6   Total Bilirubin 0.3 - 1.2 mg/dL 0.7  0.6  0.5   Alkaline Phos 38 - 126 U/L 102  105  94   AST 15 - 41 U/L 29  24  23    ALT 0 - 44 U/L 21  16  16        RADIOGRAPHIC STUDIES: I have personally reviewed the radiological images as listed and agreed with the findings in the report. No results found.    No orders of the defined types were placed in this  encounter.  All questions were answered. The patient knows to call the clinic with any problems, questions or concerns. No barriers to learning was detected. The total time spent in the appointment was 30 minutes.     Truitt Merle, MD 07/06/2022   Felicity Coyer, CMA, am acting as scribe for Truitt Merle, MD.   I have reviewed the above documentation for accuracy and completeness, and I agree with the above.

## 2022-07-18 ENCOUNTER — Encounter: Payer: Self-pay | Admitting: Diagnostic Neuroimaging

## 2022-07-18 ENCOUNTER — Ambulatory Visit (INDEPENDENT_AMBULATORY_CARE_PROVIDER_SITE_OTHER): Payer: Medicare Other | Admitting: Diagnostic Neuroimaging

## 2022-07-18 ENCOUNTER — Telehealth: Payer: Self-pay | Admitting: Diagnostic Neuroimaging

## 2022-07-18 VITALS — BP 112/70 | HR 59 | Ht 66.0 in | Wt 174.8 lb

## 2022-07-18 DIAGNOSIS — R413 Other amnesia: Secondary | ICD-10-CM | POA: Diagnosis not present

## 2022-07-18 NOTE — Patient Instructions (Signed)
  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

## 2022-07-18 NOTE — Progress Notes (Signed)
[facility]  PATIENT: Patrick Collier DOB: [birth year]  REFERRING CLINICIAN: London Pepper, MD HISTORY FROM: patient  REASON FOR VISIT: new consult   HISTORICAL  CHIEF COMPLAINT:  Chief Complaint  Patient presents with   New Patient (Initial Visit)    Patient in room #7 with his wife. Patient states he here today to discuss his memory loss.    HISTORY OF PRESENT ILLNESS:   82 year old male here for valuation of memory loss.    Patient had a fall from roof in 2017 with some "bleeding on the brain" that was monitored conservatively.  Patient had personality behavior changes that has been persistent since that time.  Previously he was very laid back in pleasant.  Since that time he has been somewhat more irritable and agitated.  January 2023 patient had more progression of short-term memory problems, perseverative behaviors, difficulty using the TV remote, difficulty keeping up with the events and conversations.  June 2023 patient developed severe jaundice was diagnosed with bile duct cancer and treated.  He had additional memory and confusion problems around this time.    REVIEW OF SYSTEMS: Full 14 system review of systems performed and negative with exception of: as per HPI.  ALLERGIES: No Known Allergies  HOME MEDICATIONS: Outpatient Medications Prior to Visit  Medication Sig Dispense Refill   acetaminophen (TYLENOL) 500 MG tablet Take 1,000 mg by mouth daily as needed (pain).     lactulose (CEPHULAC) 20 g packet Take 20 g by mouth 3 (three) times daily. (Patient not taking: Reported on 07/18/2022)     No facility-administered medications prior to visit.    PAST MEDICAL HISTORY: Past Medical History:  Diagnosis Date   Arthritis    Back pain    Heart murmur    History of kidney stones    ICH (intracerebral hemorrhage) ([place])     PAST SURGICAL HISTORY: Past Surgical History:  Procedure Laterality Date   BACK SURGERY     BILIARY BRUSHING   10/11/2021   Procedure: BILIARY BRUSHING;  Surgeon: Ladene Artist, MD;  Location: [facility];  Service: Gastroenterology;;   BILIARY BRUSHING  11/21/2021   Procedure: BILIARY BRUSHING;  Surgeon: Clarene Essex, MD;  Location: [facility];  Service: Gastroenterology;;   BILIARY STENT PLACEMENT N/A 10/11/2021   Procedure: BILIARY STENT PLACEMENT;  Surgeon: Ladene Artist, MD;  Location: [facility];  Service: Gastroenterology;  Laterality: N/A;   BILIARY STENT PLACEMENT N/A 11/21/2021   Procedure: BILIARY STENT PLACEMENT;  Surgeon: Clarene Essex, MD;  [facility]: [facility];  Service: Gastroenterology;  Laterality: N/A;   BIOPSY  11/21/2021   Procedure: BIOPSY;  Surgeon: Clarene Essex, MD;  Location: [facility];  Service: Gastroenterology;;   ERCP N/A 10/11/2021   Procedure: ENDOSCOPIC RETROGRADE CHOLANGIOPANCREATOGRAPHY (ERCP);  Surgeon: Ladene Artist, MD;  Location: [facility];  Service: Gastroenterology;  Laterality: N/A;   ERCP N/A 11/21/2021   Procedure: ENDOSCOPIC RETROGRADE CHOLANGIOPANCREATOGRAPHY (ERCP);  Surgeon: Clarene Essex, MD;  Location: [facility];  Service: Gastroenterology;  Laterality: N/A;  with Spyglass   ESOPHAGOGASTRODUODENOSCOPY (EGD) WITH PROPOFOL N/A 10/11/2021   Procedure: ESOPHAGOGASTRODUODENOSCOPY (EGD) WITH PROPOFOL;  Surgeon: Arta Silence, MD;  [facility]: [facility];  Service: Gastroenterology;  Laterality: N/A;   EUS N/A 10/11/2021   Procedure: UPPER ENDOSCOPIC ULTRASOUND (EUS) LINEAR;  Surgeon: Arta Silence, MD;  Location: [facility];  Service: Gastroenterology;  Laterality: N/A;   FINE NEEDLE ASPIRATION N/A 10/11/2021   Procedure: FINE NEEDLE ASPIRATION (FNA) LINEAR;  Surgeon: Arta Silence, MD;  Location: [facility];  Service: Gastroenterology;  Laterality: N/A;   INGUINAL HERNIA REPAIR     IR EXCHANGE BILIARY DRAIN  01/22/2022   IR EXCHANGE BILIARY DRAIN  01/26/2022   IR EXCHANGE BILIARY DRAIN  03/05/2022   IR PERC CHOLECYSTOSTOMY  01/02/2022   KNEE  ARTHROPLASTY Right 05/16/2017   Procedure: RIGHT TOTAL KNEE ARTHROPLASTY WITH COMPUTER NAVIGATION;  Surgeon: Rod Can, MD;  Location: [facility];  Service: Orthopedics;  Laterality: Right;  Needs RNFA   NECK SURGERY     SPHINCTEROTOMY  10/11/2021   Procedure: SPHINCTEROTOMY;  Surgeon: Ladene Artist, MD;  Location: [facility];  Service: Gastroenterology;;   [name]  11/21/2021   Procedure: [name];  Surgeon: Clarene Essex, MD;  Location: [facility];  Service: Gastroenterology;;   [name] CHOLANGIOSCOPY N/A 11/21/2021   Procedure: [id number] CHOLANGIOSCOPY;  Surgeon: Clarene Essex, MD;  [facility]: [facility];  Service: Gastroenterology;  Laterality: N/A;   STENT REMOVAL  11/21/2021   Procedure: STENT REMOVAL;  Surgeon: Clarene Essex, MD;  [facility]: [facility];  Service: Gastroenterology;;   TONSILLECTOMY      FAMILY HISTORY: Family History  Problem Relation Age of Onset   Diabetes Mother     SOCIAL HISTORY: Social History   Socioeconomic History   Marital status: Married    Spouse name: Not on file   Number of children: 3   Years of education: Not on file   Highest education level: Not on file  Occupational History    Comment: Retired  Tobacco Use   Smoking status: Former   Smokeless tobacco: Never  Scientific laboratory technician Use: Never used  Substance and Sexual Activity   Alcohol use: Yes    Comment: one beer per day   Drug use: No   Sexual activity: Not on file  Other Topics Concern   Not on file  Social History Narrative   Not on file   Social Determinants of Health   Financial Resource Strain: Not on file  Food Insecurity: Not on file  Transportation Needs: Not on file  Physical Activity: Not on file  Stress: Not on file  Social Connections: Not on file  Intimate Partner Violence: Not on file     PHYSICAL EXAM  GENERAL EXAM/CONSTITUTIONAL: Vitals:  Vitals:   07/18/22 1047  BP: 112/70  Pulse: (!) 59  Weight: 174 lb 12.8 oz (79.3 kg)  Height:  5\' 6"  (1.676 m)   Body mass index is 28.21 kg/m. Wt Readings from Last 3 Encounters:  07/18/22 174 lb 12.8 oz (79.3 kg)  07/06/22 174 lb 8 oz (79.2 kg)  06/25/22 175 lb 3.2 oz (79.5 kg)   Patient is in no distress; well developed, nourished and groomed; neck is supple  CARDIOVASCULAR: Examination of carotid arteries is normal; no carotid bruits Regular rate and rhythm, SYSTOLIC MURMUR Examination of peripheral vascular system by observation and palpation is normal  EYES: Ophthalmoscopic exam of optic discs and posterior segments is normal; no papilledema or hemorrhages No results found.  MUSCULOSKELETAL: Gait, strength, tone, movements noted in Neurologic exam below  NEUROLOGIC: MENTAL STATUS:     07/18/2022   10:53 AM  MMSE - Mini Mental State Exam  Orientation to time 3  Orientation to Place 5  Registration 3  Attention/ Calculation 2  Recall 1  Language- name 2 objects 2  Language- repeat 1  Language- follow 3 step command 3  Language- read & follow direction 1  Write a sentence 1  Copy design 1  Total score 23   awake, alert, oriented to person, place and time recent and remote memory intact normal attention and concentration language fluent, comprehension intact, naming intact fund of knowledge appropriate  CRANIAL NERVE:  2nd - no papilledema on fundoscopic exam 2nd, 3rd, 4th, 6th - pupils equal and reactive to light, visual fields full to confrontation, extraocular muscles intact, no nystagmus 5th - facial sensation symmetric 7th - facial strength symmetric 8th - hearing intact 9th - palate elevates symmetrically, uvula midline 11th - shoulder shrug symmetric 12th - tongue protrusion midline  MOTOR:  normal bulk and tone, full strength in the BUE, BLE  SENSORY:  normal and symmetric to light touch, temperature, vibration  COORDINATION:  finger-nose-finger, fine finger movements normal  REFLEXES:  deep tendon reflexes trace and  symmetric  GAIT/STATION:  narrow based gait     DIAGNOSTIC DATA (LABS, IMAGING, TESTING) - I reviewed patient records, labs, notes, testing and imaging myself where available.  Lab Results  Component Value Date   WBC 4.8 07/02/2022   HGB 12.8 (L) 07/02/2022   HCT 38.2 (L) 07/02/2022   MCV 98.7 07/02/2022   PLT 199 07/02/2022      Component Value Date/Time   NA 139 07/02/2022 1053   K 4.4 07/02/2022 1053   CL 106 07/02/2022 1053   CO2 30 07/02/2022 1053   GLUCOSE 71 07/02/2022 1053   BUN 16 07/02/2022 1053   CREATININE 0.99 07/02/2022 1053   CREATININE 0.91 11/27/2021 1313   CALCIUM 9.2 07/02/2022 1053   PROT 7.3 07/02/2022 1053   ALBUMIN 3.7 07/02/2022 1053   AST 29 07/02/2022 1053   AST 21 11/27/2021 1313   ALT 21 07/02/2022 1053   ALT 19 11/27/2021 1313   ALKPHOS 102 07/02/2022 1053   BILITOT 0.7 07/02/2022 1053   BILITOT 1.3 (H) 11/27/2021 1313   GFRNONAA >60 07/02/2022 1053   GFRNONAA >60 11/27/2021 1313   GFRAA >60 05/17/2017 0526   No results found for: "CHOL", "HDL", "LDLCALC", "LDLDIRECT", "TRIG", "CHOLHDL" Lab Results  Component Value Date   HGBA1C 5.4 10/11/2021   No results found for: "VITAMINB12" No results found for: "TSH"   01/01/22 CT head 1. No CT evidence for acute intracranial abnormality. 2. Atrophy and mild chronic small vessel ischemic changes of the white matter   ASSESSMENT AND PLAN  82 y.o. year old male here with:   Dx:  1. 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; since ~2023; MMSE 23/30; decline in ADLs) - check MRI brain, B12, TSH, ATN profile - consider memantine 10mg  at bedtime; increase to twice a day after 1-2 weeks - safety / supervision issues reviewed - daily physical activity / exercise (at least 15-30 minutes) - eat more plants / vegetables - increase social activities, brain stimulation, games, puzzles, hobbies, crafts, arts, music - aim for at least 7-8 hours sleep per night  (or more) - avoid smoking and alcohol - caregiver resources provided - caution with medications, finances; no driving  Orders Placed This Encounter  Procedures   MR BRAIN W WO CONTRAST   Vitamin B12   TSH   ATN PROFILE   Return for pending if symptoms worsen or fail to improve, pending test results.    Penni Bombard, MD [id number], [id number] AM Certified in Neurology, Neurophysiology and Neuroimaging  [facility] [address] [place],  [address] [phone]

## 2022-07-18 NOTE — Telephone Encounter (Signed)
medicare/BCBS sup NPR sent to GI [phone] 

## 2022-07-21 LAB — TSH: TSH: 16.9 u[IU]/mL — ABNORMAL HIGH (ref 0.450–4.500)

## 2022-07-21 LAB — ATN PROFILE
A -- Beta-amyloid 42/40 Ratio: 0.098 — ABNORMAL LOW (ref >0.102)
Beta-amyloid 40: 204.22 pg/mL
Beta-amyloid 42: 20.1 pg/mL
N -- NfL, Plasma: 4.41 pg/mL (ref 0.00–11.55)
T -- p-tau181: 1.14 pg/mL — ABNORMAL HIGH (ref 0.00–0.97)

## 2022-07-21 LAB — VITAMIN B12: Vitamin B-12: 327 pg/mL (ref 232–1245)

## 2022-07-23 DIAGNOSIS — C221 Intrahepatic bile duct carcinoma: Secondary | ICD-10-CM | POA: Diagnosis not present

## 2022-07-23 DIAGNOSIS — E039 Hypothyroidism, unspecified: Secondary | ICD-10-CM | POA: Diagnosis not present

## 2022-07-23 DIAGNOSIS — R413 Other amnesia: Secondary | ICD-10-CM | POA: Diagnosis not present

## 2022-08-02 DIAGNOSIS — H532 Diplopia: Secondary | ICD-10-CM | POA: Diagnosis not present

## 2022-08-02 DIAGNOSIS — H2513 Age-related nuclear cataract, bilateral: Secondary | ICD-10-CM | POA: Diagnosis not present

## 2022-08-02 DIAGNOSIS — H5213 Myopia, bilateral: Secondary | ICD-10-CM | POA: Diagnosis not present

## 2022-08-08 ENCOUNTER — Other Ambulatory Visit (HOSPITAL_COMMUNITY): Payer: Self-pay

## 2022-08-09 ENCOUNTER — Ambulatory Visit
Admission: RE | Admit: 2022-08-09 | Discharge: 2022-08-09 | Disposition: A | Discharge status: Home / Self Care | Payer: Medicare Other | Source: Ambulatory Visit | Attending: Diagnostic Neuroimaging | Admitting: Diagnostic Neuroimaging

## 2022-08-09 DIAGNOSIS — R413 Other amnesia: Secondary | ICD-10-CM

## 2022-08-09 MED ORDER — GADOPICLENOL 0.5 MMOL/ML IV SOLN
8.0000 mL | Freq: Once | INTRAVENOUS | Status: AC | PRN
  Administered 2022-08-09: 8 mL via INTRAVENOUS

## 2022-09-11 DIAGNOSIS — E039 Hypothyroidism, unspecified: Secondary | ICD-10-CM | POA: Diagnosis not present

## 2022-10-05 ENCOUNTER — Inpatient Hospital Stay: Payer: Medicare Other | Attending: Physician Assistant

## 2022-10-05 ENCOUNTER — Other Ambulatory Visit: Payer: Self-pay

## 2022-10-05 ENCOUNTER — Inpatient Hospital Stay (HOSPITAL_BASED_OUTPATIENT_CLINIC_OR_DEPARTMENT_OTHER): Payer: Medicare Other | Admitting: Hematology

## 2022-10-05 ENCOUNTER — Encounter: Payer: Self-pay | Admitting: Hematology

## 2022-10-05 VITALS — BP 124/74 | HR 54 | Temp 98.4°F | Resp 17 | Wt 175.9 lb

## 2022-10-05 DIAGNOSIS — C221 Intrahepatic bile duct carcinoma: Secondary | ICD-10-CM | POA: Insufficient documentation

## 2022-10-05 DIAGNOSIS — E039 Hypothyroidism, unspecified: Secondary | ICD-10-CM | POA: Insufficient documentation

## 2022-10-05 LAB — COMPREHENSIVE METABOLIC PANEL
ALT: 19 U/L (ref 0–44)
AST: 29 U/L (ref 15–41)
Albumin: 3.6 g/dL (ref 3.5–5.0)
Alkaline Phosphatase: 111 U/L (ref 38–126)
Anion gap: 4 — ABNORMAL LOW (ref 5–15)
BUN: 11 mg/dL (ref 8–23)
CO2: 30 mmol/L (ref 22–32)
Calcium: 9.4 mg/dL (ref 8.9–10.3)
Chloride: 106 mmol/L (ref 98–111)
Creatinine, Ser: 1.03 mg/dL (ref 0.61–1.24)
GFR, Estimated: >60 mL/min (ref >60)
Glucose, Bld: 112 mg/dL — ABNORMAL HIGH (ref 70–99)
Potassium: 4.2 mmol/L (ref 3.5–5.1)
Sodium: 140 mmol/L (ref 135–145)
Total Bilirubin: 0.8 mg/dL (ref 0.3–1.2)
Total Protein: 6.8 g/dL (ref 6.5–8.1)

## 2022-10-05 LAB — CBC WITH DIFFERENTIAL/PLATELET
Abs Immature Granulocytes: 0.01 10*3/uL (ref 0.00–0.07)
Basophils Absolute: 0 10*3/uL (ref 0.0–0.1)
Basophils Relative: 1 %
Eosinophils Absolute: 0.1 10*3/uL (ref 0.0–0.5)
Eosinophils Relative: 3 %
HCT: 37.6 % — ABNORMAL LOW (ref 39.0–52.0)
Hemoglobin: 12.8 g/dL — ABNORMAL LOW (ref 13.0–17.0)
Immature Granulocytes: 0 %
Lymphocytes Relative: 15 %
Lymphs Abs: 0.6 10*3/uL — ABNORMAL LOW (ref 0.7–4.0)
MCH: 33.6 pg (ref 26.0–34.0)
MCHC: 34 g/dL (ref 30.0–36.0)
MCV: 98.7 fL (ref 80.0–100.0)
Monocytes Absolute: 0.7 10*3/uL (ref 0.1–1.0)
Monocytes Relative: 19 %
Neutro Abs: 2.4 10*3/uL (ref 1.7–7.7)
Neutrophils Relative %: 62 %
Platelets: 194 10*3/uL (ref 150–400)
RBC: 3.81 MIL/uL — ABNORMAL LOW (ref 4.22–5.81)
RDW: 13.2 % (ref 11.5–15.5)
WBC: 3.9 10*3/uL — ABNORMAL LOW (ref 4.0–10.5)
nRBC: 0 % (ref 0.0–0.2)

## 2022-10-05 NOTE — Progress Notes (Signed)
[facility]   Telephone:(336) [phone] Fax:(336) [phone]   Clinic Follow up Note   Patient Care Team: Farris Has, MD as PCP - General (Family Medicine) Jens Som Madolyn Frieze, MD as PCP - Cardiology (Cardiology) Lewayne Bunting, MD as Consulting Physician (Cardiology)  Date of Service:  10/05/2022  CHIEF COMPLAINT: f/u of cholangiocarcinoma   CURRENT THERAPY:  Surveillance  ASSESSMENT:  Patrick Collier is a 82 y.o. male with    Cholangiocarcinoma (HCC) cTxN0M0 -presented with elevated LFT's, jaundice, 10-15 lb weight loss. CT AP 10/06/21 showed biliary obstruction, he was referred to ED. -baseline CA 19-9 on 10/06/21 elevated to 129. -s/p ERCP 10/11/21 inpatient showed a mass in common bile duct, cytology not diagnostic showing only suspicious atypical cells. S/p stent placement  -staging chest CT 11/03/21 was negative. -he met Dr. Freida Busman on 11/08/21 and was offered surgery, but pt declined. -repeat ERCP on 11/21/21 for stent change, path and cytology were again non-diagnostic but suspicious. -he received radiation 12/04/21 - 01/16/22. He tried concurrent Xeloda for a week but tolerated poorly with nausea, weight loss, and rash to his hands and face, discontinued 8/22.  -He developed cholecystitis during treatment which was managed with percutaneous cholecystostomy  -restaging CT 07/02/2022 showed Decompression of the gallbladder with resolution of adjacent fluid collection since previous imaging, no evidence of cancer progression.  I explained to patient and his wife that his primary tumor in bile duct is hard to see on CT scan, so we are not able to tell if he has any residual disease (likely he still has) based on CT, but he is clinically doing well, I think he had a good response to radiation.  -He has had a metal stent placed in bile duct, no need routine stent exchange.  -He is clinically doing well, will continue monitoring. -f/u in 3 months with repeated lab and CT     Memory loss and hypothyroidism -Follow-up with neurology, he is on Synthroid now, dose adjusted by PCP.  PLAN: -lab reviewed -Copy Dr.Aaron Morrow,PCP -I order CT Abdomen/pelvis in 3 months -lab and  CT scan and f/u in 3 months   SUMMARY OF ONCOLOGIC HISTORY: Oncology History Overview Note   Cancer Staging  Cholangiocarcinoma [facility]) Staging form: Distal Bile Duct, AJCC 8th Edition - Clinical stage from 11/21/2021: Stage Unknown (cTX, cN0, cM0) - Signed by Malachy Mood, MD on 12/12/2021 Total positive nodes: 0     Cholangiocarcinoma (HCC)  10/07/2021 Imaging   MR ABDOMEN MRCP W WO CONTAST  IMPRESSION: 1. Severe biliary ductal dilation of both the intra and extrahepatic bile ducts with abrupt cutoff seen at the distal common bile duct. Findings are concerning for stricture or obstructing tumor. No obstructing mass is seen, although motion artifact on contrast-enhanced imaging limits evaluation. Recommend correlation with ERCP. 2. Small cystic lesions of the pancreatic tail, largest measures 10 mm. Recommend follow up pre and post contrast MRI/MRCP or pancreatic protocol CT in 2 years. This recommendation follows ACR consensus guidelines: Management of Incidental Pancreatic Cysts: A White Paper of the ACR Incidental Findings Committee. J Am Coll Radiol 2017;14:911-923. 3. Trace perihepatic ascites and trace bilateral pleural effusions.   10/11/2021 Procedure   ERCP-Dr. Russella Dar  Impression:  -I single localized malignant appearing severe biliary stricture was found in the lower third of the main bile duct. -The proximal biliary tree was dilated, secondary to stricture -Biliary sphincterotomy was performed -Cells for cytology obtained in the lower third of the main duct -One plastic stent  was placed into the common bile duct.     10/11/2021 Pathology Results   CYTOLOGY - NON PAP  CASE: [id number]  PATIENT: Caren Macadam  Non-Gynecological Cytology Report   FINAL  MICROSCOPIC DIAGNOSIS:  A. COMMON BILE DUCT, LESION, FINE NEEDLE ASPIRATION:  - No malignant cells seen  -Scant cellularity; benign/reactive ductal cells and ductal epithelium  with acute inflammatory cells   B. COMMON BILE DUCT, STRICTURE, BRUSHING:  - Atypical cells suspicious for tumor present    10/26/2021 Initial Diagnosis   Cholangiocarcinoma (HCC)    Procedure   Upper EUS-Dr. Dulce Sellar  Impression:  - There was no sign of significant pathology in the ampulla. - A mass was found in the common bile duct. Fine needle aspiration performed. - There was no sign of significant pathology in the pancreatic head, genu of the pancreas and pancreatic body. - A few abnormal lymph nodes were visualized in the peripancreatic region and porta hepatis region. - There was dilation in the middle third of the main bile duct, in the upper third of the main bile duct and in the common hepatic duct which measured up to 15 mm. - Small perihepatic ascites. - Overall constellation findings most consistent mid common bile duct cholangiocarcinoma.   11/21/2021 Cancer Staging   Staging form: Distal Bile Duct, AJCC 8th Edition - Clinical stage from 11/21/2021: Stage Unknown (cTX, cN0, cM0) - Signed by Malachy Mood, MD on 12/12/2021 Total positive nodes: 0   07/02/2022 Imaging    IMPRESSION: 1. Decompression of the gallbladder with resolution of adjacent fluid collection since previous imaging. 2. Mild stranding about the porta hepatis and stranding/soft tissue density surrounding replaced RIGHT hepatic artery which arises from the SMA. 3. Under distension versus thickening of the hepatic flexure of the colon adjacent to the hepatic duodenal ligament just inferior to the gallbladder. No substantial surrounding stranding in this area. Correlate with any symptoms of colitis, this is favored to represent decompression versus mild secondary changes adjacent of the colon in the setting of previous inflammatory  change. Could consider short interval follow-up or Cologuard correlation. Could also consider direct visualization as warranted for further assessment as a more aggressive approach. 4. Stable cystic lesion in the body-tail of the pancreas. This may represent a small pseudocyst or indolent cystic neoplasm. Attention on follow-up. 5. Aortic atherosclerosis. 6. Fat containing LEFT inguinal and umbilical hernias are small. 7. Signs of aortic valve and mitral valve calcification. 8. Colonic diverticulosis.      INTERVAL HISTORY:  Patrick Collier is here for a follow up of cholangiocarcinoma. He was last seen by me on 07/06/2022. He presents to the clinic accompanied by wife. Pt state that he is having Thyroid issues. Pt PCP has increase the dosage on his medication for thyroid. Pt denies having any issues with his abdomen and BM.    All other systems were reviewed with the patient and are negative.  MEDICAL HISTORY:  Past Medical History:  Diagnosis Date   Arthritis    Back pain    Heart murmur    History of kidney stones    ICH (intracerebral hemorrhage) (HCC)     SURGICAL HISTORY: Past Surgical History:  Procedure Laterality Date   BACK SURGERY     BILIARY BRUSHING  10/11/2021   Procedure: BILIARY BRUSHING;  Surgeon: Meryl Dare, MD;  Location: [facility];  Service: Gastroenterology;;   BILIARY BRUSHING  11/21/2021   Procedure: BILIARY BRUSHING;  Surgeon: Vida Rigger, MD;  Location: [facility];  Service: Gastroenterology;;   BILIARY STENT PLACEMENT N/A 10/11/2021   Procedure: BILIARY STENT PLACEMENT;  Surgeon: Meryl Dare, MD;  Location: [facility];  Service: Gastroenterology;  Laterality: N/A;   BILIARY STENT PLACEMENT N/A 11/21/2021   Procedure: BILIARY STENT PLACEMENT;  Surgeon: Vida Rigger, MD;  Location: [facility];  Service: Gastroenterology;  Laterality: N/A;   BIOPSY  11/21/2021   Procedure: BIOPSY;  Surgeon: Vida Rigger, MD;  Location: [facility];   Service: Gastroenterology;;   ERCP N/A 10/11/2021   Procedure: ENDOSCOPIC RETROGRADE CHOLANGIOPANCREATOGRAPHY (ERCP);  Surgeon: Meryl Dare, MD;  Location: [facility];  Service: Gastroenterology;  Laterality: N/A;   ERCP N/A 11/21/2021   Procedure: ENDOSCOPIC RETROGRADE CHOLANGIOPANCREATOGRAPHY (ERCP);  Surgeon: Vida Rigger, MD;  Location: [facility];  Service: Gastroenterology;  Laterality: N/A;  with Spyglass   ESOPHAGOGASTRODUODENOSCOPY (EGD) WITH PROPOFOL N/A 10/11/2021   Procedure: ESOPHAGOGASTRODUODENOSCOPY (EGD) WITH PROPOFOL;  Surgeon: Willis Modena, MD;  Location: [facility];  Service: Gastroenterology;  Laterality: N/A;   EUS N/A 10/11/2021   Procedure: UPPER ENDOSCOPIC ULTRASOUND (EUS) LINEAR;  Surgeon: Willis Modena, MD;  Location: [facility];  Service: Gastroenterology;  Laterality: N/A;   FINE NEEDLE ASPIRATION N/A 10/11/2021   Procedure: FINE NEEDLE ASPIRATION (FNA) LINEAR;  Surgeon: Willis Modena, MD;  Location: [facility];  Service: Gastroenterology;  Laterality: N/A;   INGUINAL HERNIA REPAIR     IR EXCHANGE BILIARY DRAIN  01/22/2022   IR EXCHANGE BILIARY DRAIN  01/26/2022   IR EXCHANGE BILIARY DRAIN  03/05/2022   IR PERC CHOLECYSTOSTOMY  01/02/2022   KNEE ARTHROPLASTY Right 05/16/2017   Procedure: RIGHT TOTAL KNEE ARTHROPLASTY WITH COMPUTER NAVIGATION;  Surgeon: Samson Frederic, MD;  Location: [facility];  Service: Orthopedics;  Laterality: Right;  Needs RNFA   NECK SURGERY     SPHINCTEROTOMY  10/11/2021   Procedure: SPHINCTEROTOMY;  Surgeon: Meryl Dare, MD;  Location: [facility];  Service: Gastroenterology;;   Dennison Mascot  11/21/2021   Procedure: Dennison Mascot;  Surgeon: Vida Rigger, MD;  Location: [facility];  Service: Gastroenterology;;   Burman Freestone CHOLANGIOSCOPY N/A 11/21/2021   Procedure: ZOXWRUEA CHOLANGIOSCOPY;  Surgeon: Vida Rigger, MD;  Location: [facility];  Service: Gastroenterology;  Laterality: N/A;   STENT REMOVAL  11/21/2021   Procedure: STENT  REMOVAL;  Surgeon: Vida Rigger, MD;  Location: [facility];  Service: Gastroenterology;;   TONSILLECTOMY      I have reviewed the social history and family history with the patient and they are unchanged from previous note.  ALLERGIES:  has No Known Allergies.  MEDICATIONS:  Current Outpatient Medications  Medication Sig Dispense Refill   acetaminophen (TYLENOL) 500 MG tablet Take 1,000 mg by mouth daily as needed (pain).     lactulose (CEPHULAC) 20 g packet Take 20 g by mouth 3 (three) times daily. (Patient not taking: Reported on 07/18/2022)     No current facility-administered medications for this visit.    PHYSICAL EXAMINATION: ECOG PERFORMANCE STATUS: 1 - Symptomatic but completely ambulatory  Vitals:   10/05/22 1026  BP: 124/74  Pulse: (!) 54  Resp: 17  Temp: 98.4 F (36.9 C)  SpO2: 98%   Wt Readings from Last 3 Encounters:  10/05/22 175 lb 14.4 oz (79.8 kg)  07/18/22 174 lb 12.8 oz (79.3 kg)  07/06/22 174 lb 8 oz (79.2 kg)     GENERAL:alert, no distress and comfortable SKIN: skin color, texture, turgor are normal, no rashes or significant lesions EYES: normal, Conjunctiva are pink and non-injected, sclera clear NECK:(-) supple, thyroid normal size,  non-tender, without nodularity LYMPH:  (-)no palpable lymphadenopathy in the cervical, axillary  ABDOMEN:(-)abdomen soft,(-) non-tender and (-)normal bowel sounds  LABORATORY DATA:  I have reviewed the data as listed    Latest Ref Rng & Units 10/05/2022   10:07 AM 07/02/2022   10:53 AM 05/07/2022   11:21 AM  CBC  WBC 4.0 - 10.5 K/uL 3.9  4.8  4.0   Hemoglobin 13.0 - 17.0 g/dL 16.1  09.6  04.5   Hematocrit 39.0 - 52.0 % 37.6  38.2  38.0   Platelets 150 - 400 K/uL 194  199  227         Latest Ref Rng & Units 10/05/2022   10:07 AM 07/02/2022   10:53 AM 05/07/2022   11:21 AM  CMP  Glucose 70 - 99 mg/dL 409  71  90   BUN 8 - 23 mg/dL 11  16  14    Creatinine 0.61 - 1.24 mg/dL 8.11  9.14  7.82   Sodium 135 - 145  mmol/L 140  139  140   Potassium 3.5 - 5.1 mmol/L 4.2  4.4  4.5   Chloride 98 - 111 mmol/L 106  106  105   CO2 22 - 32 mmol/L 30  30  32   Calcium 8.9 - 10.3 mg/dL 9.4  9.2  9.4   Total Protein 6.5 - 8.1 g/dL 6.8  7.3  7.2   Total Bilirubin 0.3 - 1.2 mg/dL 0.8  0.7  0.6   Alkaline Phos 38 - 126 U/L 111  102  105   AST 15 - 41 U/L 29  29  24    ALT 0 - 44 U/L 19  21  16        RADIOGRAPHIC STUDIES: I have personally reviewed the radiological images as listed and agreed with the findings in the report. No results found.    Orders Placed This Encounter  Procedures   CT Abdomen Pelvis W Wo Contrast    This exam should ONLY be ordered for initial diagnosis or follow up of known pancreatic/liver/renal/bladder masses.    Standing Status:   Future    Standing Expiration Date:   10/05/2023    Order Specific Question:   If indicated for the ordered procedure, I authorize the administration of contrast media per Radiology protocol    Answer:   Yes    Order Specific Question:   Does the patient have a contrast media/X-ray dye allergy?    Answer:   No    Order Specific Question:   Preferred imaging location?    Answer:   [facility]    Order Specific Question:   Release to patient    Answer:   Immediate [1]    Order Specific Question:   If indicated for the ordered procedure, I authorize the administration of oral contrast media per Radiology protocol    Answer:   Yes   All questions were answered. The patient knows to call the clinic with any problems, questions or concerns. No barriers to learning was detected. The total time spent in the appointment was 25 minutes.     Malachy Mood, MD 10/05/2022   Carolin Coy, CMA, am acting as scribe for Malachy Mood, MD.   I have reviewed the above documentation for accuracy and completeness, and I agree with the above.

## 2022-10-06 LAB — CANCER ANTIGEN 19-9: CA 19-9: 49 U/mL — ABNORMAL HIGH (ref 0–35)

## 2022-10-09 ENCOUNTER — Encounter: Payer: Self-pay | Admitting: Hematology

## 2022-10-15 DIAGNOSIS — E039 Hypothyroidism, unspecified: Secondary | ICD-10-CM | POA: Diagnosis not present

## 2022-11-16 DIAGNOSIS — E039 Hypothyroidism, unspecified: Secondary | ICD-10-CM | POA: Diagnosis not present

## 2022-12-14 DIAGNOSIS — E039 Hypothyroidism, unspecified: Secondary | ICD-10-CM | POA: Diagnosis not present

## 2022-12-14 DIAGNOSIS — C221 Intrahepatic bile duct carcinoma: Secondary | ICD-10-CM | POA: Diagnosis not present

## 2022-12-14 DIAGNOSIS — K838 Other specified diseases of biliary tract: Secondary | ICD-10-CM | POA: Diagnosis not present

## 2022-12-14 DIAGNOSIS — H938X9 Other specified disorders of ear, unspecified ear: Secondary | ICD-10-CM | POA: Diagnosis not present

## 2022-12-17 NOTE — Progress Notes (Signed)
HPI: Follow-up aortic stenosis.  Abdominal ultrasound January 2019 showed no aneurysm.  Last echocardiogram January 2024 showed normal LV function, grade 1 diastolic dysfunction, mild right ventricular enlargement, moderate aortic stenosis with mean gradient 29 mmHg.  Patient also presently undergoing therapy for cholangiocarcinoma.  Since last seen patient denies dyspnea, chest pain, palpitations or syncope.  He is being treated for cholangiocarcinoma as outlined above.  He also has some dementia.  Current Outpatient Medications  Medication Sig Dispense Refill   acetaminophen (TYLENOL) 500 MG tablet Take 1,000 mg by mouth daily as needed (pain).     amoxicillin (AMOXIL) 500 MG tablet Take 500 mg by mouth as needed (PRIOR TO DENTAL APPOINTMENT).     levothyroxine (SYNTHROID) 137 MCG tablet Take 137 mcg by mouth daily before breakfast.     No current facility-administered medications for this visit.     Past Medical History:  Diagnosis Date   Arthritis    Back pain    Heart murmur    History of kidney stones    ICH (intracerebral hemorrhage) (HCC)     Past Surgical History:  Procedure Laterality Date   BACK SURGERY     BILIARY BRUSHING  10/11/2021   Procedure: BILIARY BRUSHING;  Surgeon: Meryl Dare, MD;  Location: [facility];  Service: Gastroenterology;;   BILIARY BRUSHING  11/21/2021   Procedure: BILIARY BRUSHING;  Surgeon: Vida Rigger, MD;  Location: [facility];  Service: Gastroenterology;;   BILIARY STENT PLACEMENT N/A 10/11/2021   Procedure: BILIARY STENT PLACEMENT;  Surgeon: Meryl Dare, MD;  Location: [facility];  Service: Gastroenterology;  Laterality: N/A;   BILIARY STENT PLACEMENT N/A 11/21/2021   Procedure: BILIARY STENT PLACEMENT;  Surgeon: Vida Rigger, MD;  Location: [facility];  Service: Gastroenterology;  Laterality: N/A;   BIOPSY  11/21/2021   Procedure: BIOPSY;  Surgeon: Vida Rigger, MD;  Location: [facility];  Service: Gastroenterology;;   ERCP  N/A 10/11/2021   Procedure: ENDOSCOPIC RETROGRADE CHOLANGIOPANCREATOGRAPHY (ERCP);  Surgeon: Meryl Dare, MD;  Location: [facility];  Service: Gastroenterology;  Laterality: N/A;   ERCP N/A 11/21/2021   Procedure: ENDOSCOPIC RETROGRADE CHOLANGIOPANCREATOGRAPHY (ERCP);  Surgeon: Vida Rigger, MD;  Location: [facility];  Service: Gastroenterology;  Laterality: N/A;  with Spyglass   ESOPHAGOGASTRODUODENOSCOPY (EGD) WITH PROPOFOL N/A 10/11/2021   Procedure: ESOPHAGOGASTRODUODENOSCOPY (EGD) WITH PROPOFOL;  Surgeon: Willis Modena, MD;  Location: [facility];  Service: Gastroenterology;  Laterality: N/A;   EUS N/A 10/11/2021   Procedure: UPPER ENDOSCOPIC ULTRASOUND (EUS) LINEAR;  Surgeon: Willis Modena, MD;  Location: [facility];  Service: Gastroenterology;  Laterality: N/A;   FINE NEEDLE ASPIRATION N/A 10/11/2021   Procedure: FINE NEEDLE ASPIRATION (FNA) LINEAR;  Surgeon: Willis Modena, MD;  Location: [facility];  Service: Gastroenterology;  Laterality: N/A;   INGUINAL HERNIA REPAIR     IR EXCHANGE BILIARY DRAIN  01/22/2022   IR EXCHANGE BILIARY DRAIN  01/26/2022   IR EXCHANGE BILIARY DRAIN  03/05/2022   IR PERC CHOLECYSTOSTOMY  01/02/2022   KNEE ARTHROPLASTY Right 05/16/2017   Procedure: RIGHT TOTAL KNEE ARTHROPLASTY WITH COMPUTER NAVIGATION;  Surgeon: Samson Frederic, MD;  Location: [facility];  Service: Orthopedics;  Laterality: Right;  Needs RNFA   NECK SURGERY     SPHINCTEROTOMY  10/11/2021   Procedure: SPHINCTEROTOMY;  Surgeon: Meryl Dare, MD;  Location: [facility];  Service: Gastroenterology;;   Dennison Mascot  11/21/2021   Procedure: Dennison Mascot;  Surgeon: Vida Rigger, MD;  Location: [facility];  Service: Gastroenterology;;   Burman Freestone CHOLANGIOSCOPY  N/A 11/21/2021   Procedure: ZOXWRUEA CHOLANGIOSCOPY;  Surgeon: Vida Rigger, MD;  Location: [facility];  Service: Gastroenterology;  Laterality: N/A;   STENT REMOVAL  11/21/2021   Procedure: STENT REMOVAL;  Surgeon: Vida Rigger, MD;   Location: [facility];  Service: Gastroenterology;;   TONSILLECTOMY      Social History   Socioeconomic History   Marital status: Married    Spouse name: Not on file   Number of children: 3   Years of education: Not on file   Highest education level: Not on file  Occupational History    Comment: Retired  Tobacco Use   Smoking status: Former   Smokeless tobacco: Never  Advertising account planner   Vaping status: Never Used  Substance and Sexual Activity   Alcohol use: Yes    Comment: one beer per day   Drug use: No   Sexual activity: Not on file  Other Topics Concern   Not on file  Social History Narrative   Not on file   Social Determinants of Health   Financial Resource Strain: Not on file  Food Insecurity: Not on file  Transportation Needs: Not on file  Physical Activity: Not on file  Stress: Not on file  Social Connections: Not on file  Intimate Partner Violence: Not on file    Family History  Problem Relation Age of Onset   Diabetes Mother     ROS: no fevers or chills, productive cough, hemoptysis, dysphasia, odynophagia, melena, hematochezia, dysuria, hematuria, rash, seizure activity, orthopnea, PND, pedal edema, claudication. Remaining systems are negative.  Physical Exam: Well-developed well-nourished in no acute distress.  Skin is warm and dry.  HEENT is normal.  Neck is supple.  Chest is clear to auscultation with normal expansion.  Cardiovascular exam is regular rate and rhythm.  2 every 6 systolic murmur left sternal border. Abdominal exam nontender or distended. No masses palpated. Extremities show no edema. neuro grossly intact  A/P  1 aortic stenosis-moderate on his most recent echocardiogram.  He is scheduled for follow-up echocardiogram January 2025.  He is not having symptoms at present.  Ability to proceed with TAVR in the future will be affected by his diagnosis of cholangiocarcinoma and prognosis.  2 cholangiocarcinoma-managed by oncology.  Olga Millers, MD

## 2022-12-25 ENCOUNTER — Encounter: Payer: Self-pay | Admitting: Cardiology

## 2022-12-25 ENCOUNTER — Ambulatory Visit: Payer: Medicare Other | Attending: Cardiology | Admitting: Cardiology

## 2022-12-25 VITALS — BP 128/74 | HR 54 | Ht 67.0 in | Wt 174.0 lb

## 2022-12-25 DIAGNOSIS — I350 Nonrheumatic aortic (valve) stenosis: Secondary | ICD-10-CM | POA: Diagnosis not present

## 2022-12-25 NOTE — Patient Instructions (Signed)
Medication Instructions:  Your physician recommends that you continue on your current medications as directed. Please refer to the Current Medication list given to you today.  *If you need a refill on your cardiac medications before your next appointment, please call your pharmacy*   Lab Work: None   Testing/Procedures: Your physician has requested that you have an echocardiogram - Jan 2025. Echocardiography is a painless test that uses sound waves to create images of your heart. It provides your doctor with information about the size and shape of your heart and how well your heart's chambers and valves are working. This procedure takes approximately one hour. There are no restrictions for this procedure. Please do NOT wear cologne, perfume, aftershave, or lotions (deodorant is allowed). Please arrive 15 minutes prior to your appointment time.    Follow-Up: At [facility], you and your health needs are our priority.  As part of our continuing mission to provide you with exceptional heart care, we have created designated Provider Care Teams.  These Care Teams include your primary Cardiologist (physician) and Advanced Practice Providers (APPs -  Physician Assistants and Nurse Practitioners) who all work together to provide you with the care you need, when you need it.   Your next appointment:   6 month(s)  Provider:   Olga Millers, MD

## 2022-12-28 ENCOUNTER — Other Ambulatory Visit: Payer: Self-pay

## 2022-12-28 DIAGNOSIS — C221 Intrahepatic bile duct carcinoma: Secondary | ICD-10-CM

## 2022-12-31 ENCOUNTER — Inpatient Hospital Stay: Payer: Medicare Other | Attending: Physician Assistant

## 2022-12-31 ENCOUNTER — Ambulatory Visit (HOSPITAL_COMMUNITY)
Admission: RE | Admit: 2022-12-31 | Discharge: 2022-12-31 | Disposition: A | Discharge status: Home / Self Care | Payer: Medicare Other | Source: Ambulatory Visit | Attending: Hematology | Admitting: Hematology

## 2022-12-31 ENCOUNTER — Other Ambulatory Visit: Payer: Self-pay | Admitting: Hematology

## 2022-12-31 DIAGNOSIS — K862 Cyst of pancreas: Secondary | ICD-10-CM | POA: Diagnosis not present

## 2022-12-31 DIAGNOSIS — R599 Enlarged lymph nodes, unspecified: Secondary | ICD-10-CM | POA: Diagnosis not present

## 2022-12-31 DIAGNOSIS — C221 Intrahepatic bile duct carcinoma: Secondary | ICD-10-CM

## 2022-12-31 DIAGNOSIS — I700 Atherosclerosis of aorta: Secondary | ICD-10-CM | POA: Insufficient documentation

## 2022-12-31 DIAGNOSIS — I358 Other nonrheumatic aortic valve disorders: Secondary | ICD-10-CM | POA: Diagnosis not present

## 2022-12-31 DIAGNOSIS — Z9221 Personal history of antineoplastic chemotherapy: Secondary | ICD-10-CM | POA: Insufficient documentation

## 2022-12-31 DIAGNOSIS — K449 Diaphragmatic hernia without obstruction or gangrene: Secondary | ICD-10-CM | POA: Insufficient documentation

## 2022-12-31 DIAGNOSIS — Z923 Personal history of irradiation: Secondary | ICD-10-CM | POA: Insufficient documentation

## 2022-12-31 DIAGNOSIS — K5790 Diverticulosis of intestine, part unspecified, without perforation or abscess without bleeding: Secondary | ICD-10-CM | POA: Diagnosis not present

## 2022-12-31 DIAGNOSIS — K429 Umbilical hernia without obstruction or gangrene: Secondary | ICD-10-CM | POA: Insufficient documentation

## 2022-12-31 DIAGNOSIS — R059 Cough, unspecified: Secondary | ICD-10-CM | POA: Insufficient documentation

## 2022-12-31 LAB — CBC WITH DIFFERENTIAL (CANCER CENTER ONLY)
Abs Immature Granulocytes: 0.01 10*3/uL (ref 0.00–0.07)
Basophils Absolute: 0 10*3/uL (ref 0.0–0.1)
Basophils Relative: 0 %
Eosinophils Absolute: 0.1 10*3/uL (ref 0.0–0.5)
Eosinophils Relative: 3 %
HCT: 38.1 % — ABNORMAL LOW (ref 39.0–52.0)
Hemoglobin: 12.7 g/dL — ABNORMAL LOW (ref 13.0–17.0)
Immature Granulocytes: 0 %
Lymphocytes Relative: 16 %
Lymphs Abs: 0.6 10*3/uL — ABNORMAL LOW (ref 0.7–4.0)
MCH: 33.3 pg (ref 26.0–34.0)
MCHC: 33.3 g/dL (ref 30.0–36.0)
MCV: 100 fL (ref 80.0–100.0)
Monocytes Absolute: 0.7 10*3/uL (ref 0.1–1.0)
Monocytes Relative: 19 %
Neutro Abs: 2.5 10*3/uL (ref 1.7–7.7)
Neutrophils Relative %: 62 %
Platelet Count: 203 10*3/uL (ref 150–400)
RBC: 3.81 MIL/uL — ABNORMAL LOW (ref 4.22–5.81)
RDW: 13.2 % (ref 11.5–15.5)
WBC Count: 3.9 10*3/uL — ABNORMAL LOW (ref 4.0–10.5)
nRBC: 0 % (ref 0.0–0.2)

## 2022-12-31 LAB — CMP (CANCER CENTER ONLY)
ALT: 34 U/L (ref 0–44)
AST: 32 U/L (ref 15–41)
Albumin: 3.7 g/dL (ref 3.5–5.0)
Alkaline Phosphatase: 109 U/L (ref 38–126)
Anion gap: 4 — ABNORMAL LOW (ref 5–15)
BUN: 16 mg/dL (ref 8–23)
CO2: 30 mmol/L (ref 22–32)
Calcium: 9.3 mg/dL (ref 8.9–10.3)
Chloride: 105 mmol/L (ref 98–111)
Creatinine: 1.08 mg/dL (ref 0.61–1.24)
GFR, Estimated: >60 mL/min (ref >60)
Glucose, Bld: 89 mg/dL (ref 70–99)
Potassium: 4.1 mmol/L (ref 3.5–5.1)
Sodium: 139 mmol/L (ref 135–145)
Total Bilirubin: 0.9 mg/dL (ref 0.3–1.2)
Total Protein: 7 g/dL (ref 6.5–8.1)

## 2022-12-31 LAB — CEA (ACCESS): CEA (CHCC): 4.93 ng/mL (ref 0.00–5.00)

## 2022-12-31 MED ORDER — SODIUM CHLORIDE (PF) 0.9 % IJ SOLN
INTRAMUSCULAR | Status: AC
  Filled 2022-12-31: qty 50

## 2022-12-31 MED ORDER — IOHEXOL 300 MG/ML  SOLN
100.0000 mL | Freq: Once | INTRAMUSCULAR | Status: AC | PRN
  Administered 2022-12-31: 100 mL via INTRAVENOUS

## 2023-01-06 NOTE — Assessment & Plan Note (Signed)
cTxN0M0 -presented with elevated LFT's, jaundice, 10-15 lb weight loss. CT AP 10/06/21 showed biliary obstruction, he was referred to ED. -baseline CA 19-9 on 10/06/21 elevated to 129. -s/p ERCP 10/11/21 inpatient showed a mass in common bile duct, cytology not diagnostic showing only suspicious atypical cells. S/p stent placement  -staging chest CT 11/03/21 was negative. -he met Dr. Freida Busman on 11/08/21 and was offered surgery, but pt declined. -repeat ERCP on 11/21/21 for stent change, path and cytology were again non-diagnostic but suspicious. -he received radiation 12/04/21 - 01/16/22. He tried concurrent Xeloda for a week but tolerated poorly with nausea, weight loss, and rash to his hands and face, discontinued 8/22.  -He developed cholecystitis during treatment which was managed with percutaneous cholecystostomy  -restaging CT 07/02/2022 showed Decompression of the gallbladder with resolution of adjacent fluid collection since previous imaging, no evidence of cancer progression  -restaging CT 9/9 showed stable soft tissue in porta hepatis, no evidence of cancer progression.  His tumor marker CA 19.9 with elevated and trending up 3 months ago, today's results still pending.  If still going up, will obtain CT chest also.  He has noticed some dry cough lately, no other pulmonary symptoms.

## 2023-01-07 ENCOUNTER — Inpatient Hospital Stay (HOSPITAL_BASED_OUTPATIENT_CLINIC_OR_DEPARTMENT_OTHER): Payer: Medicare Other | Admitting: Hematology

## 2023-01-07 ENCOUNTER — Encounter: Payer: Self-pay | Admitting: Hematology

## 2023-01-07 ENCOUNTER — Inpatient Hospital Stay: Payer: Medicare Other

## 2023-01-07 VITALS — BP 109/69 | HR 58 | Temp 98.2°F | Resp 15 | Ht 67.0 in | Wt 179.2 lb

## 2023-01-07 DIAGNOSIS — Z9221 Personal history of antineoplastic chemotherapy: Secondary | ICD-10-CM | POA: Diagnosis not present

## 2023-01-07 DIAGNOSIS — R059 Cough, unspecified: Secondary | ICD-10-CM | POA: Diagnosis not present

## 2023-01-07 DIAGNOSIS — Z923 Personal history of irradiation: Secondary | ICD-10-CM | POA: Diagnosis not present

## 2023-01-07 DIAGNOSIS — C221 Intrahepatic bile duct carcinoma: Secondary | ICD-10-CM

## 2023-01-07 NOTE — Progress Notes (Signed)
[facility]   Telephone:([phone] Fax:(336) [phone]   Clinic Follow up Note   Patient Care Team: Farris Has, MD as PCP - General (Family Medicine) Jens Som Madolyn Frieze, MD as PCP - Cardiology (Cardiology) Jens Som Madolyn Frieze, MD as Consulting Physician (Cardiology) Malachy Mood, MD as Consulting Physician (Hematology and Oncology)  Date of Service:  01/07/2023  CHIEF COMPLAINT: f/u of  cholangiocarcinoma   CURRENT THERAPY:   Surveillance  ASSESSMENT:  Patrick Collier is a 82 y.o. male with   Cholangiocarcinoma (HCC) cTxN0M0 -presented with elevated LFT's, jaundice, 10-15 lb weight loss. CT AP 10/06/21 showed biliary obstruction, he was referred to ED. -baseline CA 19-9 on 10/06/21 elevated to 129. -s/p ERCP 10/11/21 inpatient showed a mass in common bile duct, cytology not diagnostic showing only suspicious atypical cells. S/p stent placement  -staging chest CT 11/03/21 was negative. -he met Dr. Freida Busman on 11/08/21 and was offered surgery, but pt declined. -repeat ERCP on 11/21/21 for stent change, path and cytology were again non-diagnostic but suspicious. -he received radiation 12/04/21 - 01/16/22. He tried concurrent Xeloda for a week but tolerated poorly with nausea, weight loss, and rash to his hands and face, discontinued 8/22.  -He developed cholecystitis during treatment which was managed with percutaneous cholecystostomy  -restaging CT 07/02/2022 showed Decompression of the gallbladder with resolution of adjacent fluid collection since previous imaging, no evidence of cancer progression  -restaging CT 9/9 showed stable soft tissue in porta hepatis, no evidence of cancer progression.  His tumor marker CA 19.9 with elevated and trending up 3 months ago, today's results still pending.  If still going up, will obtain CT chest also.  He has noticed some dry cough lately, no other pulmonary symptoms.     PLAN: -lab reviewed - I reviewed CT scan w/ pt -repeat Lab for  tumor marker today -If tumor marker elevated will recommend CT chest in next few weeks. -lab and f/u in 3 months  SUMMARY OF ONCOLOGIC HISTORY: Oncology History Overview Note   Cancer Staging  Cholangiocarcinoma [facility]) Staging form: Distal Bile Duct, AJCC 8th Edition - Clinical stage from 11/21/2021: Stage Unknown (cTX, cN0, cM0) - Signed by Malachy Mood, MD on 12/12/2021 Total positive nodes: 0     Cholangiocarcinoma (HCC)  10/07/2021 Imaging   MR ABDOMEN MRCP W WO CONTAST  IMPRESSION: 1. Severe biliary ductal dilation of both the intra and extrahepatic bile ducts with abrupt cutoff seen at the distal common bile duct. Findings are concerning for stricture or obstructing tumor. No obstructing mass is seen, although motion artifact on contrast-enhanced imaging limits evaluation. Recommend correlation with ERCP. 2. Small cystic lesions of the pancreatic tail, largest measures 10 mm. Recommend follow up pre and post contrast MRI/MRCP or pancreatic protocol CT in 2 years. This recommendation follows ACR consensus guidelines: Management of Incidental Pancreatic Cysts: A White Paper of the ACR Incidental Findings Committee. J Am Coll Radiol 2017;14:911-923. 3. Trace perihepatic ascites and trace bilateral pleural effusions.   10/11/2021 Procedure   ERCP-Dr. Russella Dar  Impression:  -I single localized malignant appearing severe biliary stricture was found in the lower third of the main bile duct. -The proximal biliary tree was dilated, secondary to stricture -Biliary sphincterotomy was performed -Cells for cytology obtained in the lower third of the main duct -One plastic stent was placed into the common bile duct.     10/11/2021 Pathology Results   CYTOLOGY - NON PAP  CASE: [id number]  PATIENT: Caren Macadam  Non-Gynecological Cytology Report  FINAL MICROSCOPIC DIAGNOSIS:  A. COMMON BILE DUCT, LESION, FINE NEEDLE ASPIRATION:  - No malignant cells seen  -Scant cellularity;  benign/reactive ductal cells and ductal epithelium  with acute inflammatory cells   B. COMMON BILE DUCT, STRICTURE, BRUSHING:  - Atypical cells suspicious for tumor present    10/26/2021 Initial Diagnosis   Cholangiocarcinoma (HCC)    Procedure   Upper EUS-Dr. Dulce Sellar  Impression:  - There was no sign of significant pathology in the ampulla. - A mass was found in the common bile duct. Fine needle aspiration performed. - There was no sign of significant pathology in the pancreatic head, genu of the pancreas and pancreatic body. - A few abnormal lymph nodes were visualized in the peripancreatic region and porta hepatis region. - There was dilation in the middle third of the main bile duct, in the upper third of the main bile duct and in the common hepatic duct which measured up to 15 mm. - Small perihepatic ascites. - Overall constellation findings most consistent mid common bile duct cholangiocarcinoma.   11/21/2021 Cancer Staging   Staging form: Distal Bile Duct, AJCC 8th Edition - Clinical stage from 11/21/2021: Stage Unknown (cTX, cN0, cM0) - Signed by Malachy Mood, MD on 12/12/2021 Total positive nodes: 0   07/02/2022 Imaging    IMPRESSION: 1. Decompression of the gallbladder with resolution of adjacent fluid collection since previous imaging. 2. Mild stranding about the porta hepatis and stranding/soft tissue density surrounding replaced RIGHT hepatic artery which arises from the SMA. 3. Under distension versus thickening of the hepatic flexure of the colon adjacent to the hepatic duodenal ligament just inferior to the gallbladder. No substantial surrounding stranding in this area. Correlate with any symptoms of colitis, this is favored to represent decompression versus mild secondary changes adjacent of the colon in the setting of previous inflammatory change. Could consider short interval follow-up or Cologuard correlation. Could also consider direct visualization as warranted  for further assessment as a more aggressive approach. 4. Stable cystic lesion in the body-tail of the pancreas. This may represent a small pseudocyst or indolent cystic neoplasm. Attention on follow-up. 5. Aortic atherosclerosis. 6. Fat containing LEFT inguinal and umbilical hernias are small. 7. Signs of aortic valve and mitral valve calcification. 8. Colonic diverticulosis.      INTERVAL HISTORY:  Patrick Collier is here for a follow up of  cholangiocarcinoma. He was last seen by me on 10/05/2022. He presents to the clinic accompanied by wife. Pt state that he lost his son. Pt state that he has some abdominal pain and cough. The cough is dry and his voice his hoariness and it come and goes. Pt denies having any pain and dyspnea.    All other systems were reviewed with the patient and are negative.  MEDICAL HISTORY:  Past Medical History:  Diagnosis Date   Arthritis    Back pain    Heart murmur    History of kidney stones    ICH (intracerebral hemorrhage) (HCC)     SURGICAL HISTORY: Past Surgical History:  Procedure Laterality Date   BACK SURGERY     BILIARY BRUSHING  10/11/2021   Procedure: BILIARY BRUSHING;  Surgeon: Meryl Dare, MD;  Location: [facility];  Service: Gastroenterology;;   BILIARY BRUSHING  11/21/2021   Procedure: BILIARY BRUSHING;  Surgeon: Vida Rigger, MD;  Location: [facility];  Service: Gastroenterology;;   BILIARY STENT PLACEMENT N/A 10/11/2021   Procedure: BILIARY STENT PLACEMENT;  Surgeon: Meryl Dare, MD;  Location: [facility];  Service: Gastroenterology;  Laterality: N/A;   BILIARY STENT PLACEMENT N/A 11/21/2021   Procedure: BILIARY STENT PLACEMENT;  Surgeon: Vida Rigger, MD;  Location: [facility];  Service: Gastroenterology;  Laterality: N/A;   BIOPSY  11/21/2021   Procedure: BIOPSY;  Surgeon: Vida Rigger, MD;  Location: [facility];  Service: Gastroenterology;;   ERCP N/A 10/11/2021   Procedure: ENDOSCOPIC RETROGRADE  CHOLANGIOPANCREATOGRAPHY (ERCP);  Surgeon: Meryl Dare, MD;  Location: [facility];  Service: Gastroenterology;  Laterality: N/A;   ERCP N/A 11/21/2021   Procedure: ENDOSCOPIC RETROGRADE CHOLANGIOPANCREATOGRAPHY (ERCP);  Surgeon: Vida Rigger, MD;  Location: [facility];  Service: Gastroenterology;  Laterality: N/A;  with Spyglass   ESOPHAGOGASTRODUODENOSCOPY (EGD) WITH PROPOFOL N/A 10/11/2021   Procedure: ESOPHAGOGASTRODUODENOSCOPY (EGD) WITH PROPOFOL;  Surgeon: Willis Modena, MD;  Location: [facility];  Service: Gastroenterology;  Laterality: N/A;   EUS N/A 10/11/2021   Procedure: UPPER ENDOSCOPIC ULTRASOUND (EUS) LINEAR;  Surgeon: Willis Modena, MD;  Location: [facility];  Service: Gastroenterology;  Laterality: N/A;   FINE NEEDLE ASPIRATION N/A 10/11/2021   Procedure: FINE NEEDLE ASPIRATION (FNA) LINEAR;  Surgeon: Willis Modena, MD;  Location: [facility];  Service: Gastroenterology;  Laterality: N/A;   INGUINAL HERNIA REPAIR     IR EXCHANGE BILIARY DRAIN  01/22/2022   IR EXCHANGE BILIARY DRAIN  01/26/2022   IR EXCHANGE BILIARY DRAIN  03/05/2022   IR PERC CHOLECYSTOSTOMY  01/02/2022   KNEE ARTHROPLASTY Right 05/16/2017   Procedure: RIGHT TOTAL KNEE ARTHROPLASTY WITH COMPUTER NAVIGATION;  Surgeon: Samson Frederic, MD;  Location: [facility];  Service: Orthopedics;  Laterality: Right;  Needs RNFA   NECK SURGERY     SPHINCTEROTOMY  10/11/2021   Procedure: SPHINCTEROTOMY;  Surgeon: Meryl Dare, MD;  Location: [facility];  Service: Gastroenterology;;   Dennison Mascot  11/21/2021   Procedure: Dennison Mascot;  Surgeon: Vida Rigger, MD;  Location: [facility];  Service: Gastroenterology;;   Burman Freestone CHOLANGIOSCOPY N/A 11/21/2021   Procedure: NATFTDDU CHOLANGIOSCOPY;  Surgeon: Vida Rigger, MD;  Location: [facility];  Service: Gastroenterology;  Laterality: N/A;   STENT REMOVAL  11/21/2021   Procedure: STENT REMOVAL;  Surgeon: Vida Rigger, MD;  Location: [facility];  Service:  Gastroenterology;;   TONSILLECTOMY      I have reviewed the social history and family history with the patient and they are unchanged from previous note.  ALLERGIES:  has No Known Allergies.  MEDICATIONS:  Current Outpatient Medications  Medication Sig Dispense Refill   acetaminophen (TYLENOL) 500 MG tablet Take 1,000 mg by mouth daily as needed (pain).     amoxicillin (AMOXIL) 500 MG tablet Take 500 mg by mouth as needed (PRIOR TO DENTAL APPOINTMENT).     levothyroxine (SYNTHROID) 137 MCG tablet Take 137 mcg by mouth daily before breakfast.     No current facility-administered medications for this visit.    PHYSICAL EXAMINATION: ECOG PERFORMANCE STATUS: 0 - Asymptomatic  Vitals:   01/07/23 1058  BP: 109/69  Pulse: (!) 58  Resp: 15  Temp: 98.2 F (36.8 C)  SpO2: 97%   Wt Readings from Last 3 Encounters:  01/07/23 179 lb 3.2 oz (81.3 kg)  12/25/22 174 lb (78.9 kg)  10/05/22 175 lb 14.4 oz (79.8 kg)     GENERAL:alert, no distress and comfortable SKIN: skin color normal, no rashes or significant lesions EYES: normal, Conjunctiva are pink and non-injected, sclera clear  NEURO: alert & oriented x 3 with fluent speech LABORATORY DATA:  I have reviewed the data as listed  Latest Ref Rng & Units 12/31/2022   10:35 AM 10/05/2022   10:07 AM 07/02/2022   10:53 AM  CBC  WBC 4.0 - 10.5 K/uL 3.9  3.9  4.8   Hemoglobin 13.0 - 17.0 g/dL 60.4  54.0  98.1   Hematocrit 39.0 - 52.0 % 38.1  37.6  38.2   Platelets 150 - 400 K/uL 203  194  199         Latest Ref Rng & Units 12/31/2022   10:35 AM 10/05/2022   10:07 AM 07/02/2022   10:53 AM  CMP  Glucose 70 - 99 mg/dL 89  191  71   BUN 8 - 23 mg/dL 16  11  16    Creatinine 0.61 - 1.24 mg/dL 4.78  2.95  6.21   Sodium 135 - 145 mmol/L 139  140  139   Potassium 3.5 - 5.1 mmol/L 4.1  4.2  4.4   Chloride 98 - 111 mmol/L 105  106  106   CO2 22 - 32 mmol/L 30  30  30    Calcium 8.9 - 10.3 mg/dL 9.3  9.4  9.2   Total Protein 6.5 - 8.1  g/dL 7.0  6.8  7.3   Total Bilirubin 0.3 - 1.2 mg/dL 0.9  0.8  0.7   Alkaline Phos 38 - 126 U/L 109  111  102   AST 15 - 41 U/L 32  29  29   ALT 0 - 44 U/L 34  19  21       RADIOGRAPHIC STUDIES: I have personally reviewed the radiological images as listed and agreed with the findings in the report. No results found.    Orders Placed This Encounter  Procedures   Cancer antigen 19-9    Standing Status:   Standing    Number of Occurrences:   20    Standing Expiration Date:   01/07/2024   All questions were answered. The patient knows to call the clinic with any problems, questions or concerns. No barriers to learning was detected. The total time spent in the appointment was 25 minutes.     Malachy Mood, MD 01/07/2023   Carolin Coy, CMA, am acting as scribe for Malachy Mood, MD.   I have reviewed the above documentation for accuracy and completeness, and I agree with the above.

## 2023-01-08 ENCOUNTER — Encounter: Payer: Self-pay | Admitting: Hematology

## 2023-01-08 LAB — CANCER ANTIGEN 19-9: CA 19-9: 146 U/mL — ABNORMAL HIGH (ref 0–35)

## 2023-01-10 ENCOUNTER — Other Ambulatory Visit: Payer: Self-pay

## 2023-01-14 ENCOUNTER — Other Ambulatory Visit: Payer: Self-pay

## 2023-01-14 ENCOUNTER — Telehealth: Payer: Self-pay | Admitting: Hematology

## 2023-01-14 ENCOUNTER — Other Ambulatory Visit: Payer: Self-pay | Admitting: Nurse Practitioner

## 2023-01-14 DIAGNOSIS — C221 Intrahepatic bile duct carcinoma: Secondary | ICD-10-CM

## 2023-01-14 NOTE — Telephone Encounter (Signed)
Looks like a CT chest with contrast is what Dr. Mosetta Putt wanted to do if Ca 19.9 was elevated, which it is. I am not able to place the order for that yet though.

## 2023-01-15 DIAGNOSIS — E039 Hypothyroidism, unspecified: Secondary | ICD-10-CM | POA: Diagnosis not present

## 2023-01-15 DIAGNOSIS — Z23 Encounter for immunization: Secondary | ICD-10-CM | POA: Diagnosis not present

## 2023-01-23 ENCOUNTER — Inpatient Hospital Stay: Payer: Medicare Other | Attending: Physician Assistant

## 2023-01-23 ENCOUNTER — Ambulatory Visit (HOSPITAL_COMMUNITY)
Admission: RE | Admit: 2023-01-23 | Discharge: 2023-01-23 | Disposition: A | Discharge status: Home / Self Care | Payer: Medicare Other | Source: Ambulatory Visit | Attending: Nurse Practitioner | Admitting: Nurse Practitioner

## 2023-01-23 DIAGNOSIS — R748 Abnormal levels of other serum enzymes: Secondary | ICD-10-CM | POA: Diagnosis not present

## 2023-01-23 DIAGNOSIS — C221 Intrahepatic bile duct carcinoma: Secondary | ICD-10-CM | POA: Insufficient documentation

## 2023-01-23 DIAGNOSIS — Z923 Personal history of irradiation: Secondary | ICD-10-CM | POA: Insufficient documentation

## 2023-01-23 DIAGNOSIS — I700 Atherosclerosis of aorta: Secondary | ICD-10-CM | POA: Diagnosis not present

## 2023-01-23 DIAGNOSIS — Z9221 Personal history of antineoplastic chemotherapy: Secondary | ICD-10-CM | POA: Diagnosis not present

## 2023-01-23 LAB — COMPREHENSIVE METABOLIC PANEL
ALT: 82 U/L — ABNORMAL HIGH (ref 0–44)
AST: 59 U/L — ABNORMAL HIGH (ref 15–41)
Albumin: 3.4 g/dL — ABNORMAL LOW (ref 3.5–5.0)
Alkaline Phosphatase: 319 U/L — ABNORMAL HIGH (ref 38–126)
Anion gap: 3 — ABNORMAL LOW (ref 5–15)
BUN: 9 mg/dL (ref 8–23)
CO2: 32 mmol/L (ref 22–32)
Calcium: 8.9 mg/dL (ref 8.9–10.3)
Chloride: 104 mmol/L (ref 98–111)
Creatinine, Ser: 1.14 mg/dL (ref 0.61–1.24)
GFR, Estimated: >60 mL/min (ref >60)
Glucose, Bld: 100 mg/dL — ABNORMAL HIGH (ref 70–99)
Potassium: 4.1 mmol/L (ref 3.5–5.1)
Sodium: 139 mmol/L (ref 135–145)
Total Bilirubin: 1 mg/dL (ref 0.3–1.2)
Total Protein: 7.1 g/dL (ref 6.5–8.1)

## 2023-01-23 LAB — CBC WITH DIFFERENTIAL/PLATELET
Abs Immature Granulocytes: 0.06 10*3/uL (ref 0.00–0.07)
Basophils Absolute: 0 10*3/uL (ref 0.0–0.1)
Basophils Relative: 0 %
Eosinophils Absolute: 0.1 10*3/uL (ref 0.0–0.5)
Eosinophils Relative: 3 %
HCT: 36.6 % — ABNORMAL LOW (ref 39.0–52.0)
Hemoglobin: 12 g/dL — ABNORMAL LOW (ref 13.0–17.0)
Immature Granulocytes: 1 %
Lymphocytes Relative: 17 %
Lymphs Abs: 0.8 10*3/uL (ref 0.7–4.0)
MCH: 32.9 pg (ref 26.0–34.0)
MCHC: 32.8 g/dL (ref 30.0–36.0)
MCV: 100.3 fL — ABNORMAL HIGH (ref 80.0–100.0)
Monocytes Absolute: 0.7 10*3/uL (ref 0.1–1.0)
Monocytes Relative: 14 %
Neutro Abs: 3.2 10*3/uL (ref 1.7–7.7)
Neutrophils Relative %: 65 %
Platelets: 268 10*3/uL (ref 150–400)
RBC: 3.65 MIL/uL — ABNORMAL LOW (ref 4.22–5.81)
RDW: 13.3 % (ref 11.5–15.5)
WBC: 5 10*3/uL (ref 4.0–10.5)
nRBC: 0 % (ref 0.0–0.2)

## 2023-01-23 MED ORDER — SODIUM CHLORIDE (PF) 0.9 % IJ SOLN
INTRAMUSCULAR | Status: AC
  Filled 2023-01-23: qty 50

## 2023-01-23 MED ORDER — IOHEXOL 300 MG/ML  SOLN
75.0000 mL | Freq: Once | INTRAMUSCULAR | Status: AC | PRN
  Administered 2023-01-23: 75 mL via INTRAVENOUS

## 2023-01-29 ENCOUNTER — Encounter: Payer: Self-pay | Admitting: Hematology

## 2023-01-29 ENCOUNTER — Inpatient Hospital Stay (HOSPITAL_BASED_OUTPATIENT_CLINIC_OR_DEPARTMENT_OTHER): Payer: Medicare Other | Admitting: Hematology

## 2023-01-29 VITALS — BP 110/69 | HR 64 | Temp 98.4°F | Resp 16 | Ht 67.0 in | Wt 175.6 lb

## 2023-01-29 DIAGNOSIS — Z923 Personal history of irradiation: Secondary | ICD-10-CM | POA: Diagnosis not present

## 2023-01-29 DIAGNOSIS — Z9221 Personal history of antineoplastic chemotherapy: Secondary | ICD-10-CM | POA: Diagnosis not present

## 2023-01-29 DIAGNOSIS — R748 Abnormal levels of other serum enzymes: Secondary | ICD-10-CM | POA: Diagnosis not present

## 2023-01-29 DIAGNOSIS — C221 Intrahepatic bile duct carcinoma: Secondary | ICD-10-CM

## 2023-01-29 NOTE — Assessment & Plan Note (Addendum)
cTxN0M0 -presented with elevated LFT's, jaundice, 10-15 lb weight loss. CT AP 10/06/21 showed biliary obstruction, he was referred to ED. -baseline CA 19-9 on 10/06/21 elevated to 129. -s/p ERCP 10/11/21 inpatient showed a mass in common bile duct, cytology not diagnostic showing only suspicious atypical cells. S/p stent placement  -staging chest CT 11/03/21 was negative. -he met Dr. Freida Busman on 11/08/21 and was offered surgery, but pt declined. -repeat ERCP on 11/21/21 for stent change, path and cytology were again non-diagnostic but suspicious. -he received radiation 12/04/21 - 01/16/22. He tried concurrent Xeloda for a week but tolerated poorly with nausea, weight loss, and rash to his hands and face, discontinued 8/22.  -He developed cholecystitis during treatment which was managed with percutaneous cholecystostomy  -restaging CT 07/02/2022 showed Decompression of the gallbladder with resolution of adjacent fluid collection since previous imaging, no evidence of cancer progression  -restaging CT 9/9 showed stable soft tissue in porta hepatis, no evidence of cancer progression.  His tumor marker CA 19.9 has been rising lately -CT chest 01/23/2023 was negative

## 2023-01-29 NOTE — Progress Notes (Signed)
[facility]   [phone] Fax:([phone]   Clinic Follow up Note   Patient Care Team: Farris Has, MD as PCP - General (Family Medicine) Jens Som Madolyn Frieze, MD as PCP - Cardiology (Cardiology) Jens Som Madolyn Frieze, MD as Consulting Physician (Cardiology) Malachy Mood, MD as Consulting Physician (Hematology and Oncology)  Date of Service:  01/29/2023  CHIEF COMPLAINT: f/u of cholangiocarcinoma  CURRENT THERAPY:  Observation  Oncology History   Cholangiocarcinoma (HCC) cTxN0M0 -presented with elevated LFT's, jaundice, 10-15 lb weight loss. CT AP 10/06/21 showed biliary obstruction, he was referred to ED. -baseline CA 19-9 on 10/06/21 elevated to 129. -s/p ERCP 10/11/21 inpatient showed a mass in common bile duct, cytology not diagnostic showing only suspicious atypical cells. S/p stent placement  -staging chest CT 11/03/21 was negative. -he met Dr. Freida Busman on 11/08/21 and was offered surgery, but pt declined. -repeat ERCP on 11/21/21 for stent change, path and cytology were again non-diagnostic but suspicious. -he received radiation 12/04/21 - 01/16/22. He tried concurrent Xeloda for a week but tolerated poorly with nausea, weight loss, and rash to his hands and face, discontinued 8/22.  -He developed cholecystitis during treatment which was managed with percutaneous cholecystostomy  -restaging CT 07/02/2022 showed Decompression of the gallbladder with resolution of adjacent fluid collection since previous imaging, no evidence of cancer progression  -restaging CT 9/9 showed stable soft tissue in porta hepatis, no evidence of cancer progression.  His tumor marker CA 19.9 has been rising lately -CT chest 01/23/2023 was negative     Assessment and Plan    Cholangiocarcinoma Stable on imaging but elevated tumor markers and new symptoms of fatigue, decreased appetite, and weight loss. Discussed the possibility of residual cancer cells and the need for further  investigation. -Order PET scan to assess for active disease. -Consult with Dr. Russella Dar regarding the status of the biliary stent. -Consider appetite stimulant medication if patient's appetite does not improve.  Abnormal Liver Function Elevated liver enzymes and alkaline phosphatase, possibly related to tumor growth or stent obstruction. -Repeat liver function tests in two weeks during PET scan appointment. -Monitor for signs of jaundice and report any changes immediately.  General Health Maintenance -Monitor weight weekly. -Consider resuming protein drinks for nutritional support. -Observe for signs of infection such as pain, tenderness, fever, chills, and jaundice.     Plan -Lab and CT chest were reviewed -Will order PET scan to be done in the next few weeks for further evaluation -I will Start to start to see if he recommends checking biliary stent due to his worsening LFT    SUMMARY OF ONCOLOGIC HISTORY: Oncology History Overview Note   Cancer Staging  Cholangiocarcinoma [facility]) Staging form: Distal Bile Duct, AJCC 8th Edition - Clinical stage from 11/21/2021: Stage Unknown (cTX, cN0, cM0) - Signed by Malachy Mood, MD on 12/12/2021 Total positive nodes: 0     Cholangiocarcinoma (HCC)  10/07/2021 Imaging   MR ABDOMEN MRCP W WO CONTAST  IMPRESSION: 1. Severe biliary ductal dilation of both the intra and extrahepatic bile ducts with abrupt cutoff seen at the distal common bile duct. Findings are concerning for stricture or obstructing tumor. No obstructing mass is seen, although motion artifact on contrast-enhanced imaging limits evaluation. Recommend correlation with ERCP. 2. Small cystic lesions of the pancreatic tail, largest measures 10 mm. Recommend follow up pre and post contrast MRI/MRCP or pancreatic protocol CT in 2 years. This recommendation follows ACR consensus guidelines: Management of Incidental Pancreatic Cysts: A White Paper of the  ACR Incidental Findings Committee.  J Am Coll Radiol 2017;14:911-923. 3. Trace perihepatic ascites and trace bilateral pleural effusions.   10/11/2021 Procedure   ERCP-Dr. Russella Dar  Impression:  -I single localized malignant appearing severe biliary stricture was found in the lower third of the main bile duct. -The proximal biliary tree was dilated, secondary to stricture -Biliary sphincterotomy was performed -Cells for cytology obtained in the lower third of the main duct -One plastic stent was placed into the common bile duct.     10/11/2021 Pathology Results   CYTOLOGY - NON PAP  CASE: [id number]  PATIENT: Patrick Collier  Non-Gynecological Cytology Report   FINAL MICROSCOPIC DIAGNOSIS:  A. COMMON BILE DUCT, LESION, FINE NEEDLE ASPIRATION:  - No malignant cells seen  -Scant cellularity; benign/reactive ductal cells and ductal epithelium  with acute inflammatory cells   B. COMMON BILE DUCT, STRICTURE, BRUSHING:  - Atypical cells suspicious for tumor present    10/26/2021 Initial Diagnosis   Cholangiocarcinoma (HCC)    Procedure   Upper EUS-Dr. Dulce Sellar  Impression:  - There was no sign of significant pathology in the ampulla. - A mass was found in the common bile duct. Fine needle aspiration performed. - There was no sign of significant pathology in the pancreatic head, genu of the pancreas and pancreatic body. - A few abnormal lymph nodes were visualized in the peripancreatic region and porta hepatis region. - There was dilation in the middle third of the main bile duct, in the upper third of the main bile duct and in the common hepatic duct which measured up to 15 mm. - Small perihepatic ascites. - Overall constellation findings most consistent mid common bile duct cholangiocarcinoma.   11/21/2021 Cancer Staging   Staging form: Distal Bile Duct, AJCC 8th Edition - Clinical stage from 11/21/2021: Stage Unknown (cTX, cN0, cM0) - Signed by Malachy Mood, MD on 12/12/2021 Total positive nodes: 0   07/02/2022  Imaging    IMPRESSION: 1. Decompression of the gallbladder with resolution of adjacent fluid collection since previous imaging. 2. Mild stranding about the porta hepatis and stranding/soft tissue density surrounding replaced RIGHT hepatic artery which arises from the SMA. 3. Under distension versus thickening of the hepatic flexure of the colon adjacent to the hepatic duodenal ligament just inferior to the gallbladder. No substantial surrounding stranding in this area. Correlate with any symptoms of colitis, this is favored to represent decompression versus mild secondary changes adjacent of the colon in the setting of previous inflammatory change. Could consider short interval follow-up or Cologuard correlation. Could also consider direct visualization as warranted for further assessment as a more aggressive approach. 4. Stable cystic lesion in the body-tail of the pancreas. This may represent a small pseudocyst or indolent cystic neoplasm. Attention on follow-up. 5. Aortic atherosclerosis. 6. Fat containing LEFT inguinal and umbilical hernias are small. 7. Signs of aortic valve and mitral valve calcification. 8. Colonic diverticulosis.   12/31/2022 Imaging   CT Abdomen and Pelvis with Contrast  IMPRESSION: 1. Unchanged mild soft tissue stranding about the porta hepatis. No evidence of progressive disease in the abdomen or pelvis. 2. Similar intrahepatic biliary ductal dilation and pneumobilia with common bile duct stent in place. 3. Unchanged cystic lesion of the tail of the pancreas measuring 13 mm. Recommend attention on follow-up. 4. Mild aortic Atherosclerosis (ICD10-I70.0).       Discussed the use of AI scribe software for clinical note transcription with the patient, who gave verbal consent to proceed.  History of Present Illness  The patient, an 82 year old with a history of cholangiocarcinoma, presents for a follow-up visit. The patient's wife reports that the patient  has been experiencing fatigue and decreased energy levels. The patient also reports a decrease in appetite, leading to weight loss of about three to four pounds in the past month. The patient's daily food intake has significantly reduced, with the patient consuming only half of his usual meal portions. The patient's wife also reports that the patient has become picky with food, disliking most food items. The patient denies any changes in the taste of food and reports not feeling hungry most of the time. Despite the fatigue, the patient is still able to perform yard work, albeit at a slower pace. The patient occasionally takes naps during the day and denies any pain. However, the patient reports a 'weird' feeling in the stomach, which is not described as a stomach ache.         All other systems were reviewed with the patient and are negative.  MEDICAL HISTORY:  Past Medical History:  Diagnosis Date   Arthritis    Back pain    Heart murmur    History of kidney stones    ICH (intracerebral hemorrhage) (HCC)     SURGICAL HISTORY: Past Surgical History:  Procedure Laterality Date   BACK SURGERY     BILIARY BRUSHING  10/11/2021   Procedure: BILIARY BRUSHING;  Surgeon: Meryl Dare, MD;  Location: [facility];  Service: Gastroenterology;;   BILIARY BRUSHING  11/21/2021   Procedure: BILIARY BRUSHING;  Surgeon: Vida Rigger, MD;  Location: [facility];  Service: Gastroenterology;;   BILIARY STENT PLACEMENT N/A 10/11/2021   Procedure: BILIARY STENT PLACEMENT;  Surgeon: Meryl Dare, MD;  Location: [facility];  Service: Gastroenterology;  Laterality: N/A;   BILIARY STENT PLACEMENT N/A 11/21/2021   Procedure: BILIARY STENT PLACEMENT;  Surgeon: Vida Rigger, MD;  Location: [facility];  Service: Gastroenterology;  Laterality: N/A;   BIOPSY  11/21/2021   Procedure: BIOPSY;  Surgeon: Vida Rigger, MD;  Location: [facility];  Service: Gastroenterology;;   ERCP N/A 10/11/2021   Procedure:  ENDOSCOPIC RETROGRADE CHOLANGIOPANCREATOGRAPHY (ERCP);  Surgeon: Meryl Dare, MD;  Location: [facility];  Service: Gastroenterology;  Laterality: N/A;   ERCP N/A 11/21/2021   Procedure: ENDOSCOPIC RETROGRADE CHOLANGIOPANCREATOGRAPHY (ERCP);  Surgeon: Vida Rigger, MD;  Location: [facility];  Service: Gastroenterology;  Laterality: N/A;  with Spyglass   ESOPHAGOGASTRODUODENOSCOPY (EGD) WITH PROPOFOL N/A 10/11/2021   Procedure: ESOPHAGOGASTRODUODENOSCOPY (EGD) WITH PROPOFOL;  Surgeon: Willis Modena, MD;  Location: [facility];  Service: Gastroenterology;  Laterality: N/A;   EUS N/A 10/11/2021   Procedure: UPPER ENDOSCOPIC ULTRASOUND (EUS) LINEAR;  Surgeon: Willis Modena, MD;  Location: [facility];  Service: Gastroenterology;  Laterality: N/A;   FINE NEEDLE ASPIRATION N/A 10/11/2021   Procedure: FINE NEEDLE ASPIRATION (FNA) LINEAR;  Surgeon: Willis Modena, MD;  Location: [facility];  Service: Gastroenterology;  Laterality: N/A;   INGUINAL HERNIA REPAIR     IR EXCHANGE BILIARY DRAIN  01/22/2022   IR EXCHANGE BILIARY DRAIN  01/26/2022   IR EXCHANGE BILIARY DRAIN  03/05/2022   IR PERC CHOLECYSTOSTOMY  01/02/2022   KNEE ARTHROPLASTY Right 05/16/2017   Procedure: RIGHT TOTAL KNEE ARTHROPLASTY WITH COMPUTER NAVIGATION;  Surgeon: Samson Frederic, MD;  Location: [facility];  Service: Orthopedics;  Laterality: Right;  Needs RNFA   NECK SURGERY     SPHINCTEROTOMY  10/11/2021   Procedure: SPHINCTEROTOMY;  Surgeon: Meryl Dare, MD;  Location: [facility];  Service:  Gastroenterology;;   Dennison Mascot  11/21/2021   Procedure: Dennison Mascot;  Surgeon: Vida Rigger, MD;  Location: [facility];  Service: Gastroenterology;;   Burman Freestone CHOLANGIOSCOPY N/A 11/21/2021   Procedure: ZOXWRUEA CHOLANGIOSCOPY;  Surgeon: Vida Rigger, MD;  Location: [facility];  Service: Gastroenterology;  Laterality: N/A;   STENT REMOVAL  11/21/2021   Procedure: STENT REMOVAL;  Surgeon: Vida Rigger, MD;  Location: [facility];   Service: Gastroenterology;;   TONSILLECTOMY      I have reviewed the social history and family history with the patient and they are unchanged from previous note.  ALLERGIES:  has No Known Allergies.  MEDICATIONS:  Current Outpatient Medications  Medication Sig Dispense Refill   acetaminophen (TYLENOL) 500 MG tablet Take 1,000 mg by mouth daily as needed (pain).     amoxicillin (AMOXIL) 500 MG tablet Take 500 mg by mouth as needed (PRIOR TO DENTAL APPOINTMENT).     levothyroxine (SYNTHROID) 137 MCG tablet Take 137 mcg by mouth daily before breakfast.     No current facility-administered medications for this visit.    PHYSICAL EXAMINATION: ECOG PERFORMANCE STATUS: 1 - Symptomatic but completely ambulatory  Vitals:   01/29/23 1408  BP: 110/69  Pulse: 64  Resp: 16  Temp: 98.4 F (36.9 C)  SpO2: 98%   Wt Readings from Last 3 Encounters:  01/29/23 175 lb 9.6 oz (79.7 kg)  01/07/23 179 lb 3.2 oz (81.3 kg)  12/25/22 174 lb (78.9 kg)     GENERAL:alert, no distress and comfortable SKIN: skin color, texture, turgor are normal, no rashes or significant lesions EYES: normal, Conjunctiva are pink and non-injected, sclera clear NECK: supple, thyroid normal size, non-tender, without nodularity LYMPH:  no palpable lymphadenopathy in the cervical, axillary  LUNGS: clear to auscultation and percussion with normal breathing effort HEART: regular rate & rhythm and no murmurs and no lower extremity edema ABDOMEN:abdomen soft, non-tender and normal bowel sounds Musculoskeletal:no cyanosis of digits and no clubbing  NEURO: alert & oriented x 3 with fluent speech, no focal motor/sensory deficits  LABORATORY DATA:  I have reviewed the data as listed    Latest Ref Rng & Units 01/23/2023    1:54 PM 12/31/2022   10:35 AM 10/05/2022   10:07 AM  CBC  WBC 4.0 - 10.5 K/uL 5.0  3.9  3.9   Hemoglobin 13.0 - 17.0 g/dL 54.0  98.1  19.1   Hematocrit 39.0 - 52.0 % 36.6  38.1  37.6   Platelets 150 -  400 K/uL 268  203  194         Latest Ref Rng & Units 01/23/2023    1:54 PM 12/31/2022   10:35 AM 10/05/2022   10:07 AM  CMP  Glucose 70 - 99 mg/dL 478  89  295   BUN 8 - 23 mg/dL 9  16  11    Creatinine 0.61 - 1.24 mg/dL 6.21  3.08  6.57   Sodium 135 - 145 mmol/L 139  139  140   Potassium 3.5 - 5.1 mmol/L 4.1  4.1  4.2   Chloride 98 - 111 mmol/L 104  105  106   CO2 22 - 32 mmol/L 32  30  30   Calcium 8.9 - 10.3 mg/dL 8.9  9.3  9.4   Total Protein 6.5 - 8.1 g/dL 7.1  7.0  6.8   Total Bilirubin 0.3 - 1.2 mg/dL 1.0  0.9  0.8   Alkaline Phos 38 - 126 U/L 319  109  111  AST 15 - 41 U/L 59  32  29   ALT 0 - 44 U/L 82  34  19       RADIOGRAPHIC STUDIES: I have personally reviewed the radiological images as listed and agreed with the findings in the report. No results found.    Orders Placed This Encounter  Procedures   NM PET Image Initial (PI) Skull Base To Thigh    Standing Status:   Future    Standing Expiration Date:   01/29/2024    Order Specific Question:   If indicated for the ordered procedure, I authorize the administration of a radiopharmaceutical per Radiology protocol    Answer:   Yes    Order Specific Question:   Preferred imaging location?    Answer:   [facility]   All questions were answered. The patient knows to call the clinic with any problems, questions or concerns. No barriers to learning was detected. The total time spent in the appointment was 30 minutes.     Malachy Mood, MD 01/29/2023

## 2023-01-30 ENCOUNTER — Telehealth: Payer: Self-pay | Admitting: Hematology

## 2023-02-06 DIAGNOSIS — R7989 Other specified abnormal findings of blood chemistry: Secondary | ICD-10-CM | POA: Diagnosis not present

## 2023-02-06 DIAGNOSIS — K838 Other specified diseases of biliary tract: Secondary | ICD-10-CM | POA: Diagnosis not present

## 2023-02-12 ENCOUNTER — Ambulatory Visit (HOSPITAL_COMMUNITY)
Admission: RE | Admit: 2023-02-12 | Discharge: 2023-02-12 | Disposition: A | Discharge status: Home / Self Care | Payer: Medicare Other | Source: Ambulatory Visit | Attending: Hematology | Admitting: Hematology

## 2023-02-12 ENCOUNTER — Inpatient Hospital Stay: Payer: Medicare Other

## 2023-02-12 DIAGNOSIS — C221 Intrahepatic bile duct carcinoma: Secondary | ICD-10-CM

## 2023-02-12 LAB — COMPREHENSIVE METABOLIC PANEL
ALT: 39 U/L (ref 0–44)
AST: 38 U/L (ref 15–41)
Albumin: 3.7 g/dL (ref 3.5–5.0)
Alkaline Phosphatase: 210 U/L — ABNORMAL HIGH (ref 38–126)
Anion gap: 5 (ref 5–15)
BUN: 13 mg/dL (ref 8–23)
CO2: 31 mmol/L (ref 22–32)
Calcium: 9.6 mg/dL (ref 8.9–10.3)
Chloride: 103 mmol/L (ref 98–111)
Creatinine, Ser: 1.11 mg/dL (ref 0.61–1.24)
GFR, Estimated: >60 mL/min (ref >60)
Glucose, Bld: 83 mg/dL (ref 70–99)
Potassium: 4.2 mmol/L (ref 3.5–5.1)
Sodium: 139 mmol/L (ref 135–145)
Total Bilirubin: 0.9 mg/dL (ref 0.3–1.2)
Total Protein: 7.6 g/dL (ref 6.5–8.1)

## 2023-02-12 LAB — CBC WITH DIFFERENTIAL/PLATELET
Abs Immature Granulocytes: 0.01 10*3/uL (ref 0.00–0.07)
Basophils Absolute: 0 10*3/uL (ref 0.0–0.1)
Basophils Relative: 0 %
Eosinophils Absolute: 0.1 10*3/uL (ref 0.0–0.5)
Eosinophils Relative: 3 %
HCT: 39.7 % (ref 39.0–52.0)
Hemoglobin: 13.2 g/dL (ref 13.0–17.0)
Immature Granulocytes: 0 %
Lymphocytes Relative: 15 %
Lymphs Abs: 0.7 10*3/uL (ref 0.7–4.0)
MCH: 32.9 pg (ref 26.0–34.0)
MCHC: 33.2 g/dL (ref 30.0–36.0)
MCV: 99 fL (ref 80.0–100.0)
Monocytes Absolute: 0.8 10*3/uL (ref 0.1–1.0)
Monocytes Relative: 17 %
Neutro Abs: 3 10*3/uL (ref 1.7–7.7)
Neutrophils Relative %: 65 %
Platelets: 231 10*3/uL (ref 150–400)
RBC: 4.01 MIL/uL — ABNORMAL LOW (ref 4.22–5.81)
RDW: 12.7 % (ref 11.5–15.5)
WBC: 4.7 10*3/uL (ref 4.0–10.5)
nRBC: 0 % (ref 0.0–0.2)

## 2023-02-12 LAB — GLUCOSE, CAPILLARY: Glucose-Capillary: 109 mg/dL — ABNORMAL HIGH (ref 70–99)

## 2023-02-12 MED ORDER — FLUDEOXYGLUCOSE F - 18 (FDG) INJECTION
8.6000 | Freq: Once | INTRAVENOUS | Status: AC | PRN
  Administered 2023-02-12: 8.67 via INTRAVENOUS

## 2023-02-13 LAB — CANCER ANTIGEN 19-9: CA 19-9: 331 U/mL — ABNORMAL HIGH (ref 0–35)

## 2023-02-14 ENCOUNTER — Other Ambulatory Visit: Payer: Self-pay | Admitting: Gastroenterology

## 2023-02-18 ENCOUNTER — Inpatient Hospital Stay (HOSPITAL_BASED_OUTPATIENT_CLINIC_OR_DEPARTMENT_OTHER): Payer: Medicare Other | Admitting: Hematology

## 2023-02-18 ENCOUNTER — Other Ambulatory Visit: Payer: Self-pay

## 2023-02-18 ENCOUNTER — Encounter (HOSPITAL_COMMUNITY): Payer: Self-pay | Admitting: Gastroenterology

## 2023-02-18 ENCOUNTER — Encounter: Payer: Self-pay | Admitting: Hematology

## 2023-02-18 DIAGNOSIS — C221 Intrahepatic bile duct carcinoma: Secondary | ICD-10-CM | POA: Diagnosis not present

## 2023-02-18 NOTE — Assessment & Plan Note (Signed)
cTxN0M0 -presented with elevated LFT's, jaundice, 10-15 lb weight loss. CT AP 10/06/21 showed biliary obstruction, he was referred to ED. -baseline CA 19-9 on 10/06/21 elevated to 129. -s/p ERCP 10/11/21 inpatient showed a mass in common bile duct, cytology not diagnostic showing only suspicious atypical cells. S/p stent placement  -staging chest CT 11/03/21 was negative. -he met Dr. Freida Busman on 11/08/21 and was offered surgery, but pt declined. -repeat ERCP on 11/21/21 for stent change, path and cytology were again non-diagnostic but suspicious. -he received radiation 12/04/21 - 01/16/22. He tried concurrent Xeloda for a week but tolerated poorly with nausea, weight loss, and rash to his hands and face, discontinued 8/22.  -He developed cholecystitis during treatment which was managed with percutaneous cholecystostomy  -restaging CT 07/02/2022 showed Decompression of the gallbladder with resolution of adjacent fluid collection since previous imaging, no evidence of cancer progression  -restaging CT 9/9 showed stable soft tissue in porta hepatis, no evidence of cancer progression.  His tumor marker CA 19.9 has been rising lately -CT chest 01/23/2023 was negative

## 2023-02-18 NOTE — Progress Notes (Signed)
[facility]   Telephone:(336) [phone] Fax:(336) [phone]   Clinic Follow up Note   Patient Care Team: Farris Has, MD as PCP - General (Family Medicine) Jens Som Madolyn Frieze, MD as PCP - Cardiology (Cardiology) Jens Som Madolyn Frieze, MD as Consulting Physician (Cardiology) Malachy Mood, MD as Consulting Physician (Hematology and Oncology)  Date of Service:  02/18/2023  I connected with Patrick Collier on 02/18/23 at  1:00 PM EDT by telephone and verified that I am speaking with the correct person using two identifiers.   I discussed the limitations, risks, security and privacy concerns of performing an evaluation and management service by telephone and the availability of in person appointments. I also discussed with the patient that there may be a patient responsible charge related to this service. The patient expressed understanding and agreed to proceed.   Patient's location:  home Provider's location:  office   CHIEF COMPLAINT: f/u of cholangiocarcinoma  CURRENT THERAPY:  Surveillance  Oncology History   Cholangiocarcinoma (HCC) cTxN0M0 -presented with elevated LFT's, jaundice, 10-15 lb weight loss. CT AP 10/06/21 showed biliary obstruction, he was referred to ED. -baseline CA 19-9 on 10/06/21 elevated to 129. -s/p ERCP 10/11/21 inpatient showed a mass in common bile duct, cytology not diagnostic showing only suspicious atypical cells. S/p stent placement  -staging chest CT 11/03/21 was negative. -he met Dr. Freida Busman on 11/08/21 and was offered surgery, but pt declined. -repeat ERCP on 11/21/21 for stent change, path and cytology were again non-diagnostic but suspicious. -he received radiation 12/04/21 - 01/16/22. He tried concurrent Xeloda for a week but tolerated poorly with nausea, weight loss, and rash to his hands and face, discontinued 8/22.  -He developed cholecystitis during treatment which was managed with percutaneous cholecystostomy  -restaging CT 07/02/2022 showed  Decompression of the gallbladder with resolution of adjacent fluid collection since previous imaging, no evidence of cancer progression  -restaging CT 9/9 showed stable soft tissue in porta hepatis, no evidence of cancer progression.  His tumor marker CA 19.9 has been rising lately -CT chest 01/23/2023 was negative      Assessment and Plan    Cholangiocarcinoma Rising tumor marker and PET scan positive at the bile duct where previous cancer was located. No signs of metastasis.  I personally reviewed the PET scan images and discussed the findings with patient and his wife.  Patient has had previous radiation therapy and is not open to surgery. Chemotherapy was not well tolerated previously. -Patient again declined Whipple surgery, which was previously offered by Dr. Freida Busman last year.  Patient understands that the only way to potentially cure his cancer. -Endoscopy scheduled for 02/27/2023 to examine the stent and possibly perform cytology. -Check with pathology to see if previous biopsy has enough cells for molecular testing. -If not enough cells, consider liquid biopsy [id number]. -Follow-up in 3-4 weeks to discuss test results and potential treatment options.  Plan -Patient is scheduled for ERCP next Wednesday by Dr. Ewing Schlein, I messaged Dr. Ewing Schlein to get more tissue biopsy -If we have enough tumor tissue, will order [facility], if not, we will get a liquid biopsy [id number] -lab and f/u in 3-4 weeks         SUMMARY OF ONCOLOGIC HISTORY: Oncology History Overview Note   Cancer Staging  Cholangiocarcinoma [facility]) Staging form: Distal Bile Duct, AJCC 8th Edition - Clinical stage from 11/21/2021: Stage Unknown (cTX, cN0, cM0) - Signed by Malachy Mood, MD on 12/12/2021 Total positive nodes: 0     Cholangiocarcinoma (HCC)  10/07/2021  Imaging   MR ABDOMEN MRCP W WO CONTAST  IMPRESSION: 1. Severe biliary ductal dilation of both the intra and extrahepatic bile ducts with abrupt cutoff seen at the  distal common bile duct. Findings are concerning for stricture or obstructing tumor. No obstructing mass is seen, although motion artifact on contrast-enhanced imaging limits evaluation. Recommend correlation with ERCP. 2. Small cystic lesions of the pancreatic tail, largest measures 10 mm. Recommend follow up pre and post contrast MRI/MRCP or pancreatic protocol CT in 2 years. This recommendation follows ACR consensus guidelines: Management of Incidental Pancreatic Cysts: A White Paper of the ACR Incidental Findings Committee. J Am Coll Radiol 2017;14:911-923. 3. Trace perihepatic ascites and trace bilateral pleural effusions.   10/11/2021 Procedure   ERCP-Dr. Russella Dar  Impression:  -I single localized malignant appearing severe biliary stricture was found in the lower third of the main bile duct. -The proximal biliary tree was dilated, secondary to stricture -Biliary sphincterotomy was performed -Cells for cytology obtained in the lower third of the main duct -One plastic stent was placed into the common bile duct.     10/11/2021 Pathology Results   CYTOLOGY - NON PAP  CASE: [id number]  PATIENT: Patrick Collier  Non-Gynecological Cytology Report   FINAL MICROSCOPIC DIAGNOSIS:  A. COMMON BILE DUCT, LESION, FINE NEEDLE ASPIRATION:  - No malignant cells seen  -Scant cellularity; benign/reactive ductal cells and ductal epithelium  with acute inflammatory cells   B. COMMON BILE DUCT, STRICTURE, BRUSHING:  - Atypical cells suspicious for tumor present    10/26/2021 Initial Diagnosis   Cholangiocarcinoma (HCC)    Procedure   Upper EUS-Dr. Dulce Sellar  Impression:  - There was no sign of significant pathology in the ampulla. - A mass was found in the common bile duct. Fine needle aspiration performed. - There was no sign of significant pathology in the pancreatic head, genu of the pancreas and pancreatic body. - A few abnormal lymph nodes were visualized in the peripancreatic  region and porta hepatis region. - There was dilation in the middle third of the main bile duct, in the upper third of the main bile duct and in the common hepatic duct which measured up to 15 mm. - Small perihepatic ascites. - Overall constellation findings most consistent mid common bile duct cholangiocarcinoma.   11/21/2021 Cancer Staging   Staging form: Distal Bile Duct, AJCC 8th Edition - Clinical stage from 11/21/2021: Stage Unknown (cTX, cN0, cM0) - Signed by Malachy Mood, MD on 12/12/2021 Total positive nodes: 0   07/02/2022 Imaging    IMPRESSION: 1. Decompression of the gallbladder with resolution of adjacent fluid collection since previous imaging. 2. Mild stranding about the porta hepatis and stranding/soft tissue density surrounding replaced RIGHT hepatic artery which arises from the SMA. 3. Under distension versus thickening of the hepatic flexure of the colon adjacent to the hepatic duodenal ligament just inferior to the gallbladder. No substantial surrounding stranding in this area. Correlate with any symptoms of colitis, this is favored to represent decompression versus mild secondary changes adjacent of the colon in the setting of previous inflammatory change. Could consider short interval follow-up or Cologuard correlation. Could also consider direct visualization as warranted for further assessment as a more aggressive approach. 4. Stable cystic lesion in the body-tail of the pancreas. This may represent a small pseudocyst or indolent cystic neoplasm. Attention on follow-up. 5. Aortic atherosclerosis. 6. Fat containing LEFT inguinal and umbilical hernias are small. 7. Signs of aortic valve and mitral valve calcification. 8. Colonic diverticulosis.  12/31/2022 Imaging   CT Abdomen and Pelvis with Contrast  IMPRESSION: 1. Unchanged mild soft tissue stranding about the porta hepatis. No evidence of progressive disease in the abdomen or pelvis. 2. Similar intrahepatic  biliary ductal dilation and pneumobilia with common bile duct stent in place. 3. Unchanged cystic lesion of the tail of the pancreas measuring 13 mm. Recommend attention on follow-up. 4. Mild aortic Atherosclerosis (ICD10-I70.0).       Discussed the use of AI scribe software for clinical note transcription with the patient, who gave verbal consent to proceed.  History of Present Illness   An 82 year old patient with a history of cholangiocarcinoma presents for a phone consultation to discuss recent PET scan results and elevated tumor markers. The patient's tumor marker levels have been progressively increasing, with the most recent reading at 332. The patient's blood counts, kidney, and liver functions are within normal limits, with a slight elevation in alkaline phosphate. This suggests that the radiation treatment did not eliminate all the cancer cells and the cancer is likely growing back. The patient reports feeling good overall, although he gets tired and his appetite is not as strong as it used to be. There is no significant weight loss reported.         All other systems were reviewed with the patient and are negative.  MEDICAL HISTORY:  Past Medical History:  Diagnosis Date   Arthritis    Back pain    Cancer (HCC)    Dementia (HCC)    Heart murmur    History of kidney stones    Hypothyroidism    ICH (intracerebral hemorrhage) (HCC)     SURGICAL HISTORY: Past Surgical History:  Procedure Laterality Date   BACK SURGERY     BILIARY BRUSHING  10/11/2021   Procedure: BILIARY BRUSHING;  Surgeon: Meryl Dare, MD;  Location: [facility];  Service: Gastroenterology;;   BILIARY BRUSHING  11/21/2021   Procedure: BILIARY BRUSHING;  Surgeon: Vida Rigger, MD;  Location: [facility];  Service: Gastroenterology;;   BILIARY STENT PLACEMENT N/A 10/11/2021   Procedure: BILIARY STENT PLACEMENT;  Surgeon: Meryl Dare, MD;  Location: [facility];  Service: Gastroenterology;   Laterality: N/A;   BILIARY STENT PLACEMENT N/A 11/21/2021   Procedure: BILIARY STENT PLACEMENT;  Surgeon: Vida Rigger, MD;  Location: [facility];  Service: Gastroenterology;  Laterality: N/A;   BIOPSY  11/21/2021   Procedure: BIOPSY;  Surgeon: Vida Rigger, MD;  Location: [facility];  Service: Gastroenterology;;   ERCP N/A 10/11/2021   Procedure: ENDOSCOPIC RETROGRADE CHOLANGIOPANCREATOGRAPHY (ERCP);  Surgeon: Meryl Dare, MD;  Location: [facility];  Service: Gastroenterology;  Laterality: N/A;   ERCP N/A 11/21/2021   Procedure: ENDOSCOPIC RETROGRADE CHOLANGIOPANCREATOGRAPHY (ERCP);  Surgeon: Vida Rigger, MD;  Location: [facility];  Service: Gastroenterology;  Laterality: N/A;  with Spyglass   ESOPHAGOGASTRODUODENOSCOPY (EGD) WITH PROPOFOL N/A 10/11/2021   Procedure: ESOPHAGOGASTRODUODENOSCOPY (EGD) WITH PROPOFOL;  Surgeon: Willis Modena, MD;  Location: [facility];  Service: Gastroenterology;  Laterality: N/A;   EUS N/A 10/11/2021   Procedure: UPPER ENDOSCOPIC ULTRASOUND (EUS) LINEAR;  Surgeon: Willis Modena, MD;  Location: [facility];  Service: Gastroenterology;  Laterality: N/A;   FINE NEEDLE ASPIRATION N/A 10/11/2021   Procedure: FINE NEEDLE ASPIRATION (FNA) LINEAR;  Surgeon: Willis Modena, MD;  Location: [facility];  Service: Gastroenterology;  Laterality: N/A;   INGUINAL HERNIA REPAIR     IR EXCHANGE BILIARY DRAIN  01/22/2022   IR EXCHANGE BILIARY DRAIN  01/26/2022   IR EXCHANGE BILIARY DRAIN  03/05/2022   IR PERC CHOLECYSTOSTOMY  01/02/2022   KNEE ARTHROPLASTY Right 05/16/2017   Procedure: RIGHT TOTAL KNEE ARTHROPLASTY WITH COMPUTER NAVIGATION;  Surgeon: Samson Frederic, MD;  Location: [facility];  Service: Orthopedics;  Laterality: Right;  Needs RNFA   NECK SURGERY     SPHINCTEROTOMY  10/11/2021   Procedure: SPHINCTEROTOMY;  Surgeon: Meryl Dare, MD;  Location: [facility];  Service: Gastroenterology;;   Dennison Mascot  11/21/2021   Procedure: Dennison Mascot;  Surgeon:  Vida Rigger, MD;  Location: [facility];  Service: Gastroenterology;;   Burman Freestone CHOLANGIOSCOPY N/A 11/21/2021   Procedure: ZOXWRUEA CHOLANGIOSCOPY;  Surgeon: Vida Rigger, MD;  Location: [facility];  Service: Gastroenterology;  Laterality: N/A;   STENT REMOVAL  11/21/2021   Procedure: STENT REMOVAL;  Surgeon: Vida Rigger, MD;  Location: [facility];  Service: Gastroenterology;;   TONSILLECTOMY      I have reviewed the social history and family history with the patient and they are unchanged from previous note.  ALLERGIES:  has No Known Allergies.  MEDICATIONS:  Current Outpatient Medications  Medication Sig Dispense Refill   acetaminophen (TYLENOL) 500 MG tablet Take 1,000 mg by mouth daily as needed (pain).     amoxicillin (AMOXIL) 500 MG tablet Take 500 mg by mouth as needed (PRIOR TO DENTAL APPOINTMENT).     levothyroxine (SYNTHROID) 137 MCG tablet Take 137 mcg by mouth daily before breakfast.     No current facility-administered medications for this visit.    PHYSICAL EXAMINATION: ECOG PERFORMANCE STATUS: 0 - Asymptomatic  There were no vitals filed for this visit. Wt Readings from Last 3 Encounters:  01/29/23 175 lb 9.6 oz (79.7 kg)  01/07/23 179 lb 3.2 oz (81.3 kg)  12/25/22 174 lb (78.9 kg)       LABORATORY DATA:  I have reviewed the data as listed    Latest Ref Rng & Units 02/12/2023   11:47 AM 01/23/2023    1:54 PM 12/31/2022   10:35 AM  CBC  WBC 4.0 - 10.5 K/uL 4.7  5.0  3.9   Hemoglobin 13.0 - 17.0 g/dL 54.0  98.1  19.1   Hematocrit 39.0 - 52.0 % 39.7  36.6  38.1   Platelets 150 - 400 K/uL 231  268  203         Latest Ref Rng & Units 02/12/2023   11:47 AM 01/23/2023    1:54 PM 12/31/2022   10:35 AM  CMP  Glucose 70 - 99 mg/dL 83  478  89   BUN 8 - 23 mg/dL 13  9  16    Creatinine 0.61 - 1.24 mg/dL 2.95  6.21  3.08   Sodium 135 - 145 mmol/L 139  139  139   Potassium 3.5 - 5.1 mmol/L 4.2  4.1  4.1   Chloride 98 - 111 mmol/L 103  104  105   CO2 22 - 32  mmol/L 31  32  30   Calcium 8.9 - 10.3 mg/dL 9.6  8.9  9.3   Total Protein 6.5 - 8.1 g/dL 7.6  7.1  7.0   Total Bilirubin 0.3 - 1.2 mg/dL 0.9  1.0  0.9   Alkaline Phos 38 - 126 U/L 210  319  109   AST 15 - 41 U/L 38  59  32   ALT 0 - 44 U/L 39  82  34       RADIOGRAPHIC STUDIES: I have personally reviewed the radiological images as listed and agreed with the findings in  the report. No results found.    No orders of the defined types were placed in this encounter.   I discussed the assessment and treatment plan with the patient. The patient was provided an opportunity to ask questions and all were answered. The patient agreed with the plan and demonstrated an understanding of the instructions.   The patient was advised to call back or seek an in-person evaluation if the symptoms worsen or if the condition fails to improve as anticipated.  I provided 21 minutes of non face-to-face telephone visit time during this encounter, and > 50% was spent counseling as documented under my assessment & plan.      Malachy Mood, MD 02/18/2023

## 2023-02-19 ENCOUNTER — Telehealth: Payer: Self-pay

## 2023-02-19 ENCOUNTER — Other Ambulatory Visit: Payer: Self-pay

## 2023-02-19 DIAGNOSIS — C221 Intrahepatic bile duct carcinoma: Secondary | ICD-10-CM

## 2023-02-19 NOTE — Progress Notes (Signed)
Tried contacting both [facility] and [facility] Pathology Depts via telephone.  Telephone system for both locations are down; therefore, sent email message to [facility], [facility] [place], and [employer] regarding Case# [id number].  Awaiting a response to email regarding the case.

## 2023-02-19 NOTE — Progress Notes (Signed)
[facility] ordered on case # [id number] pt scheduled for liquid biopsy on 02/22/2023 @11 :15am

## 2023-02-19 NOTE — Telephone Encounter (Signed)
LVM requesting pt to give Dr. Latanya Maudlin office a call so he can be scheduled for a lab appt for [facility].  Provided pt with callback number.  Awaiting pt's call.

## 2023-02-22 ENCOUNTER — Inpatient Hospital Stay: Payer: Medicare Other | Attending: Physician Assistant

## 2023-02-22 DIAGNOSIS — C221 Intrahepatic bile duct carcinoma: Secondary | ICD-10-CM

## 2023-02-22 LAB — MISCELLANEOUS TEST

## 2023-02-25 ENCOUNTER — Other Ambulatory Visit: Payer: Self-pay

## 2023-02-25 NOTE — Progress Notes (Signed)
Email received from [facility] stating xT & xR tissue sample is insuffient for Case#[id number].  Dr. Mosetta Putt requested if only liquid biopsy be done (xF+).  Order placed and [facility] notified.

## 2023-02-26 NOTE — Anesthesia Preprocedure Evaluation (Signed)
Anesthesia Evaluation  Patient identified by MRN, date of birth, ID band Patient awake    Reviewed: Allergy & Precautions, NPO status , Patient's Chart, lab work & pertinent test results  Airway Mallampati: II  TM Distance: >3 FB Neck ROM: Full    Dental no notable dental hx. (+) Teeth Intact, Dental Advisory Given   Pulmonary former smoker   Pulmonary exam normal breath sounds clear to auscultation       Cardiovascular Exercise Tolerance: Good Normal cardiovascular exam+ Valvular Problems/Murmurs AS  Rhythm:Regular Rate:Normal     Neuro/Psych       Dementia negative neurological ROS  negative psych ROS   GI/Hepatic negative GI ROS, Neg liver ROS,,,  Endo/Other  Hypothyroidism    Renal/GU Renal InsufficiencyRenal diseaseLab Results      Component                Value               Date                       K                        4.2                 02/12/2023                    CREATININE               1.11                02/12/2023                 Musculoskeletal  (+) Arthritis ,    Abdominal   Peds  Hematology Lab Results      Component                Value               Date                      WBC                      4.7                 02/12/2023                HGB                      13.2                02/12/2023                HCT                      39.7                02/12/2023                MCV                      99.0                02/12/2023                PLT  231                 02/12/2023              Anesthesia Other Findings   Reproductive/Obstetrics                              Anesthesia Physical Anesthesia Plan  ASA: 3  Anesthesia Plan: General   Post-op Pain Management: Minimal or no pain anticipated   Induction: Intravenous  PONV Risk Score and Plan: 2 and Treatment may vary due to age or medical condition and  Ondansetron  Airway Management Planned: Oral ETT  Additional Equipment: None  Intra-op Plan:   Post-operative Plan: Extubation in OR  Informed Consent: I have reviewed the patients History and Physical, chart, labs and discussed the procedure including the risks, benefits and alternatives for the proposed anesthesia with the patient or authorized representative who has indicated his/her understanding and acceptance.     Dental advisory given  Plan Discussed with: CRNA  Anesthesia Plan Comments: (Bile duct abnormality)         Anesthesia Quick Evaluation

## 2023-02-27 ENCOUNTER — Encounter (HOSPITAL_COMMUNITY)
Admission: RE | Disposition: A | Discharge status: Home / Self Care | Payer: Self-pay | Source: Home / Self Care | Attending: Gastroenterology

## 2023-02-27 ENCOUNTER — Encounter (HOSPITAL_COMMUNITY): Payer: Self-pay | Admitting: Gastroenterology

## 2023-02-27 ENCOUNTER — Other Ambulatory Visit: Payer: Self-pay

## 2023-02-27 ENCOUNTER — Ambulatory Visit (HOSPITAL_COMMUNITY): Payer: Self-pay | Admitting: Anesthesiology

## 2023-02-27 ENCOUNTER — Ambulatory Visit (HOSPITAL_BASED_OUTPATIENT_CLINIC_OR_DEPARTMENT_OTHER): Payer: Medicare Other | Admitting: Anesthesiology

## 2023-02-27 ENCOUNTER — Ambulatory Visit (HOSPITAL_COMMUNITY)
Admission: RE | Admit: 2023-02-27 | Discharge: 2023-02-27 | Disposition: A | Discharge status: Home / Self Care | Payer: Medicare Other | Attending: Gastroenterology | Admitting: Gastroenterology

## 2023-02-27 ENCOUNTER — Ambulatory Visit (HOSPITAL_COMMUNITY): Payer: Medicare Other

## 2023-02-27 ENCOUNTER — Other Ambulatory Visit: Payer: Self-pay | Admitting: Gastroenterology

## 2023-02-27 DIAGNOSIS — C240 Malignant neoplasm of extrahepatic bile duct: Secondary | ICD-10-CM | POA: Diagnosis not present

## 2023-02-27 DIAGNOSIS — R748 Abnormal levels of other serum enzymes: Secondary | ICD-10-CM | POA: Diagnosis not present

## 2023-02-27 DIAGNOSIS — C221 Intrahepatic bile duct carcinoma: Secondary | ICD-10-CM | POA: Diagnosis not present

## 2023-02-27 DIAGNOSIS — T85590A Other mechanical complication of bile duct prosthesis, initial encounter: Secondary | ICD-10-CM | POA: Insufficient documentation

## 2023-02-27 DIAGNOSIS — Z87891 Personal history of nicotine dependence: Secondary | ICD-10-CM | POA: Diagnosis not present

## 2023-02-27 DIAGNOSIS — Z8509 Personal history of malignant neoplasm of other digestive organs: Secondary | ICD-10-CM | POA: Insufficient documentation

## 2023-02-27 DIAGNOSIS — R8569 Abnormal cytological findings in specimens from other digestive organs and abdominal cavity: Secondary | ICD-10-CM | POA: Diagnosis not present

## 2023-02-27 DIAGNOSIS — F0390 Unspecified dementia without behavioral disturbance: Secondary | ICD-10-CM | POA: Diagnosis not present

## 2023-02-27 DIAGNOSIS — R935 Abnormal findings on diagnostic imaging of other abdominal regions, including retroperitoneum: Secondary | ICD-10-CM | POA: Diagnosis not present

## 2023-02-27 DIAGNOSIS — Y828 Other medical devices associated with adverse incidents: Secondary | ICD-10-CM | POA: Diagnosis not present

## 2023-02-27 DIAGNOSIS — K831 Obstruction of bile duct: Secondary | ICD-10-CM | POA: Insufficient documentation

## 2023-02-27 DIAGNOSIS — R948 Abnormal results of function studies of other organs and systems: Secondary | ICD-10-CM | POA: Diagnosis not present

## 2023-02-27 DIAGNOSIS — E039 Hypothyroidism, unspecified: Secondary | ICD-10-CM | POA: Insufficient documentation

## 2023-02-27 DIAGNOSIS — K838 Other specified diseases of biliary tract: Secondary | ICD-10-CM | POA: Diagnosis not present

## 2023-02-27 HISTORY — PX: BILIARY BRUSHING: SHX6843

## 2023-02-27 HISTORY — DX: Hypothyroidism, unspecified: E03.9

## 2023-02-27 HISTORY — DX: Malignant (primary) neoplasm, unspecified: C80.1

## 2023-02-27 HISTORY — PX: REMOVAL OF STONES: SHX5545

## 2023-02-27 HISTORY — PX: BIOPSY: SHX5522

## 2023-02-27 HISTORY — PX: ERCP: SHX5425

## 2023-02-27 HISTORY — DX: Unspecified dementia, unspecified severity, without behavioral disturbance, psychotic disturbance, mood disturbance, and anxiety: F03.90

## 2023-02-27 HISTORY — PX: BILIARY STENT PLACEMENT: SHX5538

## 2023-02-27 SURGERY — ERCP, WITH INTERVENTION IF INDICATED
Anesthesia: General

## 2023-02-27 MED ORDER — GLUCAGON HCL RDNA (DIAGNOSTIC) 1 MG IJ SOLR
INTRAMUSCULAR | Status: AC
  Filled 2023-02-27: qty 1

## 2023-02-27 MED ORDER — PROPOFOL 1000 MG/100ML IV EMUL
INTRAVENOUS | Status: AC
  Filled 2023-02-27: qty 100

## 2023-02-27 MED ORDER — CIPROFLOXACIN IN D5W 400 MG/200ML IV SOLN
INTRAVENOUS | Status: AC
  Filled 2023-02-27: qty 200

## 2023-02-27 MED ORDER — DICLOFENAC SUPPOSITORY 100 MG
RECTAL | Status: AC
  Filled 2023-02-27: qty 1

## 2023-02-27 MED ORDER — SUGAMMADEX SODIUM 200 MG/2ML IV SOLN
INTRAVENOUS | Status: DC | PRN
  Administered 2023-02-27 (×2): 200 mg via INTRAVENOUS

## 2023-02-27 MED ORDER — DICLOFENAC SUPPOSITORY 100 MG
RECTAL | Status: DC | PRN
  Administered 2023-02-27: 100 mg via RECTAL

## 2023-02-27 MED ORDER — FENTANYL CITRATE (PF) 100 MCG/2ML IJ SOLN
INTRAMUSCULAR | Status: DC | PRN
  Administered 2023-02-27 (×2): 50 ug via INTRAVENOUS

## 2023-02-27 MED ORDER — SODIUM CHLORIDE 0.9 % IV SOLN
INTRAVENOUS | Status: DC | PRN
  Administered 2023-02-27: 40 mL

## 2023-02-27 MED ORDER — SODIUM CHLORIDE 0.9 % IV SOLN: INTRAVENOUS | Status: DC

## 2023-02-27 MED ORDER — ROCURONIUM BROMIDE 100 MG/10ML IV SOLN: INTRAVENOUS | Status: DC | PRN

## 2023-02-27 MED ORDER — ROCURONIUM BROMIDE 100 MG/10ML IV SOLN
INTRAVENOUS | Status: DC | PRN
  Administered 2023-02-27: 20 mg via INTRAVENOUS
  Administered 2023-02-27: 50 mg via INTRAVENOUS

## 2023-02-27 MED ORDER — CIPROFLOXACIN IN D5W 400 MG/200ML IV SOLN
INTRAVENOUS | Status: DC | PRN
  Administered 2023-02-27: 400 mg via INTRAVENOUS

## 2023-02-27 MED ORDER — ONDANSETRON HCL 4 MG/2ML IJ SOLN
INTRAMUSCULAR | Status: DC | PRN
  Administered 2023-02-27: 4 mg via INTRAVENOUS

## 2023-02-27 MED ORDER — LIDOCAINE HCL (CARDIAC) PF 100 MG/5ML IV SOSY
PREFILLED_SYRINGE | INTRAVENOUS | Status: DC | PRN
  Administered 2023-02-27: 80 mg via INTRAVENOUS

## 2023-02-27 MED ORDER — MIDAZOLAM HCL 5 MG/5ML IJ SOLN
INTRAMUSCULAR | Status: DC | PRN
  Administered 2023-02-27: 2 mg via INTRAVENOUS

## 2023-02-27 MED ORDER — PROPOFOL 10 MG/ML IV BOLUS
INTRAVENOUS | Status: DC | PRN
  Administered 2023-02-27 (×2): 90 mg via INTRAVENOUS

## 2023-02-27 MED ORDER — FENTANYL CITRATE (PF) 100 MCG/2ML IJ SOLN
INTRAMUSCULAR | Status: AC
  Filled 2023-02-27: qty 2

## 2023-02-27 MED ORDER — MIDAZOLAM HCL 2 MG/2ML IJ SOLN
INTRAMUSCULAR | Status: AC
  Filled 2023-02-27: qty 2

## 2023-02-27 MED ORDER — DEXAMETHASONE SODIUM PHOSPHATE 4 MG/ML IJ SOLN
INTRAMUSCULAR | Status: DC | PRN
  Administered 2023-02-27: 8 mg via INTRAVENOUS

## 2023-02-27 MED ORDER — PHENYLEPHRINE HCL (PRESSORS) 10 MG/ML IV SOLN
INTRAVENOUS | Status: DC | PRN
  Administered 2023-02-27 (×2): 160 ug via INTRAVENOUS

## 2023-02-27 NOTE — Anesthesia Postprocedure Evaluation (Signed)
Anesthesia Post Note  Patient: Patrick Collier  Procedure(s) Performed: ENDOSCOPIC RETROGRADE CHOLANGIOPANCREATOGRAPHY (ERCP) (Bilateral) BIOPSY BILIARY BRUSHING REMOVAL OF SLUDGE BILIARY STENT PLACEMENT     Patient location during evaluation: Endoscopy Anesthesia Type: General Level of consciousness: awake and alert Pain management: pain level controlled Vital Signs Assessment: post-procedure vital signs reviewed and stable Respiratory status: spontaneous breathing, nonlabored ventilation, respiratory function stable and patient connected to nasal cannula oxygen Cardiovascular status: blood pressure returned to baseline and stable Postop Assessment: no apparent nausea or vomiting Anesthetic complications: yes   Encounter Notable Events  Notable Event Outcome Phase Comment  Difficult to intubate - unexpected  Intraprocedure Filed from anesthesia note documentation.    Last Vitals:  Vitals:   02/27/23 1350 02/27/23 1400  BP: 115/64 119/60  Pulse: (!) 55 (!) 56  Resp: 13 14  Temp:    SpO2: 93% 93%    Last Pain:  Vitals:   02/27/23 1400  TempSrc:   PainSc: 0-No pain                 Trevor Iha

## 2023-02-27 NOTE — Transfer of Care (Signed)
Immediate Anesthesia Transfer of Care Note  Patient: ANDERSSON LARRABEE  Procedure(s) Performed: ENDOSCOPIC RETROGRADE CHOLANGIOPANCREATOGRAPHY (ERCP) (Bilateral) BIOPSY BILIARY BRUSHING REMOVAL OF SLUDGE BILIARY STENT PLACEMENT  Patient Location: PACU  Anesthesia Type:General  Level of Consciousness: sedated  Airway & Oxygen Therapy: Patient Spontanous Breathing and Patient connected to face mask oxygen  Post-op Assessment: Report given to RN and Post -op Vital signs reviewed and stable  Post vital signs: Reviewed and stable  Last Vitals:  Vitals Value Taken Time  BP 126/60 02/27/23 1335  Temp    Pulse 56 02/27/23 1339  Resp 17 02/27/23 1339  SpO2 100 % 02/27/23 1339  Vitals shown include unfiled device data.  Last Pain:  Vitals:   02/27/23 0958  TempSrc: Temporal  PainSc: 0-No pain         Complications:  Encounter Notable Events  Notable Event Outcome Phase Comment  Difficult to intubate - unexpected  Intraprocedure Filed from anesthesia note documentation.

## 2023-02-27 NOTE — Discharge Instructions (Signed)

## 2023-02-27 NOTE — Progress Notes (Signed)
Patrick Collier 10:16 AM  Subjective: Patient without any new complaints since we saw him recently in the office and his case again discussed with his wife we rediscussed the procedure and discussed indications for replacing the stent  Objective: Vital signs stable afebrile no acute distress exam please see preassessment evaluation labs reviewed LFTs improved tumor marker increased  Assessment: Concerns over recurrent cancer and stent possible occlusion  Plan: Okay to proceed with repeat ERCP and case thoroughly discussed with the wife and patient as well and oncology team  [facility]  office [phone] After 5PM or if no answer call [phone]

## 2023-02-27 NOTE — Anesthesia Procedure Notes (Signed)
Procedure Name: Intubation Date/Time: 02/27/2023 12:03 PM  Performed by: Floydene Flock, CRNAPre-anesthesia Checklist: Patient identified, Emergency Drugs available, Suction available, Patient being monitored and Timeout performed Patient Re-evaluated:Patient Re-evaluated prior to induction Oxygen Delivery Method: Circle system utilized Preoxygenation: Pre-oxygenation with 100% oxygen Induction Type: IV induction and Cricoid Pressure applied Ventilation: Mask ventilation without difficulty Laryngoscope Size: Mac, 3, 4 and Glidescope Grade View: Grade III Tube type: Oral Tube size: 7.5 mm Number of attempts: 3 Airway Equipment and Method: Cricothyrotomy, Stylet, Bougie stylet and Video-laryngoscopy Placement Confirmation: ETT inserted through vocal cords under direct vision, positive ETCO2, CO2 detector and breath sounds checked- equal and bilateral Secured at: 23 cm Tube secured with: Tape Dental Injury: Teeth and Oropharynx as per pre-operative assessment and Injury to lip  Difficulty Due To: Difficulty was unanticipated, Difficult Airway- due to immobile epiglottis, Difficult Airway- due to anterior larynx and Difficult Airway-  due to edematous airway Comments: Easy mask ventilation. No view with laryngoscope other than very long epiglottis, unable to pass tube under epiglottis, attempt with bougie also failed (esophageal intubation).  Glidescope successful, airway appears edematous and very small, anterior glottis. Lip injury with bleeding at some point during intubation attempts to the upper right lip.

## 2023-02-27 NOTE — Op Note (Signed)
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 Estimated Blood Loss:     Estimated blood loss was minimal. Procedure:                Pre-Anesthesia Assessment:                           - Prior to the procedure, a History and Physical                            was performed, and patient medications and                            allergies were reviewed. The patient's tolerance of                            previous anesthesia was also reviewed. The risks                            and benefits of the procedure and the sedation                            options and risks were discussed with the patient.                            All questions were answered, and informed consent                            was obtained. Prior Anticoagulants: The patient has                            taken no anticoagulant or antiplatelet agents. ASA                            Grade Assessment:  III - A patient with severe                            systemic disease. After reviewing the risks and                            benefits, the patient was deemed in satisfactory                            condition to undergo the procedure.                           After obtaining informed consent, the scope was                            passed under direct vision. Throughout  the                            procedure, the patient's blood pressure, pulse, and                            oxygen saturations were monitored continuously. The                            [id number]-[id number] ([id number] Olympus duodenoscope was                            introduced through the mouth, and used to inject                            contrast into and used to cannulate the bile duct.                            The ERCP was technically difficult and complex due                            to challenging cannulation because of abnormal                            anatomy. Successful completion of the procedure was                            aided by performing the maneuvers documented                            (below) in this report. The patient tolerated the                            procedure well. Passing the scope was difficult due                            to a narrowing of the distal bulb and with rolling  him on his left side we were able to pass the scope                            into the second portion of the duodenum Scope In: Scope Out: Findings:      One uncovered metal biliary stent originating in the biliary tree was       emerging from the major papilla. The stent was visibly occluded with       both food debris and what looked like tumor. We initially tried to       cannulate with the balloon and the wire and were unsuccessful so then we       tried the sphincterotome loaded with the wire and again were       unsuccessful with multiple wires going through the stent but not up the       bile duct so we elected to take biopsies of the the lower third of the       main bile duct which was biopsied with a small pediatric cold forceps       for histology. We then retried the sphincterotome and this time we were       successful in obtaining cannulation and dye was injected which confirmed       the distal part of the stent being obstructed and cells  for cytology       were obtained by brushing in the lower third of the main bile duct. To       discover objects, the biliary tree was swept with a 9 mm balloon       starting at the bifurcation. Sludge and debris was swept from the duct       initially. But then nothing was found on subsequent pull-through's. One       10 Fr by 6 cm uncovered metal biliary stent with no external flaps and       no internal flaps was placed 5 cm into the common bile duct and through       the previously placed stent. Necrotic tissue flowed through the stent.       The stent was in good position. Impression:               - One visibly occluded stent from the biliary tree                            was seen in the major papilla.                           - Biopsy was performed in the lower third of the                            main duct.                           - Cells for cytology obtained in the lower third of                            the main duct.                           -  The biliary tree was swept and sludge and debris                            was initially removed and nothing were found on                            subsequent pull-through's.                           - One uncovered metal biliary stent was placed into                            the common bile duct. Moderate Sedation:      Not Applicable - Patient had care per Anesthesia. Recommendation:           - Clear liquid diet for 6 hours. If doing well at 7                            PM may have soft solids                           - Continue present medications.                           - Await cytology results and await path results.                           - Telephone GI clinic for pathology results in 1                            week.                           - Telephone GI clinic if symptomatic PRN.                           - Return to GI clinic PRN. Follow-up with your                            oncologist per their  recommendation Procedure Code(s):        --- Professional ---                           [phone], Endoscopic retrograde                            cholangiopancreatography (ERCP); with removal and                            exchange of stent(s), biliary or pancreatic duct,                            including pre- and post-dilation and guide wire  passage, when performed, including sphincterotomy,                            when performed, each stent exchanged                           43264, Endoscopic retrograde                            cholangiopancreatography (ERCP); with removal of                            calculi/debris from biliary/pancreatic duct(s)                           43261, 59, Endoscopic retrograde                            cholangiopancreatography (ERCP); with biopsy,                            single or multiple Diagnosis Code(s):        --- Professional ---                           X91.478G, Other mechanical complication of bile                            duct prosthesis, initial encounter                           R74.8, Abnormal levels of other serum enzymes                           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 All rights reserved. The codes documented in this report are preliminary and upon coder review may  be revised to meet current compliance requirements. Vida Rigger, MD 02/27/2023 1:25:47 PM This report has been signed electronically. Number of Addenda: 0

## 2023-02-28 LAB — CYTOLOGY - NON PAP

## 2023-02-28 LAB — SURGICAL PATHOLOGY

## 2023-03-01 ENCOUNTER — Encounter (HOSPITAL_COMMUNITY): Payer: Self-pay | Admitting: Gastroenterology

## 2023-03-03 DIAGNOSIS — C221 Intrahepatic bile duct carcinoma: Secondary | ICD-10-CM | POA: Diagnosis not present

## 2023-03-12 DIAGNOSIS — E039 Hypothyroidism, unspecified: Secondary | ICD-10-CM | POA: Diagnosis not present

## 2023-03-22 ENCOUNTER — Telehealth: Payer: Self-pay | Admitting: Hematology

## 2023-04-04 ENCOUNTER — Inpatient Hospital Stay: Payer: Medicare Other | Attending: Physician Assistant

## 2023-04-04 ENCOUNTER — Inpatient Hospital Stay (HOSPITAL_BASED_OUTPATIENT_CLINIC_OR_DEPARTMENT_OTHER): Payer: Medicare Other | Admitting: Hematology

## 2023-04-04 VITALS — BP 123/77 | HR 60 | Temp 97.7°F | Resp 15 | Wt 172.7 lb

## 2023-04-04 DIAGNOSIS — Z923 Personal history of irradiation: Secondary | ICD-10-CM | POA: Diagnosis not present

## 2023-04-04 DIAGNOSIS — F0390 Unspecified dementia without behavioral disturbance: Secondary | ICD-10-CM | POA: Diagnosis not present

## 2023-04-04 DIAGNOSIS — C221 Intrahepatic bile duct carcinoma: Secondary | ICD-10-CM | POA: Insufficient documentation

## 2023-04-04 DIAGNOSIS — R748 Abnormal levels of other serum enzymes: Secondary | ICD-10-CM | POA: Insufficient documentation

## 2023-04-04 DIAGNOSIS — Z9221 Personal history of antineoplastic chemotherapy: Secondary | ICD-10-CM | POA: Insufficient documentation

## 2023-04-04 LAB — CBC WITH DIFFERENTIAL/PLATELET
Abs Immature Granulocytes: 0.01 10*3/uL (ref 0.00–0.07)
Basophils Absolute: 0 10*3/uL (ref 0.0–0.1)
Basophils Relative: 0 %
Eosinophils Absolute: 0.1 10*3/uL (ref 0.0–0.5)
Eosinophils Relative: 2 %
HCT: 40.6 % (ref 39.0–52.0)
Hemoglobin: 13.7 g/dL (ref 13.0–17.0)
Immature Granulocytes: 0 %
Lymphocytes Relative: 13 %
Lymphs Abs: 0.6 10*3/uL — ABNORMAL LOW (ref 0.7–4.0)
MCH: 32.9 pg (ref 26.0–34.0)
MCHC: 33.7 g/dL (ref 30.0–36.0)
MCV: 97.4 fL (ref 80.0–100.0)
Monocytes Absolute: 0.6 10*3/uL (ref 0.1–1.0)
Monocytes Relative: 14 %
Neutro Abs: 3.3 10*3/uL (ref 1.7–7.7)
Neutrophils Relative %: 71 %
Platelets: 231 10*3/uL (ref 150–400)
RBC: 4.17 MIL/uL — ABNORMAL LOW (ref 4.22–5.81)
RDW: 12.2 % (ref 11.5–15.5)
WBC: 4.6 10*3/uL (ref 4.0–10.5)
nRBC: 0 % (ref 0.0–0.2)

## 2023-04-04 LAB — COMPREHENSIVE METABOLIC PANEL WITH GFR
ALT: 27 U/L (ref 0–44)
AST: 29 U/L (ref 15–41)
Albumin: 3.7 g/dL (ref 3.5–5.0)
Alkaline Phosphatase: 147 U/L — ABNORMAL HIGH (ref 38–126)
Anion gap: 4 — ABNORMAL LOW (ref 5–15)
BUN: 11 mg/dL (ref 8–23)
CO2: 32 mmol/L (ref 22–32)
Calcium: 9.2 mg/dL (ref 8.9–10.3)
Chloride: 103 mmol/L (ref 98–111)
Creatinine, Ser: 0.96 mg/dL (ref 0.61–1.24)
GFR, Estimated: >60 mL/min
Glucose, Bld: 121 mg/dL — ABNORMAL HIGH (ref 70–99)
Potassium: 4.2 mmol/L (ref 3.5–5.1)
Sodium: 139 mmol/L (ref 135–145)
Total Bilirubin: 0.7 mg/dL
Total Protein: 7.2 g/dL (ref 6.5–8.1)

## 2023-04-04 NOTE — Assessment & Plan Note (Addendum)
[id number] -presented with elevated LFT's, jaundice, 10-15 lb weight loss. CT AP 10/06/21 showed biliary obstruction, he was referred to ED. -baseline CA 19-9 on 10/06/21 elevated to 129. -s/p ERCP 10/11/21 inpatient showed a mass in common bile duct, cytology not diagnostic showing only suspicious atypical cells. S/p stent placement  -staging chest CT 11/03/21 was negative. -he met Dr. Freida Busman on 11/08/21 and was offered surgery, but pt declined. -repeat ERCP on 11/21/21 for stent change, path and cytology were again non-diagnostic but suspicious. -he received radiation 12/04/21 - 01/16/22. He tried concurrent Xeloda for a week but tolerated poorly with nausea, weight loss, and rash to his hands and face, discontinued 8/22.  -He developed cholecystitis during treatment which was managed with percutaneous cholecystostomy  -restaging CT 07/02/2022 showed Decompression of the gallbladder with resolution of adjacent fluid collection since previous imaging, no evidence of cancer progression  -restaging CT 9/9 showed stable soft tissue in porta hepatis, no evidence of cancer progression.  His tumor marker CA 19.9 has been rising lately -CT chest 01/23/2023 was negative, but PET 02/12/2023 showed hypermetabolic activity along the mid and distal aspect of the biliary stent. He underwent  -He underwent a ERCP and stent exchange on February 27, 2023, cytology showed atypical cells, suspicious for malignancy. -NGS attempted but insufficient tumor tissue for the test -After lengthy discussion, patient and his wife decided to continue supportive care alone.

## 2023-04-04 NOTE — Progress Notes (Signed)
[facility]   [phone] [phone]   Clinic Follow up Note   Patient Care Team: Farris Has, MD as PCP - General (Family Medicine) Jens Som Madolyn Frieze, MD as PCP - Cardiology (Cardiology) Jens Som Madolyn Frieze, MD as Consulting Physician (Cardiology) Malachy Mood, MD as Consulting Physician (Hematology and Oncology)  Date of Service:  04/04/2023  CHIEF COMPLAINT: f/u of cholangiocarcinoma  CURRENT THERAPY:  Supportive care  Oncology History   Cholangiocarcinoma (HCC) cTxN0M0 -presented with elevated LFT's, jaundice, 10-15 lb weight loss. CT AP 10/06/21 showed biliary obstruction, he was referred to ED. -baseline CA 19-9 on 10/06/21 elevated to 129. -s/p ERCP 10/11/21 inpatient showed a mass in common bile duct, cytology not diagnostic showing only suspicious atypical cells. S/p stent placement  -staging chest CT 11/03/21 was negative. -he met Dr. Freida Busman on 11/08/21 and was offered surgery, but pt declined. -repeat ERCP on 11/21/21 for stent change, path and cytology were again non-diagnostic but suspicious. -he received radiation 12/04/21 - 01/16/22. He tried concurrent Xeloda for a week but tolerated poorly with nausea, weight loss, and rash to his hands and face, discontinued 8/22.  -He developed cholecystitis during treatment which was managed with percutaneous cholecystostomy  -restaging CT 07/02/2022 showed Decompression of the gallbladder with resolution of adjacent fluid collection since previous imaging, no evidence of cancer progression  -restaging CT 9/9 showed stable soft tissue in porta hepatis, no evidence of cancer progression.  His tumor marker CA 19.9 has been rising lately -CT chest 01/23/2023 was negative, but PET 02/12/2023 showed hypermetabolic activity along the mid and distal aspect of the biliary stent. He underwent  -He underwent a ERCP and stent exchange on February 27, 2023, cytology showed atypical cells, suspicious for malignancy. -NGS  attempted but insufficient tumor tissue for the test -After lengthy discussion, patient and his wife decided to continue supportive care alone.     Assessment and Plan    Cholangiocarcinoma Follow-up for cholangiocarcinoma. Recent stent change with atypical cells noted on brushing, indicating possible residual cancer. No evidence of metastasis. Experiencing fatigue, weight loss, and poor appetite, likely related to cancer. Discussed options including oral chemotherapy Xeloda, benefit and potential side effects are reviewed with him again.  He previously did not tolerate Xarelto when he was on radiation.  We also discussed option of supportive care alone due to his advanced age and mild dementia.  After lengthy discussion, patient prefers to monitor condition without aggressive treatment, prioritizing quality of life. - Encourage liquid nutrition (e.g., Ensure, Boost) - Schedule follow-up in 2-3 months - Instruct to watch for signs of stent blockage (fever, chills, pain, jaundice) and call if symptoms occur - Discuss potential for home care or hospice if condition worsens - Offer phone visits if unable to come to the clinic  Dementia Dementia complicates care. Caregiver reports managing well but requires close supervision. - Ensure caregiver support - Monitor for changes in cognitive function - Discuss potential need for additional home support if condition worsens  General Health Maintenance Discussed importance of maintaining nutrition and monitoring for new symptoms. - Encourage regular intake of liquid nutrition - Monitor weight and appetite - Consider appetite stimulants if necessary, balancing potential side effects  Plan -We discussed option of oral chemo Xeloda versus supportive care alone, patient and his wife decided to continue supportive care. - Schedule follow-up appointment in 2-3 months - Offer phone visits if unable to attend in person - Instruct to call if any new  symptoms or concerns arise.  SUMMARY OF ONCOLOGIC HISTORY: Oncology History Overview Note   Cancer Staging  Cholangiocarcinoma [facility]) Staging form: Distal Bile Duct, AJCC 8th Edition - Clinical stage from 11/21/2021: Stage Unknown (cTX, cN0, cM0) - Signed by Malachy Mood, MD on 12/12/2021 Total positive nodes: 0     Cholangiocarcinoma (HCC)  10/07/2021 Imaging   MR ABDOMEN MRCP W WO CONTAST  IMPRESSION: 1. Severe biliary ductal dilation of both the intra and extrahepatic bile ducts with abrupt cutoff seen at the distal common bile duct. Findings are concerning for stricture or obstructing tumor. No obstructing mass is seen, although motion artifact on contrast-enhanced imaging limits evaluation. Recommend correlation with ERCP. 2. Small cystic lesions of the pancreatic tail, largest measures 10 mm. Recommend follow up pre and post contrast MRI/MRCP or pancreatic protocol CT in 2 years. This recommendation follows ACR consensus guidelines: Management of Incidental Pancreatic Cysts: A White Paper of the ACR Incidental Findings Committee. J Am Coll Radiol 2017;14:911-923. 3. Trace perihepatic ascites and trace bilateral pleural effusions.   10/11/2021 Procedure   ERCP-Dr. Russella Dar  Impression:  -I single localized malignant appearing severe biliary stricture was found in the lower third of the main bile duct. -The proximal biliary tree was dilated, secondary to stricture -Biliary sphincterotomy was performed -Cells for cytology obtained in the lower third of the main duct -One plastic stent was placed into the common bile duct.     10/11/2021 Pathology Results   CYTOLOGY - NON PAP  CASE: [id number]  PATIENT: Patrick Collier  Non-Gynecological Cytology Report   FINAL MICROSCOPIC DIAGNOSIS:  A. COMMON BILE DUCT, LESION, FINE NEEDLE ASPIRATION:  - No malignant cells seen  -Scant cellularity; benign/reactive ductal cells and ductal epithelium  with acute inflammatory cells    B. COMMON BILE DUCT, STRICTURE, BRUSHING:  - Atypical cells suspicious for tumor present    10/26/2021 Initial Diagnosis   Cholangiocarcinoma (HCC)    Procedure   Upper EUS-Dr. Dulce Sellar  Impression:  - There was no sign of significant pathology in the ampulla. - A mass was found in the common bile duct. Fine needle aspiration performed. - There was no sign of significant pathology in the pancreatic head, genu of the pancreas and pancreatic body. - A few abnormal lymph nodes were visualized in the peripancreatic region and porta hepatis region. - There was dilation in the middle third of the main bile duct, in the upper third of the main bile duct and in the common hepatic duct which measured up to 15 mm. - Small perihepatic ascites. - Overall constellation findings most consistent mid common bile duct cholangiocarcinoma.   11/21/2021 Cancer Staging   Staging form: Distal Bile Duct, AJCC 8th Edition - Clinical stage from 11/21/2021: Stage Unknown (cTX, cN0, cM0) - Signed by Malachy Mood, MD on 12/12/2021 Total positive nodes: 0   07/02/2022 Imaging    IMPRESSION: 1. Decompression of the gallbladder with resolution of adjacent fluid collection since previous imaging. 2. Mild stranding about the porta hepatis and stranding/soft tissue density surrounding replaced RIGHT hepatic artery which arises from the SMA. 3. Under distension versus thickening of the hepatic flexure of the colon adjacent to the hepatic duodenal ligament just inferior to the gallbladder. No substantial surrounding stranding in this area. Correlate with any symptoms of colitis, this is favored to represent decompression versus mild secondary changes adjacent of the colon in the setting of previous inflammatory change. Could consider short interval follow-up or Cologuard correlation. Could also consider direct visualization as warranted for further assessment as  a more aggressive approach. 4. Stable cystic lesion in  the body-tail of the pancreas. This may represent a small pseudocyst or indolent cystic neoplasm. Attention on follow-up. 5. Aortic atherosclerosis. 6. Fat containing LEFT inguinal and umbilical hernias are small. 7. Signs of aortic valve and mitral valve calcification. 8. Colonic diverticulosis.   12/31/2022 Imaging   CT Abdomen and Pelvis with Contrast  IMPRESSION: 1. Unchanged mild soft tissue stranding about the porta hepatis. No evidence of progressive disease in the abdomen or pelvis. 2. Similar intrahepatic biliary ductal dilation and pneumobilia with common bile duct stent in place. 3. Unchanged cystic lesion of the tail of the pancreas measuring 13 mm. Recommend attention on follow-up. 4. Mild aortic Atherosclerosis (ICD10-I70.0).       Discussed the use of AI scribe software for clinical note transcription with the patient, who gave verbal consent to proceed.  History of Present Illness   The patient, an 82 year old gentleman with a history of cholangiocarcinoma, presents for a follow-up visit. The patient reports feeling better after a recent stent change. However, the patient's spouse reports that the patient's appetite has been poor and he has been gradually losing weight. The patient's weight has decreased from 179 lbs in September to 172 lbs at the current visit. The patient denies any pain and reports feeling good overall, but the spouse notes that the patient gets tired easily. The patient has not been taking any liquid nutrition supplements such as Ensure or Boost. The patient's spouse also mentions that the patient has dementia and requires supervision.         All other systems were reviewed with the patient and are negative.  MEDICAL HISTORY:  Past Medical History:  Diagnosis Date   Arthritis    Back pain    Cancer (HCC)    Dementia (HCC)    Heart murmur    History of kidney stones    Hypothyroidism    ICH (intracerebral hemorrhage) (HCC)     SURGICAL  HISTORY: Past Surgical History:  Procedure Laterality Date   BACK SURGERY     BILIARY BRUSHING  10/11/2021   Procedure: BILIARY BRUSHING;  Surgeon: Meryl Dare, MD;  Location: [facility];  Service: Gastroenterology;;   BILIARY BRUSHING  11/21/2021   Procedure: BILIARY BRUSHING;  Surgeon: Vida Rigger, MD;  Location: [facility];  Service: Gastroenterology;;   BILIARY BRUSHING  02/27/2023   Procedure: BILIARY BRUSHING;  Surgeon: Vida Rigger, MD;  Location: [facility];  Service: Gastroenterology;;   BILIARY STENT PLACEMENT N/A 10/11/2021   Procedure: BILIARY STENT PLACEMENT;  Surgeon: Meryl Dare, MD;  Location: [facility];  Service: Gastroenterology;  Laterality: N/A;   BILIARY STENT PLACEMENT N/A 11/21/2021   Procedure: BILIARY STENT PLACEMENT;  Surgeon: Vida Rigger, MD;  Location: [facility];  Service: Gastroenterology;  Laterality: N/A;   BILIARY STENT PLACEMENT N/A 02/27/2023   Procedure: BILIARY STENT PLACEMENT;  Surgeon: Vida Rigger, MD;  Location: [facility];  Service: Gastroenterology;  Laterality: N/A;   BIOPSY  11/21/2021   Procedure: BIOPSY;  Surgeon: Vida Rigger, MD;  Location: [facility];  Service: Gastroenterology;;   BIOPSY  02/27/2023   Procedure: BIOPSY;  Surgeon: Vida Rigger, MD;  Location: [facility];  Service: Gastroenterology;;   ERCP N/A 10/11/2021   Procedure: ENDOSCOPIC RETROGRADE CHOLANGIOPANCREATOGRAPHY (ERCP);  Surgeon: Meryl Dare, MD;  Location: [facility];  Service: Gastroenterology;  Laterality: N/A;   ERCP N/A 11/21/2021   Procedure: ENDOSCOPIC RETROGRADE CHOLANGIOPANCREATOGRAPHY (ERCP);  Surgeon: Vida Rigger, MD;  Location:  [facility];  Service: Gastroenterology;  Laterality: N/A;  with Spyglass   ERCP Bilateral 02/27/2023   Procedure: ENDOSCOPIC RETROGRADE CHOLANGIOPANCREATOGRAPHY (ERCP);  Surgeon: Vida Rigger, MD;  Location: [facility];  Service: Gastroenterology;  Laterality: Bilateral;   ESOPHAGOGASTRODUODENOSCOPY (EGD) WITH PROPOFOL  N/A 10/11/2021   Procedure: ESOPHAGOGASTRODUODENOSCOPY (EGD) WITH PROPOFOL;  Surgeon: Willis Modena, MD;  Location: [facility];  Service: Gastroenterology;  Laterality: N/A;   EUS N/A 10/11/2021   Procedure: UPPER ENDOSCOPIC ULTRASOUND (EUS) LINEAR;  Surgeon: Willis Modena, MD;  Location: [facility];  Service: Gastroenterology;  Laterality: N/A;   FINE NEEDLE ASPIRATION N/A 10/11/2021   Procedure: FINE NEEDLE ASPIRATION (FNA) LINEAR;  Surgeon: Willis Modena, MD;  Location: [facility];  Service: Gastroenterology;  Laterality: N/A;   INGUINAL HERNIA REPAIR     IR EXCHANGE BILIARY DRAIN  01/22/2022   IR EXCHANGE BILIARY DRAIN  01/26/2022   IR EXCHANGE BILIARY DRAIN  03/05/2022   IR PERC CHOLECYSTOSTOMY  01/02/2022   KNEE ARTHROPLASTY Right 05/16/2017   Procedure: RIGHT TOTAL KNEE ARTHROPLASTY WITH COMPUTER NAVIGATION;  Surgeon: Samson Frederic, MD;  Location: [facility];  Service: Orthopedics;  Laterality: Right;  Needs RNFA   NECK SURGERY     REMOVAL OF STONES  02/27/2023   Procedure: REMOVAL OF SLUDGE;  Surgeon: Vida Rigger, MD;  Location: [facility];  Service: Gastroenterology;;   Dennison Mascot  10/11/2021   Procedure: SPHINCTEROTOMY;  Surgeon: Meryl Dare, MD;  Location: [facility];  Service: Gastroenterology;;   Dennison Mascot  11/21/2021   Procedure: Dennison Mascot;  Surgeon: Vida Rigger, MD;  Location: [facility];  Service: Gastroenterology;;   [facility] CHOLANGIOSCOPY N/A 11/21/2021   Procedure: HQIONGEX CHOLANGIOSCOPY;  Surgeon: Vida Rigger, MD;  Location: [facility];  Service: Gastroenterology;  Laterality: N/A;   STENT REMOVAL  11/21/2021   Procedure: STENT REMOVAL;  Surgeon: Vida Rigger, MD;  Location: [facility];  Service: Gastroenterology;;   TONSILLECTOMY      I have reviewed the social history and family history with the patient and they are unchanged from previous note.  ALLERGIES:  has no known allergies.  MEDICATIONS:  Current Outpatient Medications   Medication Sig Dispense Refill   acetaminophen (TYLENOL) 500 MG tablet Take 1,000 mg by mouth daily as needed (pain).     amoxicillin (AMOXIL) 500 MG tablet Take 500 mg by mouth as needed (PRIOR TO DENTAL APPOINTMENT).     levothyroxine (SYNTHROID) 137 MCG tablet Take 137 mcg by mouth daily before breakfast.     No current facility-administered medications for this visit.    PHYSICAL EXAMINATION: ECOG PERFORMANCE STATUS: 2 - Symptomatic, <50% confined to bed  Vitals:   04/04/23 1458  BP: 123/77  Pulse: 60  Resp: 15  Temp: 97.7 F (36.5 C)  SpO2: 99%   Wt Readings from Last 3 Encounters:  04/04/23 172 lb 11.2 oz (78.3 kg)  02/27/23 175 lb (79.4 kg)  01/29/23 175 lb 9.6 oz (79.7 kg)     GENERAL:alert, no distress and comfortable SKIN: skin color, texture, turgor are normal, no rashes or significant lesions EYES: normal, Conjunctiva are pink and non-injected, sclera clear NECK: supple, thyroid normal size, non-tender, without nodularity LYMPH:  no palpable lymphadenopathy in the cervical, axillary  LUNGS: clear to auscultation and percussion with normal breathing effort HEART: regular rate & rhythm and no murmurs and no lower extremity edema ABDOMEN:abdomen soft, non-tender and normal bowel sounds Musculoskeletal:no cyanosis of digits and no clubbing  NEURO: alert & oriented x 3 with fluent speech, no focal motor/sensory deficits  LABORATORY DATA:  I have reviewed the data as listed    Latest Ref Rng & Units 04/04/2023    2:17 PM 02/12/2023   11:47 AM 01/23/2023    1:54 PM  CBC  WBC 4.0 - 10.5 K/uL 4.6  4.7  5.0   Hemoglobin 13.0 - 17.0 g/dL 16.1  09.6  04.5   Hematocrit 39.0 - 52.0 % 40.6  39.7  36.6   Platelets 150 - 400 K/uL 231  231  268         Latest Ref Rng & Units 04/04/2023    2:17 PM 02/12/2023   11:47 AM 01/23/2023    1:54 PM  CMP  Glucose 70 - 99 mg/dL 409  83  811   BUN 8 - 23 mg/dL 11  13  9    Creatinine 0.61 - 1.24 mg/dL 9.14  7.82  9.56    Sodium 135 - 145 mmol/L 139  139  139   Potassium 3.5 - 5.1 mmol/L 4.2  4.2  4.1   Chloride 98 - 111 mmol/L 103  103  104   CO2 22 - 32 mmol/L 32  31  32   Calcium 8.9 - 10.3 mg/dL 9.2  9.6  8.9   Total Protein 6.5 - 8.1 g/dL 7.2  7.6  7.1   Total Bilirubin <1.2 mg/dL 0.7  0.9  1.0   Alkaline Phos 38 - 126 U/L 147  210  319   AST 15 - 41 U/L 29  38  59   ALT 0 - 44 U/L 27  39  82       RADIOGRAPHIC STUDIES: I have personally reviewed the radiological images as listed and agreed with the findings in the report. No results found.    No orders of the defined types were placed in this encounter.  Patient's wife had many questions, and lots of complaints about his health care providers not keeping them updated regarding test results.  I answered all her questions to the best of my ability.  The patient knows to call the clinic with any problems, questions or concerns. No barriers to learning was detected. The total time spent in the appointment was 40 minutes.     Malachy Mood, MD 04/04/2023

## 2023-04-05 LAB — CANCER ANTIGEN 19-9: CA 19-9: 513 U/mL — ABNORMAL HIGH (ref 0–35)

## 2023-04-09 ENCOUNTER — Telehealth: Payer: Self-pay | Admitting: Hematology

## 2023-04-21 ENCOUNTER — Emergency Department (HOSPITAL_COMMUNITY)
Admission: EM | Admit: 2023-04-21 | Discharge: 2023-04-21 | Disposition: A | Discharge status: Home / Self Care | Payer: Medicare Other | Attending: Emergency Medicine | Admitting: Emergency Medicine

## 2023-04-21 ENCOUNTER — Emergency Department (HOSPITAL_COMMUNITY): Payer: Medicare Other

## 2023-04-21 ENCOUNTER — Other Ambulatory Visit: Payer: Self-pay

## 2023-04-21 ENCOUNTER — Encounter (HOSPITAL_COMMUNITY): Payer: Self-pay

## 2023-04-21 DIAGNOSIS — K529 Noninfective gastroenteritis and colitis, unspecified: Secondary | ICD-10-CM | POA: Insufficient documentation

## 2023-04-21 DIAGNOSIS — E039 Hypothyroidism, unspecified: Secondary | ICD-10-CM | POA: Insufficient documentation

## 2023-04-21 DIAGNOSIS — A09 Infectious gastroenteritis and colitis, unspecified: Secondary | ICD-10-CM | POA: Insufficient documentation

## 2023-04-21 DIAGNOSIS — R109 Unspecified abdominal pain: Secondary | ICD-10-CM | POA: Diagnosis not present

## 2023-04-21 DIAGNOSIS — R1011 Right upper quadrant pain: Secondary | ICD-10-CM | POA: Diagnosis not present

## 2023-04-21 DIAGNOSIS — C240 Malignant neoplasm of extrahepatic bile duct: Secondary | ICD-10-CM | POA: Diagnosis not present

## 2023-04-21 DIAGNOSIS — R5383 Other fatigue: Secondary | ICD-10-CM | POA: Insufficient documentation

## 2023-04-21 DIAGNOSIS — C221 Intrahepatic bile duct carcinoma: Secondary | ICD-10-CM | POA: Diagnosis not present

## 2023-04-21 DIAGNOSIS — Z4682 Encounter for fitting and adjustment of non-vascular catheter: Secondary | ICD-10-CM | POA: Diagnosis not present

## 2023-04-21 DIAGNOSIS — N200 Calculus of kidney: Secondary | ICD-10-CM | POA: Diagnosis not present

## 2023-04-21 DIAGNOSIS — R630 Anorexia: Secondary | ICD-10-CM | POA: Diagnosis not present

## 2023-04-21 DIAGNOSIS — F0390 Unspecified dementia without behavioral disturbance: Secondary | ICD-10-CM | POA: Insufficient documentation

## 2023-04-21 DIAGNOSIS — R188 Other ascites: Secondary | ICD-10-CM | POA: Diagnosis not present

## 2023-04-21 DIAGNOSIS — R197 Diarrhea, unspecified: Secondary | ICD-10-CM | POA: Diagnosis not present

## 2023-04-21 DIAGNOSIS — K820 Obstruction of gallbladder: Secondary | ICD-10-CM | POA: Diagnosis not present

## 2023-04-21 DIAGNOSIS — Z79899 Other long term (current) drug therapy: Secondary | ICD-10-CM | POA: Insufficient documentation

## 2023-04-21 LAB — COMPREHENSIVE METABOLIC PANEL
ALT: 23 U/L (ref 0–44)
AST: 29 U/L (ref 15–41)
Albumin: 2.9 g/dL — ABNORMAL LOW (ref 3.5–5.0)
Alkaline Phosphatase: 123 U/L (ref 38–126)
Anion gap: 8 (ref 5–15)
BUN: 9 mg/dL (ref 8–23)
CO2: 25 mmol/L (ref 22–32)
Calcium: 8.3 mg/dL — ABNORMAL LOW (ref 8.9–10.3)
Chloride: 102 mmol/L (ref 98–111)
Creatinine, Ser: 0.92 mg/dL (ref 0.61–1.24)
GFR, Estimated: >60 mL/min (ref >60)
Glucose, Bld: 173 mg/dL — ABNORMAL HIGH (ref 70–99)
Potassium: 2.8 mmol/L — ABNORMAL LOW (ref 3.5–5.1)
Sodium: 135 mmol/L (ref 135–145)
Total Bilirubin: 0.9 mg/dL (ref <1.2)
Total Protein: 6.8 g/dL (ref 6.5–8.1)

## 2023-04-21 LAB — URINALYSIS, W/ REFLEX TO CULTURE (INFECTION SUSPECTED)
Bilirubin Urine: NEGATIVE
Glucose, UA: NEGATIVE mg/dL
Hgb urine dipstick: NEGATIVE
Ketones, ur: NEGATIVE mg/dL
Leukocytes,Ua: NEGATIVE
Nitrite: NEGATIVE
Protein, ur: NEGATIVE mg/dL
Specific Gravity, Urine: 1.012 (ref 1.005–1.030)
pH: 6 (ref 5.0–8.0)

## 2023-04-21 LAB — CBC WITH DIFFERENTIAL/PLATELET
Abs Immature Granulocytes: 0.01 10*3/uL (ref 0.00–0.07)
Basophils Absolute: 0 10*3/uL (ref 0.0–0.1)
Basophils Relative: 0 %
Eosinophils Absolute: 0.1 10*3/uL (ref 0.0–0.5)
Eosinophils Relative: 2 %
HCT: 35.8 % — ABNORMAL LOW (ref 39.0–52.0)
Hemoglobin: 12.2 g/dL — ABNORMAL LOW (ref 13.0–17.0)
Immature Granulocytes: 0 %
Lymphocytes Relative: 8 %
Lymphs Abs: 0.4 10*3/uL — ABNORMAL LOW (ref 0.7–4.0)
MCH: 32.5 pg (ref 26.0–34.0)
MCHC: 34.1 g/dL (ref 30.0–36.0)
MCV: 95.5 fL (ref 80.0–100.0)
Monocytes Absolute: 0.9 10*3/uL (ref 0.1–1.0)
Monocytes Relative: 18 %
Neutro Abs: 3.7 10*3/uL (ref 1.7–7.7)
Neutrophils Relative %: 72 %
Platelets: 235 10*3/uL (ref 150–400)
RBC: 3.75 MIL/uL — ABNORMAL LOW (ref 4.22–5.81)
RDW: 12.2 % (ref 11.5–15.5)
WBC: 5.1 10*3/uL (ref 4.0–10.5)
nRBC: 0 % (ref 0.0–0.2)

## 2023-04-21 LAB — LACTIC ACID, PLASMA
Lactic Acid, Venous: 1.7 mmol/L (ref 0.5–1.9)
Lactic Acid, Venous: 1.3 mmol/L (ref 0.5–1.9)

## 2023-04-21 LAB — PROTIME-INR
INR: 1.1 (ref 0.8–1.2)
Prothrombin Time: 14.5 s (ref 11.4–15.2)

## 2023-04-21 LAB — LIPASE, BLOOD: Lipase: 24 U/L (ref 11–51)

## 2023-04-21 LAB — AMMONIA: Ammonia: 18 umol/L (ref 9–35)

## 2023-04-21 MED ORDER — AMOXICILLIN-POT CLAVULANATE 875-125 MG PO TABS: 1.0000 | ORAL_TABLET | Freq: Two times a day (BID) | ORAL | 0 refills | Status: AC

## 2023-04-21 MED ORDER — POTASSIUM CHLORIDE CRYS ER 20 MEQ PO TBCR
40.0000 meq | EXTENDED_RELEASE_TABLET | Freq: Once | ORAL | Status: AC
Start: 2023-04-21 — End: 2023-04-21
  Administered 2023-04-21: 40 meq via ORAL
  Filled 2023-04-21: qty 2

## 2023-04-21 MED ORDER — AMOXICILLIN-POT CLAVULANATE 875-125 MG PO TABS
1.0000 | ORAL_TABLET | Freq: Once | ORAL | Status: AC
  Administered 2023-04-21: 1 via ORAL
  Filled 2023-04-21: qty 1

## 2023-04-21 MED ORDER — ONDANSETRON 4 MG PO TBDP: 4.0000 mg | ORAL_TABLET | Freq: Three times a day (TID) | ORAL | 0 refills | Status: AC | PRN

## 2023-04-21 MED ORDER — SODIUM CHLORIDE 0.9 % IV BOLUS
1000.0000 mL | Freq: Once | INTRAVENOUS | Status: AC
  Administered 2023-04-21: 1000 mL via INTRAVENOUS

## 2023-04-21 MED ORDER — POTASSIUM CHLORIDE CRYS ER 20 MEQ PO TBCR: 40.0000 meq | EXTENDED_RELEASE_TABLET | Freq: Every day | ORAL | 0 refills | Status: AC

## 2023-04-21 MED ORDER — IOHEXOL 300 MG/ML  SOLN
100.0000 mL | Freq: Once | INTRAMUSCULAR | Status: AC | PRN
  Administered 2023-04-21: 100 mL via INTRAVENOUS

## 2023-04-21 NOTE — ED Notes (Signed)
Gave patient cup of ice water and crackers

## 2023-04-21 NOTE — ED Provider Notes (Signed)
[place] EMERGENCY DEPARTMENT AT [facility] Provider Note   CSN: [id number] Arrival date & time: 04/21/23  1002     History  Chief Complaint  Patient presents with   Abdominal Pain    Patrick Collier is a 82 y.o. male.  The history is provided by the patient, the spouse, a relative and medical records. No language interpreter was used.  Abdominal Pain Pain location:  RUQ Pain quality: aching   Pain radiates to:  Does not radiate Pain severity:  Severe Onset quality:  Gradual Duration:  2 days Timing:  Constant Progression:  Waxing and waning Chronicity:  New Context: previous surgery   Context: not trauma   Relieved by:  Nothing Worsened by:  Palpation Ineffective treatments:  None tried Associated symptoms: anorexia and fatigue   Associated symptoms: no chest pain, no chills, no constipation, no cough, no diarrhea, no dysuria, no fever, no nausea, no shortness of breath and no vomiting        Home Medications Prior to Admission medications   Medication Sig Start Date End Date Taking? Authorizing Provider  acetaminophen (TYLENOL) 500 MG tablet Take 1,000 mg by mouth daily as needed (pain).    [provider]  amoxicillin (AMOXIL) 500 MG tablet Take 500 mg by mouth as needed (PRIOR TO DENTAL APPOINTMENT). 08/03/22   [provider]  levothyroxine (SYNTHROID) 137 MCG tablet Take 137 mcg by mouth daily before breakfast. 07/23/22   [provider]      Allergies    Patient has no known allergies.    Review of Systems   Review of Systems  Constitutional:  Positive for fatigue. Negative for chills and fever.  HENT:  Negative for congestion.   Respiratory:  Negative for cough, chest tightness and shortness of breath.   Cardiovascular:  Negative for chest pain, palpitations and leg swelling.  Gastrointestinal:  Positive for abdominal distention, abdominal pain and anorexia. Negative for constipation, diarrhea, nausea and  vomiting.  Genitourinary:  Negative for dysuria, flank pain and frequency.  Musculoskeletal:  Negative for back pain, neck pain and neck stiffness.  Skin:  Negative for rash and wound.  Neurological:  Negative for dizziness, light-headedness and headaches.  Psychiatric/Behavioral:  Negative for agitation.   All other systems reviewed and are negative.   Physical Exam Updated Vital Signs BP 111/74   Pulse 65   Temp 98 F (36.7 C) (Oral)   Resp 16   Ht 5\' 7"  (1.702 m)   Wt 75.3 kg   SpO2 96%   BMI 26.00 kg/m  Physical Exam Vitals and nursing note reviewed.  Constitutional:      General: He is not in acute distress.    Appearance: He is well-developed. He is not ill-appearing, toxic-appearing or diaphoretic.  HENT:     Head: Normocephalic and atraumatic.     Mouth/Throat:     Mouth: Mucous membranes are moist.  Eyes:     Conjunctiva/sclera: Conjunctivae normal.  Cardiovascular:     Rate and Rhythm: Normal rate and regular rhythm.     Heart sounds: No murmur heard. Pulmonary:     Effort: Pulmonary effort is normal. No respiratory distress.     Breath sounds: Normal breath sounds.  Abdominal:     General: Bowel sounds are normal. There is distension.     Palpations: Abdomen is soft. There is hepatomegaly.     Tenderness: There is abdominal tenderness in the right upper quadrant and epigastric area. There is no right  CVA tenderness, left CVA tenderness, guarding or rebound.    Musculoskeletal:        General: No swelling.     Cervical back: Neck supple.  Skin:    General: Skin is warm and dry.     Capillary Refill: Capillary refill takes less than 2 seconds.  Neurological:     Mental Status: He is alert.  Psychiatric:        Mood and Affect: Mood normal.     ED Results / Procedures / Treatments   Labs (all labs ordered are listed, but only abnormal results are displayed) Labs Reviewed  CBC WITH DIFFERENTIAL/PLATELET - Abnormal; Notable for the following  components:      Result Value   RBC 3.75 (*)    Hemoglobin 12.2 (*)    HCT 35.8 (*)    Lymphs Abs 0.4 (*)    All other components within normal limits  COMPREHENSIVE METABOLIC PANEL - Abnormal; Notable for the following components:   Potassium 2.8 (*)    Glucose, Bld 173 (*)    Calcium 8.3 (*)    Albumin 2.9 (*)    All other components within normal limits  URINALYSIS, W/ REFLEX TO CULTURE (INFECTION SUSPECTED) - Abnormal; Notable for the following components:   Bacteria, UA RARE (*)    Crystals PRESENT (*)    All other components within normal limits  LACTIC ACID, PLASMA  LACTIC ACID, PLASMA  LIPASE, BLOOD  PROTIME-INR  AMMONIA    EKG None  Radiology CT ABDOMEN PELVIS W CONTRAST Result Date: 04/21/2023 CLINICAL DATA:  Abdominal pain, acute, nonlocalized Right-sided and right upper quadrant abdominal pain and swelling since yesterday. History of previous cholecystitis and history of biliary cancer with stenting. Rule out concerning changes. * Tracking Code: BO * EXAM: CT ABDOMEN AND PELVIS WITH CONTRAST TECHNIQUE: Multidetector CT imaging of the abdomen and pelvis was performed using the standard protocol following bolus administration of intravenous contrast. RADIATION DOSE REDUCTION: This exam was performed according to the departmental dose-optimization program which includes automated exposure control, adjustment of the mA and/or kV according to patient size and/or use of iterative reconstruction technique. CONTRAST:  OMNIPAQUE IOHEXOL 300 MG/ML  SOLN COMPARISON:  CT scan abdomen and pelvis from 12/31/2022 and PET-CT scan 02/12/2023. FINDINGS: Lower chest: There are patchy atelectatic changes in the visualized lung bases. No overt consolidation. No pleural effusion. The heart is normal in size. No pericardial effusion. There are coronary artery atherosclerotic calcifications, in keeping with coronary artery disease. Hepatobiliary: The liver is normal in size. Non-cirrhotic  configuration. Metallic stent noted in the extrahepatic bile duct. There is pneumobilia in the left hepatic lobe. The portal vein is patent. However, there is mild periportal edema. Contracted gallbladder. There is small amount of fluid between the gallbladder and liver measuring approximately 1.6 x 2.4 cm, new since the prior study. Pancreas: Redemonstration of an approximately 1.2 x 1.6 cm fluid attenuation structure partially exophytic arising from the pancreatic body/tail junction region, unchanged since the prior study. Otherwise unremarkable pancreas. Spleen: Within normal limits. No focal lesion. Adrenals/Urinary Tract: Adrenal glands are unremarkable. No suspicious renal mass. No hydronephrosis. There is a 1 mm nonobstructing calculus in the right kidney lower pole. No other renal or ureteric calculi. Unremarkable urinary bladder. Stomach/Bowel: There is a small sliding hiatal hernia. The appendix is unremarkable. There is mild focal circumferential thickening of the short length of ascending colon without proximal colonic dilation, favored to represent focal colitis most likely infective in etiology. There  is no associated abscess / collection or pneumoperitoneum. Vascular/Lymphatic: No ascites or pneumoperitoneum. No abdominal or pelvic lymphadenopathy, by size criteria. No aneurysmal dilation of the major abdominal arteries. There are mild peripheral atherosclerotic vascular calcifications of the aorta and its major branches. Reproductive: Normal size prostate. Symmetric seminal vesicles. Other: There are small fat containing umbilical and left inguinal hernias. The soft tissues and abdominal wall are otherwise unremarkable. Musculoskeletal: No suspicious osseous lesions. There are mild - moderate multilevel degenerative changes in the visualized spine. IMPRESSION: 1. There is mild focal circumferential thickening of the short length of ascending colon, favored to represent focal colitis most likely  infective in etiology. No associated abscess/collection or pneumoperitoneum. 2. Contracted gallbladder. There is small amount of non walled-off fluid adjacent to it which is of indeterminate etiology. This is new since the prior study. In view of recent ERCP, findings can be due to small subcapsular hematoma. 3. Redemonstration of metallic stent in the extrahepatic bile duct. Left hepatic lobe pneumobilia. No intrahepatic bile duct dilation. 4. Multiple other nonacute observations, as described above. Electronically Signed   By: Jules Schick M.D.   On: 04/21/2023 13:36   US Abdomen Limited RUQ (LIVER/GB) Result Date: 04/21/2023 CLINICAL DATA:  Abdominal pain.  Cholangiocarcinoma.  Biliary stent. EXAM: ULTRASOUND ABDOMEN LIMITED RIGHT UPPER QUADRANT COMPARISON:  None Available. FINDINGS: Gallbladder: Contracted and not well visualized.  No definite gallstones seen. Common bile duct: Diameter: 4 mm, within normal limits. Liver: No hepatic mass identified. Increased in course parenchymal echogenicity, consistent with diffuse hepatocellular disease. Portal vein is patent on color Doppler imaging with normal direction of blood flow towards the liver. Other: None. IMPRESSION: Diffuse hepatocellular disease. No hepatic mass identified. Contracted gallbladder.  No evidence of biliary ductal dilatation. Electronically Signed   By: Danae Orleans M.D.   On: 04/21/2023 12:06    Procedures Procedures    Medications Ordered in ED Medications  sodium chloride 0.9 % bolus 1,000 mL (0 mLs Intravenous Stopped 04/21/23 1225)  iohexol (OMNIPAQUE) 300 MG/ML solution 100 mL (100 mLs Intravenous Contrast Given 04/21/23 1232)  potassium chloride SA (KLOR-CON M) CR tablet 40 mEq (40 mEq Oral Given 04/21/23 1429)  amoxicillin-clavulanate (AUGMENTIN) 875-125 MG per tablet 1 tablet (1 tablet Oral Given 04/21/23 1434)    ED Course/ Medical Decision Making/ A&P                                 Medical Decision  Making Amount and/or Complexity of Data Reviewed Labs: ordered. Radiology: ordered.  Risk Prescription drug management.    KEYSON HARTZEL is a 82 y.o. male with a past medical history significant for dementia, hypothyroidism, and biliary cancer status post stenting with prior acute cholecystitis treated with IR drainage catheter status post removal who presents with right sided abdominal pain        2:08 PM Patient reports symptoms have complete resolved.  No further pain.  His workup returned show he does have a focal area of colitis on the ascending colon likely splinting his pain.  It also showed the possible postprocedural small hematoma to the gallbladder but on reassessment he is not having any tenderness now.  He is not anemic and labs only revealed low potassium of much concern.  Will give oral potassium.  We had a shared decision-making conversation offering admission for some dehydration and this colitis but patient would rather try going home.  Will give a  dose of Augmentin, give him potassium, and p.o. challenge but if he passes, will discharge home with treatment for potassium, antibiotics, nausea medicine, and have him follow-up with his team this week.  Anticipate discharge after p.o. challenge.  Patient passed p.o. challenge and is feeling better.  Will discharge with antibiotics, nausea medicine, and potassium.  Patient understands return precautions and follow-up instructions and was discharged in good condition.        Final Clinical Impression(s) / ED Diagnoses Final diagnoses:  Colitis  Diarrhea of infectious origin    Rx / DC Orders ED Discharge Orders          Ordered    amoxicillin-clavulanate (AUGMENTIN) 875-125 MG tablet  Every 12 hours        04/21/23 1502    ondansetron (ZOFRAN-ODT) 4 MG disintegrating tablet  Every 8 hours PRN        04/21/23 1502    potassium chloride SA (KLOR-CON M) 20 MEQ tablet  Daily        04/21/23 1502             Clinical Impression: 1. Colitis   2. Diarrhea of infectious origin     Disposition: Discharge  Condition: Good  I have discussed the results, Dx and Tx plan with the pt(& family if present). He/she/they expressed understanding and agree(s) with the plan. Discharge instructions discussed at great length. Strict return precautions discussed and pt &/or family have verbalized understanding of the instructions. No further questions at time of discharge.    New Prescriptions   AMOXICILLIN-CLAVULANATE (AUGMENTIN) 875-125 MG TABLET    Take 1 tablet by mouth every 12 (twelve) hours.   ONDANSETRON (ZOFRAN-ODT) 4 MG DISINTEGRATING TABLET    Take 1 tablet (4 mg total) by mouth every 8 (eight) hours as needed for nausea or vomiting.   POTASSIUM CHLORIDE SA (KLOR-CON M) 20 MEQ TABLET    Take 2 tablets (40 mEq total) by mouth daily for 3 days.    Follow Up: Farris Has, MD [address] [place] [address] [phone]     [facility] Emergency Department at [facility] [address] [place] [address] [phone]       Arbor Cohen, Canary Brim, MD 04/21/23 [phone]

## 2023-04-21 NOTE — ED Triage Notes (Signed)
Patient has bile duct cancer. Reports right sided abdominal pain x a couple days. Today woke up with right side abdominal swelling. Denies nausea, vomiting, and fevers.

## 2023-04-21 NOTE — Discharge Instructions (Signed)
Your history, exam, workup today did reveal evidence of colitis in the ascending colon likely causing your discomfort and symptoms.  We feel you are safe for discharge home given your well appearance and reassuring vital signs on reassessment.  You did have low potassium so please take the potassium for the next Avril days and take the antibiotics for the infection.  Please use the nausea medicine to increase your hydration and rest.  Please call and follow-up with your primary doctor.  If any symptoms change or worsen acutely, please return to the nearest emergency department.

## 2023-04-22 ENCOUNTER — Other Ambulatory Visit: Payer: Self-pay

## 2023-04-22 ENCOUNTER — Telehealth: Payer: Self-pay

## 2023-04-22 NOTE — Telephone Encounter (Signed)
Spoke with pt's wife via telephone regarding pt's recent visit to the ED on 04/21/2023.  Pt was d/c with dx of Colitis and hypokalemia (see ED notes).  Pt's wife stated they were instructed to contact Dr. Latanya Maudlin office for f/u appt for lab redraw and OV.  Pt was d/c with home prescription for amoxicillin and Potassium Chloride.  Stated this nurse will make Dr. Mosetta Putt and her Team aware of the pt's recent ED Visit.

## 2023-04-23 ENCOUNTER — Telehealth: Payer: Self-pay | Admitting: Hematology

## 2023-04-26 ENCOUNTER — Inpatient Hospital Stay: Payer: Medicare Other | Attending: Physician Assistant

## 2023-04-26 ENCOUNTER — Inpatient Hospital Stay: Payer: Medicare Other | Attending: Physician Assistant | Admitting: Hematology

## 2023-04-26 VITALS — BP 104/75 | HR 60 | Temp 97.9°F | Resp 15 | Wt 168.4 lb

## 2023-04-26 DIAGNOSIS — Z9221 Personal history of antineoplastic chemotherapy: Secondary | ICD-10-CM | POA: Insufficient documentation

## 2023-04-26 DIAGNOSIS — Z923 Personal history of irradiation: Secondary | ICD-10-CM | POA: Insufficient documentation

## 2023-04-26 DIAGNOSIS — C221 Intrahepatic bile duct carcinoma: Secondary | ICD-10-CM

## 2023-04-26 DIAGNOSIS — K529 Noninfective gastroenteritis and colitis, unspecified: Secondary | ICD-10-CM | POA: Insufficient documentation

## 2023-04-26 DIAGNOSIS — F0390 Unspecified dementia without behavioral disturbance: Secondary | ICD-10-CM | POA: Insufficient documentation

## 2023-04-26 DIAGNOSIS — R630 Anorexia: Secondary | ICD-10-CM | POA: Insufficient documentation

## 2023-04-26 DIAGNOSIS — R634 Abnormal weight loss: Secondary | ICD-10-CM | POA: Insufficient documentation

## 2023-04-26 LAB — CBC WITH DIFFERENTIAL/PLATELET
Abs Immature Granulocytes: 0.01 10*3/uL (ref 0.00–0.07)
Basophils Absolute: 0 10*3/uL (ref 0.0–0.1)
Basophils Relative: 0 %
Eosinophils Absolute: 0.1 10*3/uL (ref 0.0–0.5)
Eosinophils Relative: 2 %
HCT: 41 % (ref 39.0–52.0)
Hemoglobin: 13.8 g/dL (ref 13.0–17.0)
Immature Granulocytes: 0 %
Lymphocytes Relative: 9 %
Lymphs Abs: 0.5 10*3/uL — ABNORMAL LOW (ref 0.7–4.0)
MCH: 31.9 pg (ref 26.0–34.0)
MCHC: 33.7 g/dL (ref 30.0–36.0)
MCV: 94.7 fL (ref 80.0–100.0)
Monocytes Absolute: 0.6 10*3/uL (ref 0.1–1.0)
Monocytes Relative: 10 %
Neutro Abs: 4.7 10*3/uL (ref 1.7–7.7)
Neutrophils Relative %: 79 %
Platelets: 330 10*3/uL (ref 150–400)
RBC: 4.33 MIL/uL (ref 4.22–5.81)
RDW: 12.1 % (ref 11.5–15.5)
WBC: 6 10*3/uL (ref 4.0–10.5)
nRBC: 0 % (ref 0.0–0.2)

## 2023-04-26 LAB — COMPREHENSIVE METABOLIC PANEL
ALT: 20 U/L (ref 0–44)
AST: 24 U/L (ref 15–41)
Albumin: 3.5 g/dL (ref 3.5–5.0)
Alkaline Phosphatase: 143 U/L — ABNORMAL HIGH (ref 38–126)
Anion gap: 6 (ref 5–15)
BUN: 10 mg/dL (ref 8–23)
CO2: 28 mmol/L (ref 22–32)
Calcium: 9.1 mg/dL (ref 8.9–10.3)
Chloride: 101 mmol/L (ref 98–111)
Creatinine, Ser: 1 mg/dL (ref 0.61–1.24)
GFR, Estimated: >60 mL/min (ref >60)
Glucose, Bld: 174 mg/dL — ABNORMAL HIGH (ref 70–99)
Potassium: 4.4 mmol/L (ref 3.5–5.1)
Sodium: 135 mmol/L (ref 135–145)
Total Bilirubin: 0.6 mg/dL (ref 0.0–1.2)
Total Protein: 7.4 g/dL (ref 6.5–8.1)

## 2023-04-26 NOTE — Assessment & Plan Note (Signed)
 [id number] -presented with elevated LFT's, jaundice, 10-15 lb weight loss. CT AP 10/06/21 showed biliary obstruction, he was referred to ED. -baseline CA 19-9 on 10/06/21 elevated to 129. -s/p ERCP 10/11/21 inpatient showed a mass in common bile duct, cytology not diagnostic showing only suspicious atypical cells. S/p stent placement  -staging chest CT 11/03/21 was negative. -he met Dr. Freida Busman on 11/08/21 and was offered surgery, but pt declined. -repeat ERCP on 11/21/21 for stent change, path and cytology were again non-diagnostic but suspicious. -he received radiation 12/04/21 - 01/16/22. He tried concurrent Xeloda for a week but tolerated poorly with nausea, weight loss, and rash to his hands and face, discontinued 8/22.  -He developed cholecystitis during treatment which was managed with percutaneous cholecystostomy  -restaging CT 07/02/2022 showed Decompression of the gallbladder with resolution of adjacent fluid collection since previous imaging, no evidence of cancer progression  -restaging CT 9/9 showed stable soft tissue in porta hepatis, no evidence of cancer progression.  His tumor marker CA 19.9 has been rising lately -CT chest 01/23/2023 was negative, but PET 02/12/2023 showed hypermetabolic activity along the mid and distal aspect of the biliary stent. He underwent  -He underwent a ERCP and stent exchange on February 27, 2023, cytology showed atypical cells, suspicious for malignancy. -NGS attempted but insufficient tumor tissue for the test -After lengthy discussion, patient and his wife decided to continue supportive care alone.

## 2023-04-26 NOTE — Progress Notes (Signed)
 [facility]   [phone] [phone]   Clinic Follow up Note   Patient Care Team: Kip Righter, MD as PCP - General (Family Medicine) Pietro Redell RAMAN, MD as PCP - Cardiology (Cardiology) Pietro Redell RAMAN, MD as Consulting Physician (Cardiology) Lanny Callander, MD as Consulting Physician (Hematology and Oncology)  Date of Service:  04/26/2023  CHIEF COMPLAINT: f/u of recent ED visit  CURRENT THERAPY:  Observation and supportive care  Oncology History   Cholangiocarcinoma (HCC) cTxN0M0 -presented with elevated LFT's, jaundice, 10-15 lb weight loss. CT AP 10/06/21 showed biliary obstruction, he was referred to ED. -baseline CA 19-9 on 10/06/21 elevated to 129. -s/p ERCP 10/11/21 inpatient showed a mass in common bile duct, cytology not diagnostic showing only suspicious atypical cells. S/p stent placement  -staging chest CT 11/03/21 was negative. -he met Dr. Dasie on 11/08/21 and was offered surgery, but pt declined. -repeat ERCP on 11/21/21 for stent change, path and cytology were again non-diagnostic but suspicious. -he received radiation 12/04/21 - 01/16/22. He tried concurrent Xeloda  for a week but tolerated poorly with nausea, weight loss, and rash to his hands and face, discontinued 8/22.  -He developed cholecystitis during treatment which was managed with percutaneous cholecystostomy  -restaging CT 07/02/2022 showed Decompression of the gallbladder with resolution of adjacent fluid collection since previous imaging, no evidence of cancer progression  -restaging CT 9/9 showed stable soft tissue in porta hepatis, no evidence of cancer progression.  His tumor marker CA 19.9 has been rising lately -CT chest 01/23/2023 was negative, but PET 02/12/2023 showed hypermetabolic activity along the mid and distal aspect of the biliary stent. He underwent  -He underwent a ERCP and stent exchange on February 27, 2023, cytology showed atypical cells, suspicious for  malignancy. -NGS attempted but insufficient tumor tissue for the test -After lengthy discussion, patient and his wife decided to continue supportive care alone.    Assessment and Plan    Cholangiocarcinoma Follow-up for cholangiocarcinoma. No jaundice or significant tenderness. CT scan shows no new findings. Decision to not pursue chemotherapy remains appropriate. Discussed potential future pain due to bile duct cancer. Patient prefers symptom monitoring. - Monitor symptoms - Follow-up at the end of February  Colitis Recent colitis diagnosis with right-sided abdominal pain, swelling, and tenderness. Symptoms improved with antibiotics. No diarrhea. CT scan showed colon wall inflammation and thickening. Differential includes infection and inflammation, no clear link to prior radiation therapy. Bland diet recommended to reduce colon irritation. - Complete antibiotics - Resume normal diet 3-5 days post-antibiotics - Consider GI or primary care follow-up if symptoms recur  Nutritional Deficiency Poor appetite and weight loss. On a bland diet due to colitis. Consuming Boost nutritional drinks daily. No significant improvement in appetite or energy levels. Discussed increasing intake of Boost and high protein, high carbohydrate foods. Caregiver managing dietary needs. - Increase Boost intake to multiple times a day - Encourage high protein, high carbohydrate diet - Consider plant-based protein shakes  Memory Loss Significant memory loss requiring assistance with daily activities. No recent falls. Caregiver provides primary support and supervision. - Encourage mental stimulation activities - Monitor for further cognitive decline  General Health Maintenance Discussed dietary recommendations and activity levels. Emphasized regular physical activity to prevent muscle loss. - Encourage regular physical activity - Consider dietician consultation if needed  Plan -Lab and recent ED visit note,  including CT scan reviewed with patient and his wife -He will complete course of antibiotics -Continue observation and supportive care - Follow-up  at the end of February as scheduled - Call if new symptoms or concerns arise.         SUMMARY OF ONCOLOGIC HISTORY: Oncology History Overview Note   Cancer Staging  Cholangiocarcinoma [facility]) Staging form: Distal Bile Duct, AJCC 8th Edition - Clinical stage from 11/21/2021: Stage Unknown (cTX, cN0, cM0) - Signed by Lanny Callander, MD on 12/12/2021 Total positive nodes: 0     Cholangiocarcinoma (HCC)  10/07/2021 Imaging   MR ABDOMEN MRCP W WO CONTAST  IMPRESSION: 1. Severe biliary ductal dilation of both the intra and extrahepatic bile ducts with abrupt cutoff seen at the distal common bile duct. Findings are concerning for stricture or obstructing tumor. No obstructing mass is seen, although motion artifact on contrast-enhanced imaging limits evaluation. Recommend correlation with ERCP. 2. Small cystic lesions of the pancreatic tail, largest measures 10 mm. Recommend follow up pre and post contrast MRI/MRCP or pancreatic protocol CT in 2 years. This recommendation follows ACR consensus guidelines: Management of Incidental Pancreatic Cysts: A White Paper of the ACR Incidental Findings Committee. J Am Coll Radiol 2017;14:911-923. 3. Trace perihepatic ascites and trace bilateral pleural effusions.   10/11/2021 Procedure   ERCP-Dr. Aneita  Impression:  -I single localized malignant appearing severe biliary stricture was found in the lower third of the main bile duct. -The proximal biliary tree was dilated, secondary to stricture -Biliary sphincterotomy was performed -Cells for cytology obtained in the lower third of the main duct -One plastic stent was placed into the common bile duct.     10/11/2021 Pathology Results   CYTOLOGY - NON PAP  CASE: [id number]  PATIENT: VICENTA CRAW  Non-Gynecological Cytology Report   FINAL  MICROSCOPIC DIAGNOSIS:  A. COMMON BILE DUCT, LESION, FINE NEEDLE ASPIRATION:  - No malignant cells seen  -Scant cellularity; benign/reactive ductal cells and ductal epithelium  with acute inflammatory cells   B. COMMON BILE DUCT, STRICTURE, BRUSHING:  - Atypical cells suspicious for tumor present    10/26/2021 Initial Diagnosis   Cholangiocarcinoma (HCC)    Procedure   Upper EUS-Dr. Burnette  Impression:  - There was no sign of significant pathology in the ampulla. - A mass was found in the common bile duct. Fine needle aspiration performed. - There was no sign of significant pathology in the pancreatic head, genu of the pancreas and pancreatic body. - A few abnormal lymph nodes were visualized in the peripancreatic region and porta hepatis region. - There was dilation in the middle third of the main bile duct, in the upper third of the main bile duct and in the common hepatic duct which measured up to 15 mm. - Small perihepatic ascites. - Overall constellation findings most consistent mid common bile duct cholangiocarcinoma.   11/21/2021 Cancer Staging   Staging form: Distal Bile Duct, AJCC 8th Edition - Clinical stage from 11/21/2021: Stage Unknown (cTX, cN0, cM0) - Signed by Lanny Callander, MD on 12/12/2021 Total positive nodes: 0   07/02/2022 Imaging    IMPRESSION: 1. Decompression of the gallbladder with resolution of adjacent fluid collection since previous imaging. 2. Mild stranding about the porta hepatis and stranding/soft tissue density surrounding replaced RIGHT hepatic artery which arises from the SMA. 3. Under distension versus thickening of the hepatic flexure of the colon adjacent to the hepatic duodenal ligament just inferior to the gallbladder. No substantial surrounding stranding in this area. Correlate with any symptoms of colitis, this is favored to represent decompression versus mild secondary changes adjacent of the colon in the setting  of previous inflammatory  change. Could consider short interval follow-up or Cologuard correlation. Could also consider direct visualization as warranted for further assessment as a more aggressive approach. 4. Stable cystic lesion in the body-tail of the pancreas. This may represent a small pseudocyst or indolent cystic neoplasm. Attention on follow-up. 5. Aortic atherosclerosis. 6. Fat containing LEFT inguinal and umbilical hernias are small. 7. Signs of aortic valve and mitral valve calcification. 8. Colonic diverticulosis.   12/31/2022 Imaging   CT Abdomen and Pelvis with Contrast  IMPRESSION: 1. Unchanged mild soft tissue stranding about the porta hepatis. No evidence of progressive disease in the abdomen or pelvis. 2. Similar intrahepatic biliary ductal dilation and pneumobilia with common bile duct stent in place. 3. Unchanged cystic lesion of the tail of the pancreas measuring 13 mm. Recommend attention on follow-up. 4. Mild aortic Atherosclerosis (ICD10-I70.0).       Discussed the use of AI scribe software for clinical note transcription with the patient, who gave verbal consent to proceed.  History of Present Illness   The patient, with a history of cholangiocarcinoma, presents for follow-up after a recent emergency room visit due to right-sided abdominal swelling and tenderness. The emergency room doctor diagnosed him with colitis and prescribed antibiotics. The patient's caregiver reports that the patient's symptoms have improved since starting the antibiotics, with less abdominal gurgling and no diarrhea. However, the patient still experiences intermittent abdominal pain, which he describes as being more in the middle of his abdomen. The patient also reports fatigue and a lack of appetite, which has led to weight loss. The patient's caregiver has been trying to maintain a bland diet for the patient and has introduced a liquid boost to his diet. The patient also has memory loss, which has been noted by  his caregiver.         All other systems were reviewed with the patient and are negative.  MEDICAL HISTORY:  Past Medical History:  Diagnosis Date   Arthritis    Back pain    Cancer (HCC)    Dementia (HCC)    Heart murmur    History of kidney stones    Hypothyroidism    ICH (intracerebral hemorrhage) (HCC)     SURGICAL HISTORY: Past Surgical History:  Procedure Laterality Date   BACK SURGERY     BILIARY BRUSHING  10/11/2021   Procedure: BILIARY BRUSHING;  Surgeon: Aneita Gwendlyn DASEN, MD;  Location: [facility];  Service: Gastroenterology;;   BILIARY BRUSHING  11/21/2021   Procedure: BILIARY BRUSHING;  Surgeon: Rosalie Kitchens, MD;  Location: [facility];  Service: Gastroenterology;;   BILIARY BRUSHING  02/27/2023   Procedure: BILIARY BRUSHING;  Surgeon: Rosalie Kitchens, MD;  Location: [facility];  Service: Gastroenterology;;   BILIARY STENT PLACEMENT N/A 10/11/2021   Procedure: BILIARY STENT PLACEMENT;  Surgeon: Aneita Gwendlyn DASEN, MD;  Location: [facility];  Service: Gastroenterology;  Laterality: N/A;   BILIARY STENT PLACEMENT N/A 11/21/2021   Procedure: BILIARY STENT PLACEMENT;  Surgeon: Rosalie Kitchens, MD;  Location: [facility];  Service: Gastroenterology;  Laterality: N/A;   BILIARY STENT PLACEMENT N/A 02/27/2023   Procedure: BILIARY STENT PLACEMENT;  Surgeon: Rosalie Kitchens, MD;  Location: [facility];  Service: Gastroenterology;  Laterality: N/A;   BIOPSY  11/21/2021   Procedure: BIOPSY;  Surgeon: Rosalie Kitchens, MD;  Location: [facility];  Service: Gastroenterology;;   BIOPSY  02/27/2023   Procedure: BIOPSY;  Surgeon: Rosalie Kitchens, MD;  Location: [facility];  Service: Gastroenterology;;   ERCP N/A 10/11/2021  Procedure: ENDOSCOPIC RETROGRADE CHOLANGIOPANCREATOGRAPHY (ERCP);  Surgeon: Aneita Gwendlyn DASEN, MD;  Location: [facility];  Service: Gastroenterology;  Laterality: N/A;   ERCP N/A 11/21/2021   Procedure: ENDOSCOPIC RETROGRADE CHOLANGIOPANCREATOGRAPHY (ERCP);  Surgeon: Rosalie Kitchens,  MD;  Location: [facility];  Service: Gastroenterology;  Laterality: N/A;  with Spyglass   ERCP Bilateral 02/27/2023   Procedure: ENDOSCOPIC RETROGRADE CHOLANGIOPANCREATOGRAPHY (ERCP);  Surgeon: Rosalie Kitchens, MD;  Location: [facility];  Service: Gastroenterology;  Laterality: Bilateral;   ESOPHAGOGASTRODUODENOSCOPY (EGD) WITH PROPOFOL  N/A 10/11/2021   Procedure: ESOPHAGOGASTRODUODENOSCOPY (EGD) WITH PROPOFOL ;  Surgeon: Burnette Fallow, MD;  Location: [facility];  Service: Gastroenterology;  Laterality: N/A;   EUS N/A 10/11/2021   Procedure: UPPER ENDOSCOPIC ULTRASOUND (EUS) LINEAR;  Surgeon: Burnette Fallow, MD;  Location: [facility];  Service: Gastroenterology;  Laterality: N/A;   FINE NEEDLE ASPIRATION N/A 10/11/2021   Procedure: FINE NEEDLE ASPIRATION (FNA) LINEAR;  Surgeon: Burnette Fallow, MD;  Location: [facility];  Service: Gastroenterology;  Laterality: N/A;   INGUINAL HERNIA REPAIR     IR EXCHANGE BILIARY DRAIN  01/22/2022   IR EXCHANGE BILIARY DRAIN  01/26/2022   IR EXCHANGE BILIARY DRAIN  03/05/2022   IR PERC CHOLECYSTOSTOMY  01/02/2022   KNEE ARTHROPLASTY Right 05/16/2017   Procedure: RIGHT TOTAL KNEE ARTHROPLASTY WITH COMPUTER NAVIGATION;  Surgeon: Fidel Rogue, MD;  Location: [facility];  Service: Orthopedics;  Laterality: Right;  Needs RNFA   NECK SURGERY     REMOVAL OF STONES  02/27/2023   Procedure: REMOVAL OF SLUDGE;  Surgeon: Rosalie Kitchens, MD;  Location: [facility];  Service: Gastroenterology;;   ANNETT  10/11/2021   Procedure: SPHINCTEROTOMY;  Surgeon: Aneita Gwendlyn DASEN, MD;  Location: [facility];  Service: Gastroenterology;;   ANNETT  11/21/2021   Procedure: ANNETT;  Surgeon: Rosalie Kitchens, MD;  Location: [facility];  Service: Gastroenterology;;   [facility] CHOLANGIOSCOPY N/A 11/21/2021   Procedure: DEBHOJDD CHOLANGIOSCOPY;  Surgeon: Rosalie Kitchens, MD;  Location: [facility];  Service: Gastroenterology;  Laterality: N/A;   STENT REMOVAL  11/21/2021    Procedure: STENT REMOVAL;  Surgeon: Rosalie Kitchens, MD;  Location: [facility];  Service: Gastroenterology;;   TONSILLECTOMY      I have reviewed the social history and family history with the patient and they are unchanged from previous note.  ALLERGIES:  has no known allergies.  MEDICATIONS:  Current Outpatient Medications  Medication Sig Dispense Refill   acetaminophen  (TYLENOL ) 500 MG tablet Take 1,000 mg by mouth daily as needed (pain).     amoxicillin  (AMOXIL ) 500 MG tablet Take 500 mg by mouth as needed (PRIOR TO DENTAL APPOINTMENT).     amoxicillin -clavulanate (AUGMENTIN ) 875-125 MG tablet Take 1 tablet by mouth every 12 (twelve) hours. 14 tablet 0   levothyroxine (SYNTHROID) 137 MCG tablet Take 137 mcg by mouth daily before breakfast.     ondansetron  (ZOFRAN -ODT) 4 MG disintegrating tablet Take 1 tablet (4 mg total) by mouth every 8 (eight) hours as needed for nausea or vomiting. 20 tablet 0   potassium chloride  SA (KLOR-CON  M) 20 MEQ tablet Take 2 tablets (40 mEq total) by mouth daily for 3 days. 6 tablet 0   No current facility-administered medications for this visit.    PHYSICAL EXAMINATION: ECOG PERFORMANCE STATUS: 2 - Symptomatic, <50% confined to bed  Vitals:   04/26/23 1330  BP: 104/75  Pulse: 60  Resp: 15  Temp: 97.9 F (36.6 C)  SpO2: 98%   Wt Readings from Last 3 Encounters:  04/26/23 168 lb 6.4 oz (76.4 kg)  04/21/23 166 lb (75.3 kg)  04/04/23 172 lb 11.2 oz (78.3 kg)     GENERAL:alert, no distress and comfortable SKIN: skin color, texture, turgor are normal, no rashes or significant lesions EYES: normal, Conjunctiva are pink and non-injected, sclera clear NECK: supple, thyroid  normal size, non-tender, without nodularity LYMPH:  no palpable lymphadenopathy in the cervical, axillary  LUNGS: clear to auscultation and percussion with normal breathing effort HEART: regular rate & rhythm and no murmurs and no lower extremity edema ABDOMEN:abdomen soft,  non-tender and normal bowel sounds, no hepatomegaly tenderness Musculoskeletal:no cyanosis of digits and no clubbing  NEURO: alert & oriented x 3 with fluent speech, no focal motor/sensory deficits  LABORATORY DATA:  I have reviewed the data as listed    Latest Ref Rng & Units 04/26/2023    1:11 PM 04/21/2023   10:48 AM 04/04/2023    2:17 PM  CBC  WBC 4.0 - 10.5 K/uL 6.0  5.1  4.6   Hemoglobin 13.0 - 17.0 g/dL 86.1  87.7  86.2   Hematocrit 39.0 - 52.0 % 41.0  35.8  40.6   Platelets 150 - 400 K/uL 330  235  231         Latest Ref Rng & Units 04/26/2023    1:11 PM 04/21/2023   10:48 AM 04/04/2023    2:17 PM  CMP  Glucose 70 - 99 mg/dL 825  826  878   BUN 8 - 23 mg/dL 10  9  11    Creatinine 0.61 - 1.24 mg/dL 8.99  9.07  9.03   Sodium 135 - 145 mmol/L 135  135  139   Potassium 3.5 - 5.1 mmol/L 4.4  2.8  4.2   Chloride 98 - 111 mmol/L 101  102  103   CO2 22 - 32 mmol/L 28  25  32   Calcium 8.9 - 10.3 mg/dL 9.1  8.3  9.2   Total Protein 6.5 - 8.1 g/dL 7.4  6.8  7.2   Total Bilirubin 0.0 - 1.2 mg/dL 0.6  0.9  0.7   Alkaline Phos 38 - 126 U/L 143  123  147   AST 15 - 41 U/L 24  29  29    ALT 0 - 44 U/L 20  23  27        RADIOGRAPHIC STUDIES: I have personally reviewed the radiological images as listed and agreed with the findings in the report. No results found.    No orders of the defined types were placed in this encounter.  All questions were answered. The patient knows to call the clinic with any problems, questions or concerns. No barriers to learning was detected. The total time spent in the appointment was 25 minutes.     Onita Mattock, MD 04/26/2023

## 2023-04-27 LAB — CANCER ANTIGEN 19-9: CA 19-9: 625 U/mL — ABNORMAL HIGH (ref 0–35)

## 2023-05-07 ENCOUNTER — Other Ambulatory Visit (HOSPITAL_COMMUNITY): Payer: Medicare Other

## 2023-06-04 ENCOUNTER — Telehealth (HOSPITAL_COMMUNITY): Payer: Self-pay | Admitting: Cardiology

## 2023-06-04 NOTE — Telephone Encounter (Signed)
06/04/23 Wife called and cancelled echocardiogram due to pt has cancer and not able to make it. Will call back when ready to have. Order will be removed from the echo WQ. Thank you.

## 2023-06-10 DIAGNOSIS — E785 Hyperlipidemia, unspecified: Secondary | ICD-10-CM | POA: Diagnosis not present

## 2023-06-10 DIAGNOSIS — D7589 Other specified diseases of blood and blood-forming organs: Secondary | ICD-10-CM | POA: Diagnosis not present

## 2023-06-10 DIAGNOSIS — R011 Cardiac murmur, unspecified: Secondary | ICD-10-CM | POA: Diagnosis not present

## 2023-06-10 DIAGNOSIS — R413 Other amnesia: Secondary | ICD-10-CM | POA: Diagnosis not present

## 2023-06-10 DIAGNOSIS — C221 Intrahepatic bile duct carcinoma: Secondary | ICD-10-CM | POA: Diagnosis not present

## 2023-06-10 DIAGNOSIS — Z0000 Encounter for general adult medical examination without abnormal findings: Secondary | ICD-10-CM | POA: Diagnosis not present

## 2023-06-10 DIAGNOSIS — R7309 Other abnormal glucose: Secondary | ICD-10-CM | POA: Diagnosis not present

## 2023-06-10 DIAGNOSIS — E039 Hypothyroidism, unspecified: Secondary | ICD-10-CM | POA: Diagnosis not present

## 2023-06-12 ENCOUNTER — Other Ambulatory Visit (HOSPITAL_COMMUNITY): Payer: Medicare Other

## 2023-06-18 ENCOUNTER — Other Ambulatory Visit: Payer: Medicare Other

## 2023-06-18 ENCOUNTER — Ambulatory Visit: Payer: Medicare Other | Admitting: Hematology

## 2023-06-18 ENCOUNTER — Other Ambulatory Visit: Payer: Self-pay

## 2023-06-20 ENCOUNTER — Telehealth: Payer: Self-pay

## 2023-06-20 ENCOUNTER — Other Ambulatory Visit: Payer: Self-pay

## 2023-06-20 DIAGNOSIS — C221 Intrahepatic bile duct carcinoma: Secondary | ICD-10-CM

## 2023-06-20 NOTE — Telephone Encounter (Signed)
 Pt's wife called and stated they were referred to hospice by Dr. Kateri Plummer.  Pt's wife stated [facility] came to register pt for Hospice.  Pt's wife stated after speaking with [facility], they would like to do hospice with another company.  Pt's wife requested if pt could be referred to [facility].  Stated this nurse will make referral and make Dr. Mosetta Putt aware of their decision to go with [facility].

## 2023-06-21 DIAGNOSIS — Z8719 Personal history of other diseases of the digestive system: Secondary | ICD-10-CM | POA: Diagnosis not present

## 2023-06-21 DIAGNOSIS — D696 Thrombocytopenia, unspecified: Secondary | ICD-10-CM | POA: Diagnosis not present

## 2023-06-21 DIAGNOSIS — Z87891 Personal history of nicotine dependence: Secondary | ICD-10-CM | POA: Diagnosis not present

## 2023-06-21 DIAGNOSIS — I350 Nonrheumatic aortic (valve) stenosis: Secondary | ICD-10-CM | POA: Diagnosis not present

## 2023-06-21 DIAGNOSIS — E039 Hypothyroidism, unspecified: Secondary | ICD-10-CM | POA: Diagnosis not present

## 2023-06-21 DIAGNOSIS — E440 Moderate protein-calorie malnutrition: Secondary | ICD-10-CM | POA: Diagnosis not present

## 2023-06-21 DIAGNOSIS — C221 Intrahepatic bile duct carcinoma: Secondary | ICD-10-CM | POA: Diagnosis not present

## 2023-06-21 DIAGNOSIS — M1990 Unspecified osteoarthritis, unspecified site: Secondary | ICD-10-CM | POA: Diagnosis not present

## 2023-06-21 DIAGNOSIS — F0390 Unspecified dementia without behavioral disturbance: Secondary | ICD-10-CM | POA: Diagnosis not present

## 2023-06-22 DIAGNOSIS — M1990 Unspecified osteoarthritis, unspecified site: Secondary | ICD-10-CM | POA: Diagnosis not present

## 2023-06-22 DIAGNOSIS — D696 Thrombocytopenia, unspecified: Secondary | ICD-10-CM | POA: Diagnosis not present

## 2023-06-22 DIAGNOSIS — C221 Intrahepatic bile duct carcinoma: Secondary | ICD-10-CM | POA: Diagnosis not present

## 2023-06-22 DIAGNOSIS — Z8719 Personal history of other diseases of the digestive system: Secondary | ICD-10-CM | POA: Diagnosis not present

## 2023-06-22 DIAGNOSIS — E440 Moderate protein-calorie malnutrition: Secondary | ICD-10-CM | POA: Diagnosis not present

## 2023-06-22 DIAGNOSIS — F0390 Unspecified dementia without behavioral disturbance: Secondary | ICD-10-CM | POA: Diagnosis not present

## 2023-06-22 DIAGNOSIS — I350 Nonrheumatic aortic (valve) stenosis: Secondary | ICD-10-CM | POA: Diagnosis not present

## 2023-06-22 DIAGNOSIS — E039 Hypothyroidism, unspecified: Secondary | ICD-10-CM | POA: Diagnosis not present

## 2023-06-22 DIAGNOSIS — Z87891 Personal history of nicotine dependence: Secondary | ICD-10-CM | POA: Diagnosis not present

## 2023-06-24 DIAGNOSIS — C221 Intrahepatic bile duct carcinoma: Secondary | ICD-10-CM | POA: Diagnosis not present

## 2023-06-24 DIAGNOSIS — Z87891 Personal history of nicotine dependence: Secondary | ICD-10-CM | POA: Diagnosis not present

## 2023-06-24 DIAGNOSIS — F0390 Unspecified dementia without behavioral disturbance: Secondary | ICD-10-CM | POA: Diagnosis not present

## 2023-06-24 DIAGNOSIS — E440 Moderate protein-calorie malnutrition: Secondary | ICD-10-CM | POA: Diagnosis not present

## 2023-06-24 DIAGNOSIS — Z8719 Personal history of other diseases of the digestive system: Secondary | ICD-10-CM | POA: Diagnosis not present

## 2023-06-24 DIAGNOSIS — D696 Thrombocytopenia, unspecified: Secondary | ICD-10-CM | POA: Diagnosis not present

## 2023-07-04 DIAGNOSIS — F0390 Unspecified dementia without behavioral disturbance: Secondary | ICD-10-CM | POA: Diagnosis not present

## 2023-07-04 DIAGNOSIS — D696 Thrombocytopenia, unspecified: Secondary | ICD-10-CM | POA: Diagnosis not present

## 2023-07-04 DIAGNOSIS — Z8719 Personal history of other diseases of the digestive system: Secondary | ICD-10-CM | POA: Diagnosis not present

## 2023-07-04 DIAGNOSIS — C221 Intrahepatic bile duct carcinoma: Secondary | ICD-10-CM | POA: Diagnosis not present

## 2023-07-04 DIAGNOSIS — Z87891 Personal history of nicotine dependence: Secondary | ICD-10-CM | POA: Diagnosis not present

## 2023-07-04 DIAGNOSIS — E440 Moderate protein-calorie malnutrition: Secondary | ICD-10-CM | POA: Diagnosis not present

## 2023-07-12 DIAGNOSIS — Z87891 Personal history of nicotine dependence: Secondary | ICD-10-CM | POA: Diagnosis not present

## 2023-07-12 DIAGNOSIS — C221 Intrahepatic bile duct carcinoma: Secondary | ICD-10-CM | POA: Diagnosis not present

## 2023-07-12 DIAGNOSIS — Z8719 Personal history of other diseases of the digestive system: Secondary | ICD-10-CM | POA: Diagnosis not present

## 2023-07-12 DIAGNOSIS — F0390 Unspecified dementia without behavioral disturbance: Secondary | ICD-10-CM | POA: Diagnosis not present

## 2023-07-12 DIAGNOSIS — E440 Moderate protein-calorie malnutrition: Secondary | ICD-10-CM | POA: Diagnosis not present

## 2023-07-12 DIAGNOSIS — D696 Thrombocytopenia, unspecified: Secondary | ICD-10-CM | POA: Diagnosis not present

## 2023-07-18 DIAGNOSIS — Z8719 Personal history of other diseases of the digestive system: Secondary | ICD-10-CM | POA: Diagnosis not present

## 2023-07-18 DIAGNOSIS — E440 Moderate protein-calorie malnutrition: Secondary | ICD-10-CM | POA: Diagnosis not present

## 2023-07-18 DIAGNOSIS — C221 Intrahepatic bile duct carcinoma: Secondary | ICD-10-CM | POA: Diagnosis not present

## 2023-07-18 DIAGNOSIS — D696 Thrombocytopenia, unspecified: Secondary | ICD-10-CM | POA: Diagnosis not present

## 2023-07-18 DIAGNOSIS — Z87891 Personal history of nicotine dependence: Secondary | ICD-10-CM | POA: Diagnosis not present

## 2023-07-18 DIAGNOSIS — F0390 Unspecified dementia without behavioral disturbance: Secondary | ICD-10-CM | POA: Diagnosis not present

## 2023-07-23 DIAGNOSIS — E440 Moderate protein-calorie malnutrition: Secondary | ICD-10-CM | POA: Diagnosis not present

## 2023-07-23 DIAGNOSIS — Z8719 Personal history of other diseases of the digestive system: Secondary | ICD-10-CM | POA: Diagnosis not present

## 2023-07-23 DIAGNOSIS — F0390 Unspecified dementia without behavioral disturbance: Secondary | ICD-10-CM | POA: Diagnosis not present

## 2023-07-23 DIAGNOSIS — C221 Intrahepatic bile duct carcinoma: Secondary | ICD-10-CM | POA: Diagnosis not present

## 2023-07-23 DIAGNOSIS — D696 Thrombocytopenia, unspecified: Secondary | ICD-10-CM | POA: Diagnosis not present

## 2023-07-23 DIAGNOSIS — Z87891 Personal history of nicotine dependence: Secondary | ICD-10-CM | POA: Diagnosis not present

## 2023-07-23 DIAGNOSIS — I350 Nonrheumatic aortic (valve) stenosis: Secondary | ICD-10-CM | POA: Diagnosis not present

## 2023-07-23 DIAGNOSIS — E039 Hypothyroidism, unspecified: Secondary | ICD-10-CM | POA: Diagnosis not present

## 2023-07-23 DIAGNOSIS — M1990 Unspecified osteoarthritis, unspecified site: Secondary | ICD-10-CM | POA: Diagnosis not present

## 2023-07-26 DIAGNOSIS — F0390 Unspecified dementia without behavioral disturbance: Secondary | ICD-10-CM | POA: Diagnosis not present

## 2023-07-26 DIAGNOSIS — Z8719 Personal history of other diseases of the digestive system: Secondary | ICD-10-CM | POA: Diagnosis not present

## 2023-07-26 DIAGNOSIS — E440 Moderate protein-calorie malnutrition: Secondary | ICD-10-CM | POA: Diagnosis not present

## 2023-07-26 DIAGNOSIS — D696 Thrombocytopenia, unspecified: Secondary | ICD-10-CM | POA: Diagnosis not present

## 2023-07-26 DIAGNOSIS — C221 Intrahepatic bile duct carcinoma: Secondary | ICD-10-CM | POA: Diagnosis not present

## 2023-07-26 DIAGNOSIS — Z87891 Personal history of nicotine dependence: Secondary | ICD-10-CM | POA: Diagnosis not present

## 2023-08-02 DIAGNOSIS — C221 Intrahepatic bile duct carcinoma: Secondary | ICD-10-CM | POA: Diagnosis not present

## 2023-08-02 DIAGNOSIS — Z87891 Personal history of nicotine dependence: Secondary | ICD-10-CM | POA: Diagnosis not present

## 2023-08-02 DIAGNOSIS — D696 Thrombocytopenia, unspecified: Secondary | ICD-10-CM | POA: Diagnosis not present

## 2023-08-02 DIAGNOSIS — Z8719 Personal history of other diseases of the digestive system: Secondary | ICD-10-CM | POA: Diagnosis not present

## 2023-08-02 DIAGNOSIS — E440 Moderate protein-calorie malnutrition: Secondary | ICD-10-CM | POA: Diagnosis not present

## 2023-08-02 DIAGNOSIS — F0390 Unspecified dementia without behavioral disturbance: Secondary | ICD-10-CM | POA: Diagnosis not present

## 2023-08-09 DIAGNOSIS — F0390 Unspecified dementia without behavioral disturbance: Secondary | ICD-10-CM | POA: Diagnosis not present

## 2023-08-09 DIAGNOSIS — D696 Thrombocytopenia, unspecified: Secondary | ICD-10-CM | POA: Diagnosis not present

## 2023-08-09 DIAGNOSIS — Z8719 Personal history of other diseases of the digestive system: Secondary | ICD-10-CM | POA: Diagnosis not present

## 2023-08-09 DIAGNOSIS — E440 Moderate protein-calorie malnutrition: Secondary | ICD-10-CM | POA: Diagnosis not present

## 2023-08-09 DIAGNOSIS — C221 Intrahepatic bile duct carcinoma: Secondary | ICD-10-CM | POA: Diagnosis not present

## 2023-08-09 DIAGNOSIS — Z87891 Personal history of nicotine dependence: Secondary | ICD-10-CM | POA: Diagnosis not present

## 2023-08-16 DIAGNOSIS — Z8719 Personal history of other diseases of the digestive system: Secondary | ICD-10-CM | POA: Diagnosis not present

## 2023-08-16 DIAGNOSIS — F0390 Unspecified dementia without behavioral disturbance: Secondary | ICD-10-CM | POA: Diagnosis not present

## 2023-08-16 DIAGNOSIS — D696 Thrombocytopenia, unspecified: Secondary | ICD-10-CM | POA: Diagnosis not present

## 2023-08-16 DIAGNOSIS — E440 Moderate protein-calorie malnutrition: Secondary | ICD-10-CM | POA: Diagnosis not present

## 2023-08-16 DIAGNOSIS — C221 Intrahepatic bile duct carcinoma: Secondary | ICD-10-CM | POA: Diagnosis not present

## 2023-08-16 DIAGNOSIS — Z87891 Personal history of nicotine dependence: Secondary | ICD-10-CM | POA: Diagnosis not present

## 2023-08-22 DIAGNOSIS — Z87891 Personal history of nicotine dependence: Secondary | ICD-10-CM | POA: Diagnosis not present

## 2023-08-22 DIAGNOSIS — E039 Hypothyroidism, unspecified: Secondary | ICD-10-CM | POA: Diagnosis not present

## 2023-08-22 DIAGNOSIS — C221 Intrahepatic bile duct carcinoma: Secondary | ICD-10-CM | POA: Diagnosis not present

## 2023-08-22 DIAGNOSIS — D696 Thrombocytopenia, unspecified: Secondary | ICD-10-CM | POA: Diagnosis not present

## 2023-08-22 DIAGNOSIS — I350 Nonrheumatic aortic (valve) stenosis: Secondary | ICD-10-CM | POA: Diagnosis not present

## 2023-08-22 DIAGNOSIS — F0390 Unspecified dementia without behavioral disturbance: Secondary | ICD-10-CM | POA: Diagnosis not present

## 2023-08-22 DIAGNOSIS — Z8719 Personal history of other diseases of the digestive system: Secondary | ICD-10-CM | POA: Diagnosis not present

## 2023-08-22 DIAGNOSIS — M1990 Unspecified osteoarthritis, unspecified site: Secondary | ICD-10-CM | POA: Diagnosis not present

## 2023-08-22 DIAGNOSIS — E440 Moderate protein-calorie malnutrition: Secondary | ICD-10-CM | POA: Diagnosis not present

## 2023-08-23 DIAGNOSIS — E440 Moderate protein-calorie malnutrition: Secondary | ICD-10-CM | POA: Diagnosis not present

## 2023-08-23 DIAGNOSIS — Z87891 Personal history of nicotine dependence: Secondary | ICD-10-CM | POA: Diagnosis not present

## 2023-08-23 DIAGNOSIS — F0390 Unspecified dementia without behavioral disturbance: Secondary | ICD-10-CM | POA: Diagnosis not present

## 2023-08-23 DIAGNOSIS — D696 Thrombocytopenia, unspecified: Secondary | ICD-10-CM | POA: Diagnosis not present

## 2023-08-23 DIAGNOSIS — Z8719 Personal history of other diseases of the digestive system: Secondary | ICD-10-CM | POA: Diagnosis not present

## 2023-08-23 DIAGNOSIS — C221 Intrahepatic bile duct carcinoma: Secondary | ICD-10-CM | POA: Diagnosis not present

## 2023-08-30 DIAGNOSIS — F0390 Unspecified dementia without behavioral disturbance: Secondary | ICD-10-CM | POA: Diagnosis not present

## 2023-08-30 DIAGNOSIS — E440 Moderate protein-calorie malnutrition: Secondary | ICD-10-CM | POA: Diagnosis not present

## 2023-08-30 DIAGNOSIS — Z8719 Personal history of other diseases of the digestive system: Secondary | ICD-10-CM | POA: Diagnosis not present

## 2023-08-30 DIAGNOSIS — Z87891 Personal history of nicotine dependence: Secondary | ICD-10-CM | POA: Diagnosis not present

## 2023-08-30 DIAGNOSIS — D696 Thrombocytopenia, unspecified: Secondary | ICD-10-CM | POA: Diagnosis not present

## 2023-08-30 DIAGNOSIS — C221 Intrahepatic bile duct carcinoma: Secondary | ICD-10-CM | POA: Diagnosis not present

## 2023-09-02 DIAGNOSIS — E440 Moderate protein-calorie malnutrition: Secondary | ICD-10-CM | POA: Diagnosis not present

## 2023-09-02 DIAGNOSIS — C221 Intrahepatic bile duct carcinoma: Secondary | ICD-10-CM | POA: Diagnosis not present

## 2023-09-02 DIAGNOSIS — F0390 Unspecified dementia without behavioral disturbance: Secondary | ICD-10-CM | POA: Diagnosis not present

## 2023-09-02 DIAGNOSIS — Z8719 Personal history of other diseases of the digestive system: Secondary | ICD-10-CM | POA: Diagnosis not present

## 2023-09-02 DIAGNOSIS — D696 Thrombocytopenia, unspecified: Secondary | ICD-10-CM | POA: Diagnosis not present

## 2023-09-02 DIAGNOSIS — Z87891 Personal history of nicotine dependence: Secondary | ICD-10-CM | POA: Diagnosis not present

## 2023-09-06 DIAGNOSIS — F0390 Unspecified dementia without behavioral disturbance: Secondary | ICD-10-CM | POA: Diagnosis not present

## 2023-09-06 DIAGNOSIS — C221 Intrahepatic bile duct carcinoma: Secondary | ICD-10-CM | POA: Diagnosis not present

## 2023-09-06 DIAGNOSIS — Z8719 Personal history of other diseases of the digestive system: Secondary | ICD-10-CM | POA: Diagnosis not present

## 2023-09-06 DIAGNOSIS — D696 Thrombocytopenia, unspecified: Secondary | ICD-10-CM | POA: Diagnosis not present

## 2023-09-06 DIAGNOSIS — Z87891 Personal history of nicotine dependence: Secondary | ICD-10-CM | POA: Diagnosis not present

## 2023-09-06 DIAGNOSIS — E440 Moderate protein-calorie malnutrition: Secondary | ICD-10-CM | POA: Diagnosis not present

## 2023-09-11 DIAGNOSIS — Z87891 Personal history of nicotine dependence: Secondary | ICD-10-CM | POA: Diagnosis not present

## 2023-09-11 DIAGNOSIS — C221 Intrahepatic bile duct carcinoma: Secondary | ICD-10-CM | POA: Diagnosis not present

## 2023-09-11 DIAGNOSIS — F0390 Unspecified dementia without behavioral disturbance: Secondary | ICD-10-CM | POA: Diagnosis not present

## 2023-09-11 DIAGNOSIS — E440 Moderate protein-calorie malnutrition: Secondary | ICD-10-CM | POA: Diagnosis not present

## 2023-09-11 DIAGNOSIS — Z8719 Personal history of other diseases of the digestive system: Secondary | ICD-10-CM | POA: Diagnosis not present

## 2023-09-11 DIAGNOSIS — D696 Thrombocytopenia, unspecified: Secondary | ICD-10-CM | POA: Diagnosis not present

## 2023-09-13 DIAGNOSIS — C221 Intrahepatic bile duct carcinoma: Secondary | ICD-10-CM | POA: Diagnosis not present

## 2023-09-13 DIAGNOSIS — Z8719 Personal history of other diseases of the digestive system: Secondary | ICD-10-CM | POA: Diagnosis not present

## 2023-09-13 DIAGNOSIS — D696 Thrombocytopenia, unspecified: Secondary | ICD-10-CM | POA: Diagnosis not present

## 2023-09-13 DIAGNOSIS — E440 Moderate protein-calorie malnutrition: Secondary | ICD-10-CM | POA: Diagnosis not present

## 2023-09-13 DIAGNOSIS — Z87891 Personal history of nicotine dependence: Secondary | ICD-10-CM | POA: Diagnosis not present

## 2023-09-13 DIAGNOSIS — F0390 Unspecified dementia without behavioral disturbance: Secondary | ICD-10-CM | POA: Diagnosis not present

## 2023-09-18 DIAGNOSIS — D696 Thrombocytopenia, unspecified: Secondary | ICD-10-CM | POA: Diagnosis not present

## 2023-09-18 DIAGNOSIS — C221 Intrahepatic bile duct carcinoma: Secondary | ICD-10-CM | POA: Diagnosis not present

## 2023-09-18 DIAGNOSIS — Z8719 Personal history of other diseases of the digestive system: Secondary | ICD-10-CM | POA: Diagnosis not present

## 2023-09-18 DIAGNOSIS — F0390 Unspecified dementia without behavioral disturbance: Secondary | ICD-10-CM | POA: Diagnosis not present

## 2023-09-18 DIAGNOSIS — Z87891 Personal history of nicotine dependence: Secondary | ICD-10-CM | POA: Diagnosis not present

## 2023-09-18 DIAGNOSIS — E440 Moderate protein-calorie malnutrition: Secondary | ICD-10-CM | POA: Diagnosis not present

## 2023-09-22 DIAGNOSIS — Z87891 Personal history of nicotine dependence: Secondary | ICD-10-CM | POA: Diagnosis not present

## 2023-09-22 DIAGNOSIS — D696 Thrombocytopenia, unspecified: Secondary | ICD-10-CM | POA: Diagnosis not present

## 2023-09-22 DIAGNOSIS — E039 Hypothyroidism, unspecified: Secondary | ICD-10-CM | POA: Diagnosis not present

## 2023-09-22 DIAGNOSIS — Z8719 Personal history of other diseases of the digestive system: Secondary | ICD-10-CM | POA: Diagnosis not present

## 2023-09-22 DIAGNOSIS — C221 Intrahepatic bile duct carcinoma: Secondary | ICD-10-CM | POA: Diagnosis not present

## 2023-09-22 DIAGNOSIS — F0390 Unspecified dementia without behavioral disturbance: Secondary | ICD-10-CM | POA: Diagnosis not present

## 2023-09-22 DIAGNOSIS — I350 Nonrheumatic aortic (valve) stenosis: Secondary | ICD-10-CM | POA: Diagnosis not present

## 2023-09-22 DIAGNOSIS — E440 Moderate protein-calorie malnutrition: Secondary | ICD-10-CM | POA: Diagnosis not present

## 2023-09-22 DIAGNOSIS — M1990 Unspecified osteoarthritis, unspecified site: Secondary | ICD-10-CM | POA: Diagnosis not present

## 2023-09-24 DIAGNOSIS — F0390 Unspecified dementia without behavioral disturbance: Secondary | ICD-10-CM | POA: Diagnosis not present

## 2023-09-24 DIAGNOSIS — E440 Moderate protein-calorie malnutrition: Secondary | ICD-10-CM | POA: Diagnosis not present

## 2023-09-24 DIAGNOSIS — Z87891 Personal history of nicotine dependence: Secondary | ICD-10-CM | POA: Diagnosis not present

## 2023-09-24 DIAGNOSIS — D696 Thrombocytopenia, unspecified: Secondary | ICD-10-CM | POA: Diagnosis not present

## 2023-09-24 DIAGNOSIS — Z8719 Personal history of other diseases of the digestive system: Secondary | ICD-10-CM | POA: Diagnosis not present

## 2023-09-24 DIAGNOSIS — C221 Intrahepatic bile duct carcinoma: Secondary | ICD-10-CM | POA: Diagnosis not present

## 2023-09-27 DIAGNOSIS — E440 Moderate protein-calorie malnutrition: Secondary | ICD-10-CM | POA: Diagnosis not present

## 2023-09-27 DIAGNOSIS — Z8719 Personal history of other diseases of the digestive system: Secondary | ICD-10-CM | POA: Diagnosis not present

## 2023-09-27 DIAGNOSIS — F0390 Unspecified dementia without behavioral disturbance: Secondary | ICD-10-CM | POA: Diagnosis not present

## 2023-09-27 DIAGNOSIS — D696 Thrombocytopenia, unspecified: Secondary | ICD-10-CM | POA: Diagnosis not present

## 2023-09-27 DIAGNOSIS — Z87891 Personal history of nicotine dependence: Secondary | ICD-10-CM | POA: Diagnosis not present

## 2023-09-27 DIAGNOSIS — C221 Intrahepatic bile duct carcinoma: Secondary | ICD-10-CM | POA: Diagnosis not present

## 2023-09-30 DIAGNOSIS — C221 Intrahepatic bile duct carcinoma: Secondary | ICD-10-CM | POA: Diagnosis not present

## 2023-09-30 DIAGNOSIS — E440 Moderate protein-calorie malnutrition: Secondary | ICD-10-CM | POA: Diagnosis not present

## 2023-09-30 DIAGNOSIS — Z87891 Personal history of nicotine dependence: Secondary | ICD-10-CM | POA: Diagnosis not present

## 2023-09-30 DIAGNOSIS — F0390 Unspecified dementia without behavioral disturbance: Secondary | ICD-10-CM | POA: Diagnosis not present

## 2023-09-30 DIAGNOSIS — D696 Thrombocytopenia, unspecified: Secondary | ICD-10-CM | POA: Diagnosis not present

## 2023-09-30 DIAGNOSIS — Z8719 Personal history of other diseases of the digestive system: Secondary | ICD-10-CM | POA: Diagnosis not present

## 2023-10-01 DIAGNOSIS — Z87891 Personal history of nicotine dependence: Secondary | ICD-10-CM | POA: Diagnosis not present

## 2023-10-01 DIAGNOSIS — Z8719 Personal history of other diseases of the digestive system: Secondary | ICD-10-CM | POA: Diagnosis not present

## 2023-10-01 DIAGNOSIS — C221 Intrahepatic bile duct carcinoma: Secondary | ICD-10-CM | POA: Diagnosis not present

## 2023-10-01 DIAGNOSIS — E440 Moderate protein-calorie malnutrition: Secondary | ICD-10-CM | POA: Diagnosis not present

## 2023-10-01 DIAGNOSIS — F0390 Unspecified dementia without behavioral disturbance: Secondary | ICD-10-CM | POA: Diagnosis not present

## 2023-10-01 DIAGNOSIS — D696 Thrombocytopenia, unspecified: Secondary | ICD-10-CM | POA: Diagnosis not present

## 2023-10-03 DIAGNOSIS — D696 Thrombocytopenia, unspecified: Secondary | ICD-10-CM | POA: Diagnosis not present

## 2023-10-03 DIAGNOSIS — Z8719 Personal history of other diseases of the digestive system: Secondary | ICD-10-CM | POA: Diagnosis not present

## 2023-10-03 DIAGNOSIS — E440 Moderate protein-calorie malnutrition: Secondary | ICD-10-CM | POA: Diagnosis not present

## 2023-10-03 DIAGNOSIS — Z87891 Personal history of nicotine dependence: Secondary | ICD-10-CM | POA: Diagnosis not present

## 2023-10-03 DIAGNOSIS — C221 Intrahepatic bile duct carcinoma: Secondary | ICD-10-CM | POA: Diagnosis not present

## 2023-10-03 DIAGNOSIS — F0390 Unspecified dementia without behavioral disturbance: Secondary | ICD-10-CM | POA: Diagnosis not present

## 2023-10-04 DIAGNOSIS — Z8719 Personal history of other diseases of the digestive system: Secondary | ICD-10-CM | POA: Diagnosis not present

## 2023-10-04 DIAGNOSIS — C221 Intrahepatic bile duct carcinoma: Secondary | ICD-10-CM | POA: Diagnosis not present

## 2023-10-04 DIAGNOSIS — E440 Moderate protein-calorie malnutrition: Secondary | ICD-10-CM | POA: Diagnosis not present

## 2023-10-04 DIAGNOSIS — Z87891 Personal history of nicotine dependence: Secondary | ICD-10-CM | POA: Diagnosis not present

## 2023-10-04 DIAGNOSIS — D696 Thrombocytopenia, unspecified: Secondary | ICD-10-CM | POA: Diagnosis not present

## 2023-10-04 DIAGNOSIS — F0390 Unspecified dementia without behavioral disturbance: Secondary | ICD-10-CM | POA: Diagnosis not present

## 2023-10-05 DIAGNOSIS — E440 Moderate protein-calorie malnutrition: Secondary | ICD-10-CM | POA: Diagnosis not present

## 2023-10-05 DIAGNOSIS — F0390 Unspecified dementia without behavioral disturbance: Secondary | ICD-10-CM | POA: Diagnosis not present

## 2023-10-05 DIAGNOSIS — Z8719 Personal history of other diseases of the digestive system: Secondary | ICD-10-CM | POA: Diagnosis not present

## 2023-10-05 DIAGNOSIS — C221 Intrahepatic bile duct carcinoma: Secondary | ICD-10-CM | POA: Diagnosis not present

## 2023-10-05 DIAGNOSIS — Z87891 Personal history of nicotine dependence: Secondary | ICD-10-CM | POA: Diagnosis not present

## 2023-10-05 DIAGNOSIS — D696 Thrombocytopenia, unspecified: Secondary | ICD-10-CM | POA: Diagnosis not present

## 2023-10-07 DIAGNOSIS — F0390 Unspecified dementia without behavioral disturbance: Secondary | ICD-10-CM | POA: Diagnosis not present

## 2023-10-07 DIAGNOSIS — E440 Moderate protein-calorie malnutrition: Secondary | ICD-10-CM | POA: Diagnosis not present

## 2023-10-07 DIAGNOSIS — Z87891 Personal history of nicotine dependence: Secondary | ICD-10-CM | POA: Diagnosis not present

## 2023-10-07 DIAGNOSIS — Z8719 Personal history of other diseases of the digestive system: Secondary | ICD-10-CM | POA: Diagnosis not present

## 2023-10-07 DIAGNOSIS — C221 Intrahepatic bile duct carcinoma: Secondary | ICD-10-CM | POA: Diagnosis not present

## 2023-10-07 DIAGNOSIS — D696 Thrombocytopenia, unspecified: Secondary | ICD-10-CM | POA: Diagnosis not present

## 2023-10-08 DIAGNOSIS — E440 Moderate protein-calorie malnutrition: Secondary | ICD-10-CM | POA: Diagnosis not present

## 2023-10-08 DIAGNOSIS — Z8719 Personal history of other diseases of the digestive system: Secondary | ICD-10-CM | POA: Diagnosis not present

## 2023-10-08 DIAGNOSIS — D696 Thrombocytopenia, unspecified: Secondary | ICD-10-CM | POA: Diagnosis not present

## 2023-10-08 DIAGNOSIS — Z87891 Personal history of nicotine dependence: Secondary | ICD-10-CM | POA: Diagnosis not present

## 2023-10-08 DIAGNOSIS — C221 Intrahepatic bile duct carcinoma: Secondary | ICD-10-CM | POA: Diagnosis not present

## 2023-10-08 DIAGNOSIS — F0390 Unspecified dementia without behavioral disturbance: Secondary | ICD-10-CM | POA: Diagnosis not present

## 2023-10-09 DIAGNOSIS — Z87891 Personal history of nicotine dependence: Secondary | ICD-10-CM | POA: Diagnosis not present

## 2023-10-09 DIAGNOSIS — F0390 Unspecified dementia without behavioral disturbance: Secondary | ICD-10-CM | POA: Diagnosis not present

## 2023-10-09 DIAGNOSIS — D696 Thrombocytopenia, unspecified: Secondary | ICD-10-CM | POA: Diagnosis not present

## 2023-10-09 DIAGNOSIS — E440 Moderate protein-calorie malnutrition: Secondary | ICD-10-CM | POA: Diagnosis not present

## 2023-10-09 DIAGNOSIS — C221 Intrahepatic bile duct carcinoma: Secondary | ICD-10-CM | POA: Diagnosis not present

## 2023-10-09 DIAGNOSIS — Z8719 Personal history of other diseases of the digestive system: Secondary | ICD-10-CM | POA: Diagnosis not present

## 2023-10-10 DIAGNOSIS — Z87891 Personal history of nicotine dependence: Secondary | ICD-10-CM | POA: Diagnosis not present

## 2023-10-10 DIAGNOSIS — C221 Intrahepatic bile duct carcinoma: Secondary | ICD-10-CM | POA: Diagnosis not present

## 2023-10-10 DIAGNOSIS — F0390 Unspecified dementia without behavioral disturbance: Secondary | ICD-10-CM | POA: Diagnosis not present

## 2023-10-10 DIAGNOSIS — Z8719 Personal history of other diseases of the digestive system: Secondary | ICD-10-CM | POA: Diagnosis not present

## 2023-10-10 DIAGNOSIS — E440 Moderate protein-calorie malnutrition: Secondary | ICD-10-CM | POA: Diagnosis not present

## 2023-10-10 DIAGNOSIS — D696 Thrombocytopenia, unspecified: Secondary | ICD-10-CM | POA: Diagnosis not present

## 2023-10-11 DIAGNOSIS — D696 Thrombocytopenia, unspecified: Secondary | ICD-10-CM | POA: Diagnosis not present

## 2023-10-11 DIAGNOSIS — Z8719 Personal history of other diseases of the digestive system: Secondary | ICD-10-CM | POA: Diagnosis not present

## 2023-10-11 DIAGNOSIS — Z87891 Personal history of nicotine dependence: Secondary | ICD-10-CM | POA: Diagnosis not present

## 2023-10-11 DIAGNOSIS — E440 Moderate protein-calorie malnutrition: Secondary | ICD-10-CM | POA: Diagnosis not present

## 2023-10-11 DIAGNOSIS — F0390 Unspecified dementia without behavioral disturbance: Secondary | ICD-10-CM | POA: Diagnosis not present

## 2023-10-11 DIAGNOSIS — C221 Intrahepatic bile duct carcinoma: Secondary | ICD-10-CM | POA: Diagnosis not present

## 2023-10-14 DIAGNOSIS — Z87891 Personal history of nicotine dependence: Secondary | ICD-10-CM | POA: Diagnosis not present

## 2023-10-14 DIAGNOSIS — Z8719 Personal history of other diseases of the digestive system: Secondary | ICD-10-CM | POA: Diagnosis not present

## 2023-10-14 DIAGNOSIS — C221 Intrahepatic bile duct carcinoma: Secondary | ICD-10-CM | POA: Diagnosis not present

## 2023-10-14 DIAGNOSIS — E440 Moderate protein-calorie malnutrition: Secondary | ICD-10-CM | POA: Diagnosis not present

## 2023-10-14 DIAGNOSIS — F0390 Unspecified dementia without behavioral disturbance: Secondary | ICD-10-CM | POA: Diagnosis not present

## 2023-10-14 DIAGNOSIS — D696 Thrombocytopenia, unspecified: Secondary | ICD-10-CM | POA: Diagnosis not present

## 2023-10-15 DIAGNOSIS — Z87891 Personal history of nicotine dependence: Secondary | ICD-10-CM | POA: Diagnosis not present

## 2023-10-15 DIAGNOSIS — Z8719 Personal history of other diseases of the digestive system: Secondary | ICD-10-CM | POA: Diagnosis not present

## 2023-10-15 DIAGNOSIS — E440 Moderate protein-calorie malnutrition: Secondary | ICD-10-CM | POA: Diagnosis not present

## 2023-10-15 DIAGNOSIS — D696 Thrombocytopenia, unspecified: Secondary | ICD-10-CM | POA: Diagnosis not present

## 2023-10-15 DIAGNOSIS — F0390 Unspecified dementia without behavioral disturbance: Secondary | ICD-10-CM | POA: Diagnosis not present

## 2023-10-15 DIAGNOSIS — C221 Intrahepatic bile duct carcinoma: Secondary | ICD-10-CM | POA: Diagnosis not present

## 2023-10-17 DIAGNOSIS — D696 Thrombocytopenia, unspecified: Secondary | ICD-10-CM | POA: Diagnosis not present

## 2023-10-17 DIAGNOSIS — F0390 Unspecified dementia without behavioral disturbance: Secondary | ICD-10-CM | POA: Diagnosis not present

## 2023-10-17 DIAGNOSIS — E440 Moderate protein-calorie malnutrition: Secondary | ICD-10-CM | POA: Diagnosis not present

## 2023-10-17 DIAGNOSIS — Z8719 Personal history of other diseases of the digestive system: Secondary | ICD-10-CM | POA: Diagnosis not present

## 2023-10-17 DIAGNOSIS — C221 Intrahepatic bile duct carcinoma: Secondary | ICD-10-CM | POA: Diagnosis not present

## 2023-10-17 DIAGNOSIS — Z87891 Personal history of nicotine dependence: Secondary | ICD-10-CM | POA: Diagnosis not present

## 2023-10-18 DIAGNOSIS — F0390 Unspecified dementia without behavioral disturbance: Secondary | ICD-10-CM | POA: Diagnosis not present

## 2023-10-18 DIAGNOSIS — D696 Thrombocytopenia, unspecified: Secondary | ICD-10-CM | POA: Diagnosis not present

## 2023-10-18 DIAGNOSIS — E440 Moderate protein-calorie malnutrition: Secondary | ICD-10-CM | POA: Diagnosis not present

## 2023-10-18 DIAGNOSIS — Z8719 Personal history of other diseases of the digestive system: Secondary | ICD-10-CM | POA: Diagnosis not present

## 2023-10-18 DIAGNOSIS — Z87891 Personal history of nicotine dependence: Secondary | ICD-10-CM | POA: Diagnosis not present

## 2023-10-18 DIAGNOSIS — C221 Intrahepatic bile duct carcinoma: Secondary | ICD-10-CM | POA: Diagnosis not present

## 2023-10-21 DIAGNOSIS — C221 Intrahepatic bile duct carcinoma: Secondary | ICD-10-CM | POA: Diagnosis not present

## 2023-10-21 DIAGNOSIS — Z8719 Personal history of other diseases of the digestive system: Secondary | ICD-10-CM | POA: Diagnosis not present

## 2023-10-21 DIAGNOSIS — E440 Moderate protein-calorie malnutrition: Secondary | ICD-10-CM | POA: Diagnosis not present

## 2023-10-21 DIAGNOSIS — D696 Thrombocytopenia, unspecified: Secondary | ICD-10-CM | POA: Diagnosis not present

## 2023-10-21 DIAGNOSIS — F0390 Unspecified dementia without behavioral disturbance: Secondary | ICD-10-CM | POA: Diagnosis not present

## 2023-10-21 DIAGNOSIS — Z87891 Personal history of nicotine dependence: Secondary | ICD-10-CM | POA: Diagnosis not present

## 2023-10-22 DIAGNOSIS — Z87891 Personal history of nicotine dependence: Secondary | ICD-10-CM | POA: Diagnosis not present

## 2023-10-22 DIAGNOSIS — M1990 Unspecified osteoarthritis, unspecified site: Secondary | ICD-10-CM | POA: Diagnosis not present

## 2023-10-22 DIAGNOSIS — F0390 Unspecified dementia without behavioral disturbance: Secondary | ICD-10-CM | POA: Diagnosis not present

## 2023-10-22 DIAGNOSIS — E440 Moderate protein-calorie malnutrition: Secondary | ICD-10-CM | POA: Diagnosis not present

## 2023-10-22 DIAGNOSIS — D696 Thrombocytopenia, unspecified: Secondary | ICD-10-CM | POA: Diagnosis not present

## 2023-10-22 DIAGNOSIS — Z8719 Personal history of other diseases of the digestive system: Secondary | ICD-10-CM | POA: Diagnosis not present

## 2023-10-22 DIAGNOSIS — I350 Nonrheumatic aortic (valve) stenosis: Secondary | ICD-10-CM | POA: Diagnosis not present

## 2023-10-22 DIAGNOSIS — C221 Intrahepatic bile duct carcinoma: Secondary | ICD-10-CM | POA: Diagnosis not present

## 2023-10-22 DIAGNOSIS — E039 Hypothyroidism, unspecified: Secondary | ICD-10-CM | POA: Diagnosis not present

## 2023-10-24 DIAGNOSIS — F0390 Unspecified dementia without behavioral disturbance: Secondary | ICD-10-CM | POA: Diagnosis not present

## 2023-10-24 DIAGNOSIS — E440 Moderate protein-calorie malnutrition: Secondary | ICD-10-CM | POA: Diagnosis not present

## 2023-10-24 DIAGNOSIS — D696 Thrombocytopenia, unspecified: Secondary | ICD-10-CM | POA: Diagnosis not present

## 2023-10-24 DIAGNOSIS — Z8719 Personal history of other diseases of the digestive system: Secondary | ICD-10-CM | POA: Diagnosis not present

## 2023-10-24 DIAGNOSIS — Z87891 Personal history of nicotine dependence: Secondary | ICD-10-CM | POA: Diagnosis not present

## 2023-10-24 DIAGNOSIS — C221 Intrahepatic bile duct carcinoma: Secondary | ICD-10-CM | POA: Diagnosis not present

## 2023-11-22 DEATH — deceased
